# Patient Record
Sex: Female | Born: 1955 | Race: White | Hispanic: No | Smoking: Never smoker
Health system: Southern US, Community
[De-identification: ages and names within clinical notes are randomized; demographics above are authoritative.]

## PROBLEM LIST (undated history)

## (undated) DIAGNOSIS — E785 Hyperlipidemia, unspecified: Secondary | ICD-10-CM

## (undated) DIAGNOSIS — F329 Major depressive disorder, single episode, unspecified: Secondary | ICD-10-CM

## (undated) DIAGNOSIS — K219 Gastro-esophageal reflux disease without esophagitis: Secondary | ICD-10-CM

## (undated) DIAGNOSIS — E039 Hypothyroidism, unspecified: Secondary | ICD-10-CM

## (undated) DIAGNOSIS — F32A Depression, unspecified: Secondary | ICD-10-CM

## (undated) DIAGNOSIS — L720 Epidermal cyst: Secondary | ICD-10-CM

## (undated) HISTORY — DX: Epidermal cyst: L72.0

## (undated) HISTORY — DX: Depression, unspecified: F32.A

## (undated) HISTORY — PX: INNER EAR SURGERY: SHX679

## (undated) HISTORY — DX: Hypothyroidism, unspecified: E03.9

## (undated) HISTORY — DX: Major depressive disorder, single episode, unspecified: F32.9

## (undated) HISTORY — DX: Gastro-esophageal reflux disease without esophagitis: K21.9

## (undated) HISTORY — DX: Hyperlipidemia, unspecified: E78.5

---

## 1998-02-01 ENCOUNTER — Inpatient Hospital Stay (HOSPITAL_COMMUNITY): Admission: EM | Admit: 1998-02-01 | Discharge: 1998-02-08 | Payer: Self-pay | Admitting: Emergency Medicine

## 2000-02-01 ENCOUNTER — Inpatient Hospital Stay (HOSPITAL_COMMUNITY): Admission: EM | Admit: 2000-02-01 | Discharge: 2000-02-04 | Payer: Self-pay | Admitting: *Deleted

## 2000-04-07 ENCOUNTER — Inpatient Hospital Stay (HOSPITAL_COMMUNITY): Admission: EM | Admit: 2000-04-07 | Discharge: 2000-04-11 | Payer: Self-pay | Admitting: *Deleted

## 2000-06-12 ENCOUNTER — Other Ambulatory Visit: Admission: RE | Admit: 2000-06-12 | Discharge: 2000-06-12 | Payer: Self-pay | Admitting: Gynecology

## 2000-06-18 ENCOUNTER — Ambulatory Visit (HOSPITAL_COMMUNITY): Admission: RE | Admit: 2000-06-18 | Discharge: 2000-06-18 | Payer: Self-pay | Admitting: Gastroenterology

## 2000-08-14 ENCOUNTER — Other Ambulatory Visit: Admission: RE | Admit: 2000-08-14 | Discharge: 2000-08-14 | Payer: Self-pay | Admitting: Gynecology

## 2000-10-21 ENCOUNTER — Inpatient Hospital Stay (HOSPITAL_COMMUNITY): Admission: EM | Admit: 2000-10-21 | Discharge: 2000-10-28 | Payer: Self-pay | Admitting: *Deleted

## 2000-10-29 ENCOUNTER — Other Ambulatory Visit (HOSPITAL_COMMUNITY): Admission: RE | Admit: 2000-10-29 | Discharge: 2000-11-10 | Payer: Self-pay | Admitting: Psychiatry

## 2000-12-16 ENCOUNTER — Inpatient Hospital Stay (HOSPITAL_COMMUNITY): Admission: EM | Admit: 2000-12-16 | Discharge: 2000-12-24 | Payer: Self-pay | Admitting: Psychiatry

## 2001-01-21 ENCOUNTER — Inpatient Hospital Stay (HOSPITAL_COMMUNITY): Admission: AD | Admit: 2001-01-21 | Discharge: 2001-01-30 | Payer: Self-pay | Admitting: Psychiatry

## 2001-05-02 ENCOUNTER — Encounter: Payer: Self-pay | Admitting: Emergency Medicine

## 2001-05-02 ENCOUNTER — Emergency Department (HOSPITAL_COMMUNITY): Admission: EM | Admit: 2001-05-02 | Discharge: 2001-05-02 | Payer: Self-pay | Admitting: Emergency Medicine

## 2002-04-21 ENCOUNTER — Encounter: Payer: Self-pay | Admitting: Gynecology

## 2002-04-21 ENCOUNTER — Other Ambulatory Visit: Admission: RE | Admit: 2002-04-21 | Discharge: 2002-04-21 | Payer: Self-pay | Admitting: Gynecology

## 2002-04-21 ENCOUNTER — Encounter: Admission: RE | Admit: 2002-04-21 | Discharge: 2002-04-21 | Payer: Self-pay | Admitting: Gynecology

## 2002-06-24 HISTORY — PX: CHOLECYSTECTOMY: SHX55

## 2002-07-04 ENCOUNTER — Observation Stay (HOSPITAL_COMMUNITY): Admission: EM | Admit: 2002-07-04 | Discharge: 2002-07-05 | Payer: Self-pay | Admitting: Emergency Medicine

## 2002-07-04 ENCOUNTER — Encounter: Payer: Self-pay | Admitting: Emergency Medicine

## 2002-07-04 ENCOUNTER — Encounter (INDEPENDENT_AMBULATORY_CARE_PROVIDER_SITE_OTHER): Payer: Self-pay | Admitting: Specialist

## 2002-07-04 ENCOUNTER — Encounter: Payer: Self-pay | Admitting: General Surgery

## 2004-05-11 ENCOUNTER — Emergency Department (HOSPITAL_COMMUNITY): Admission: EM | Admit: 2004-05-11 | Discharge: 2004-05-11 | Payer: Self-pay | Admitting: Emergency Medicine

## 2004-11-22 ENCOUNTER — Other Ambulatory Visit: Admission: RE | Admit: 2004-11-22 | Discharge: 2004-11-22 | Payer: Self-pay | Admitting: Gynecology

## 2004-12-05 ENCOUNTER — Encounter: Admission: RE | Admit: 2004-12-05 | Discharge: 2004-12-05 | Payer: Self-pay | Admitting: Gynecology

## 2006-06-04 ENCOUNTER — Ambulatory Visit (HOSPITAL_BASED_OUTPATIENT_CLINIC_OR_DEPARTMENT_OTHER): Admission: RE | Admit: 2006-06-04 | Discharge: 2006-06-04 | Payer: Self-pay | Admitting: Orthopedic Surgery

## 2006-11-24 HISTORY — PX: CERVICAL POLYPECTOMY: SHX88

## 2006-12-04 ENCOUNTER — Encounter: Admission: RE | Admit: 2006-12-04 | Discharge: 2006-12-04 | Payer: Self-pay | Admitting: Family Medicine

## 2010-07-30 LAB — HM COLONOSCOPY

## 2010-08-10 NOTE — Discharge Summary (Signed)
[facility]  Patient:    Victoria Morales, Victoria Morales                        MRN: [id number] Adm. Date:  [id number] Disc. Date: [id number] Attending:  Otilio Saber                           Discharge Summary  HISTORY OF PRESENT ILLNESS:  Ms. Wolden is a 55 year old single white female admitted with a history of increasing depression and suicidal ideation.  The patient had a one- to two-year history of difficulties at work and she had become increasingly afraid of being fired.  She had been demoted as Merchandiser, retail two months ago and had been removed from the executive E-mail list within two weeks prior to admission.  She became increasingly anxious and depressed with decreased appetite, increased emotional lability, increased irritability, and crying spells.  She developed hypersomnia and increasing suicidal ideation.  PSYCHIATRIC HISTORY:  Patient had been diagnosed in the late 1980s with severe depression and was treated at [facility] with ECT.  She had multiple admissions to [facility], ______ , [facility], [facility] and was admitted to [facility] in 2001.  She was followed by Dr. Andee Poles, psychiatrist, and Roosvelt Maser who did ___ with her.  She also sees Doran Heater for individual therapy intermittently.  PAST MEDICAL HISTORY:  She was followed medically by Urgent Care.  She had a history of hypothyroidism and gastroesophageal reflux disorder.  MEDICATIONS:  At the time of admission, she was on Synthroid 0.05 mg q.d., Klonopin 1 to 2 mg t.i.d., Xanax 0.5 to 1 mg p.r.n., Neurontin 300 mg t.i.d., Paxil 20 mg q.d., Anafranil 100 mg q.h.s., Skelaxin 400 mg p.r.n., BuSpar 30 mg b.i.d., and Prevacid 30 mg b.i.d.  ALLERGIES:  She had no known drug allergies.  PHYSICAL EXAMINATION ON ADMISSION:  Performed at [facility] with no significant findings.  MENTAL STATUS EXAMINATION:  Overweight white  female who was fidgeting and restless.  Speech was soft and rapid.  Thought processes logical and coherent without evidence of psychosis.  She had suicidal ideation.  Mood was depressed.  Affect was anxious and blunted.  Oriented x 3.  Cognitive function was intact.  ADMITTING DIAGNOSES: Axis I:     Major depression, recurrent, severe. Axis II:    Deferred. Axis III:   1. Hypothyroidism.             2. Gastroesophageal reflux disorder. Axis IV:    Psychosocial stressors moderate. Axis V:     Global Assessment of Functioning current 25, highest past year 65.  LABORATORY FINDINGS:  Admission CBC was normal.  Blood chemistries were normal.  Thyroid panel showed an elevated TSH of 8.54.  Urinalysis was normal. Urine drug screen was negative except for benzodiazepines.  HOSPITAL COURSE:  Patient admitted to [facility] for treatment of her depression.  We elected to continue her on Paxil and Anafranil and added Seroquel to help with her anxiety and agitation.  She continued to remain anxious and was obsessing about her concerns with work.  She felt rather overwhelmed but did seem to calm somewhat with the Seroquel.  She gradually showed improvement in her mood and she became calmer and more stable.  We had increased her Paxil which she tolerated well.  She was denying any further suicidal ideation and was feeling much  better about her situation with work and home.  It is felt that she could be managed again on an outpatient basis.  CONDITION ON DISCHARGE:  Patient discharged in improved condition with improvement in her mood, sleep and appetite, and alleviation of her suicidal ideation.  DISPOSITION:  Patient discharged home.  FOLLOWUP:  She is to follow up with Dr. Andee Poles on April 21, 2000 and with Roosvelt Maser on April 16, 2000.  She is also to meet with Felix Pacini on April 15, 2000.  DISCHARGE MEDICATIONS: 1. Paxil 30 mg q.a.m. 2. Anafranil 100 mg  q.h.s. 3. Klonopin 2 mg b.i.d. 4. Neurontin 300 mg t.i.d. 5. BuSpar 15 mg b.i.d. 6. Seroquel 25 mg q.i.d. 7. Protonix 40 mg a.c. b.i.d. 8. Synthroid 0.05 mg q.d.  FINAL DIAGNOSES: Axis I:     Major depression, recurrent, severe. Axis II:    No diagnosis. Axis III:   1. Hypothyroidism.             2. Gastroesophageal reflux disorder. Axis IV:    Psychosocial stressors moderate. Axis V:     Global Assessment of Functioning current 51, highest past year 65. DD:  05/02/00 TD:  05/03/00 Job: [id number]

## 2010-08-10 NOTE — H&P (Signed)
[facility]  Patient:    Victoria Morales, Victoria Morales Visit Number: [id number] MRN: [id number]          Service Type: PSY Location: 30 1027 25 Attending Physician:  Rachael Fee Dictated by:   Young Berry Scott, N.P. Admit Date:  12/16/2000                     Psychiatric Admission Assessment  REVIEW OF SYSTEMS:  This is an obese middle-aged Caucasian female who is in not acute distress.  She is well-nourished, well-developed, and otherwise appears healthy.  Hygiene is good.  She is fully alert and cooperative with the exam, with no specific somatic complaints today, other than she does feel like she has some motor restlessness in her legs and states "My legs feel like they are moving on their own and sometimes I feel like I have a tremor in my hands, even though you cant see it."  Patients preventive care is up to date with regular mammograms and pap smears.  Patient however reports that she has not had an eye exam in probably more than 10 years.  She has a healing abrasion no her right knee from falling over a pallet at work, and this appears to be healing well.  VITAL SIGNS ON ADMISSION:  Temp 97.4, pulse 107, respirations 20, blood pressure 167/73.  She is 209 pounds and is 5 feet 4-1/2 inches tall.  She does report a history of "borderline hypertension" as reported by the nurse at her work site.  Patient has a history of gastroesophageal reflux disease for which she takes Nexium every day.  PHYSICAL EXAMINATION: SKIN:  Pale in tone with no remarkable scars or features, no tattoos.  Hair is medium brown, cut short up around her ears.  Hair distribution is normal for age and sex.  HEAD:  Normocephalic, no lesions or masses, held upright.  EENT:  PERRLA.  Vessels on bilateral fundi exam show no changes. EACs are patent and TMs show normal light reflex, no injection.  No rhinorrhea.  Oral pharynx is noninjected.  Teeth show evidence of dental work and are  in excellent condition, no breath odor.  Tongue shows fine fasciculations on projection.  NECK:  Supple, no thyromegaly.  CHEST:  Symmetrical.  CARDIOVASCULAR:  S1 and S2 heard, no clicks, murmurs, or gallops, regular rate and rhythm.  LUNGS:  Clear to auscultation.  BREAST EXAM:  Deferred.  ABDOMEN:  Rounded, soft, no masses or tenderness.  Bowel sounds present in all 4 quadrants.  No CVA tenderness, no suprapubic tenderness.  No symptoms of dysuria.  GENITALIA:  Deferred.  MUSCULOSKELETAL:  Gait is normal, posture is upright, spine is straight without tenderness to palpation.  Strength is 5/5 throughout.  Patient is independent in ADLs, ambulates without assistance or devices.  NEURO:  EOMs are intact, with one beat of nystagmus in all directions. Cranial nerves 2-12 are intact and tongue shows fine fasciculations.  No motor tremor is perceptible in hands or arms, however patient does perceive it subjectively.  Deep tendon reflexes are 2+/5.  Cerebellar function is intact for heel-to-shin maneuvers, and Romberg is without findings.  ASSESSMENT:  Patient may be having some mild EPS and we will start some Benadryl on a p.r.n. basis to see if this gives her any relief. Dictated by:   Young Berry Scott, N.P. Attending Physician:  Rachael Fee DD:  12/18/00 TD:  12/18/00 Job: [id number]
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
## 2010-08-10 NOTE — H&P (Signed)
[facility]  Patient:    TOLA, MEAS                        MRN: [id number] Adm. Date:  [id number] Disc. Date: [id number] Attending:  Denny Peon Dictator:   Landry Corporal, N.P.                         History and Physical  IDENTIFYING DATA:  This is a 55 year old single white female admitted on a voluntarily on July 30 for depression, increased anxiety, and suicidal thoughts.  VITAL SIGNS:  She is 5 feet 5 inches tall.  She weighs 206 pound.  Last vital signs:  96.6, heart rate 90, 24 respirations, blood pressure 126/81. GENERAL appearance:  Patient is a 55 year old Caucasian female in no acute distress.  She is obese in stature, appears her stated age.  She is well groomed, alert and cooperative.  HEAD:  Normocephalic.  She can raise her eyebrows.  Her hair is short and of equal distribution.  Her EOMs are intact bilaterally .  Her external ear canals are patent.  Her hearing is appropriate to conversation.  No sinus tenderness, no nasal discharge.  Mucosa is moist, good dentition.  No lesions were seen.  Her tongue protrudes midline without tremor.  She can clench her teeth and puff out her cheeks.  Uvula midline.  NECK:  Supple, full range of motion.  There is no JVD.  Negative lymphadenopathy.  Trachea is midline.  Thyroid is nonpalpable and nontender.  CHEST:  Clear to auscultation.  No adventitious sounds. No cough.  CARDIOVASCULAR:  Heart rate regular rate and rhythm without murmurs, gallops or rubs.  No edema was noted.  BREAST EXAM was deferred.  ABDOMEN:  Soft, obese, nontender abdomen.  Active bowel sounds present.  No CVA tenderness.  MUSCULOSKELETAL:  No joint swelling or deformity.  Good range of motion. Muscle strength and tone is equal bilaterally.  There are no signs of injury.  SKIN:  Warm and dry.  Good turgor.  Nail beds are pink with good capillary refill.  Patient does have some swelling to her right scapular  area that is moveable, nontender, no drainage noted.  NEUROLOGIC:  Oriented x 3.  Cranial nerves are grossly intact.  Good grip strength bilaterally.  No involuntary movements.  Gait is normal.  Cerebellar function is intact.  Romberg is negative.  HEALTH MAINTENANCE ISSUES were addressed. DD:  10/28/00 TD:  10/29/00 Job: [id number]

## 2010-08-10 NOTE — Discharge Summary (Signed)
[facility]  Patient:    CORTASIA, SCREWS                        MRN: [id number] Adm. Date:  [id number] Disc. Date: [id number] Attending:  Otilio Saber                           Discharge Summary  BRIEF HISTORY:  Ms. Maietta is a 55 year old single white female admitted with a history of depression and suicidal ideation.  The patient apparently had been demoted at work and had become increasingly depressed.  She reported feeling worthless and began having suicidal thoughts.  She stated she had been quite worried about getting fired and had felt very stressed and panicky.  She had a history of self-mutilation and previous suicide attempt.  PAST PSYCHIATRIC HISTORY:  The patient had a history of multiple psychiatric hospitalizations, approximately 10, with her last hospitalization being in 3/00 at [facility].  She had previously been to [facility], [facility] in [place], [facility], and was in outpatient therapy at [facility] doing DBT.  She was followed locally by Dr. Phillip Heal and saw Dr. Lottie Dawson, psychologist, for individual therapy.  PAST MEDICAL HISTORY:  She was [facility] medically through [facility]. She has a history of gastroesophageal reflux disease.  At the time of admission she was on Risperdal 1 mg b.i.d., Xanax 0.5 to 1 mg t.i.d. p.r.n., BuSpar 30 mg b.i.d., Anafranil 150 mg q.h.s., Prevacid 30 mg b.i.d., Klonopin 2 mg t.i.d., Depakote ER 1500 mg q.a.m., Neurontin 100 mg b.i.d., Provigil 400 mg q.d.  She reported being allergic to PENICILLIN.  PHYSICAL EXAMINATION:  Performed at [facility] with no significant findings.  MENTAL STATUS EXAMINATION ON ADMISSION:  Overweight white female who was casually dressed.  Speech was normal but slightly pressured.  Thought processes were logical and coherent without evidence of psychosis.  Now denying suicidal ideation.  Mood was anxious.  Affect was anxious.   Oriented x 3.  Cognitive functioning was intact.  ADMITTING DIAGNOSES: Axis I:    Major depression, recurrent with suicidal ideation. Axis II:   Borderline personality disorder. Axis III:  Gastroesophageal reflux disease. Axis IV:   Psychosocial stressors: Severe. Axis V:    Global assessment of functioning current was 40, highest this past            year 60.  LABORATORY FINDINGS ON ADMISSION:  CBC was normal.  Blood chemistries were normal.  Thyroid panel showed mildly elevated TSH 7.57.  Valproic acid was low at 39.5.  Clomipramine level was low at 92.  HOSPITAL COURSE:  The patient was admitted to the [facility] for treatment of her depression to prevent any suicidal acting out.  She had her Depakote decreased and we increased her Neurontin.  She felt less sedated with the decreased dose of Depakote.  She was able to talk about her issues related to work and felt that she was coping better with her issues.  She continued to stabilize and it was felt that she could again be managed on an outpatient basis.  CONDITION ON DISCHARGE:  The patient was discharged in improved condition with improvement in her mood, sleeping and appetite, alleviation of any suicidal ideation, alleviation of any thoughts of self-mutilation.  DISPOSITION:  The patient was discharged home.  FOLLOWUP: 1. Dr. Phillip Heal on 02/12/00. 2. Dr. Bradley Ferris on 02/05/00.  3. Aundra Dubin for DBT on 02/06/00.  DISCHARGE MEDICATIONS:  1. Anafranil 50 mg q.h.s.  2. Risperdal 1 mg b.i.d.  3. Xanax 1 mg b.i.d.  4. BuSpar 30 mg b.i.d.  5. Klonopin 2 mg t.i.d.  6. Depakote ER 1000 mg q.a.m.  7. Neurontin 300 mg b.i.d.  8. Synthroid 0.075 mg q.d.  9. Provigil 400 mg q.d. 10. Prevacid 30 mg b.i.d.  FINAL DIAGNOSES: Axis I:    Major depression, recurrent, severe. Axis II:   Borderline personality disorder. Axis III:  Gastroesophageal reflux disease. Axis IV:   Psychosocial stressors:  Severe. Axis V:    Global assessment of functioning current was 50, highest this past            year 60. DD:  03/05/00 TD:  03/05/00 Job: [id number]

## 2010-08-10 NOTE — Discharge Summary (Signed)
[facility]  Patient:    Victoria Morales, Victoria Morales Visit Number: [id number] MRN: [id number]          Service Type: PSY Location: [id number] Attending Physician:  Rachael Fee Dictated by:   Reymundo Poll Dub Mikes, M.D. Admit Date:  01/21/2001 Discharge Date: 01/30/2001                             Discharge Summary  CHIEF COMPLAINT AND PRESENTING ILLNESS:  This was one of multiple admissions to [facility] for this 55 year old female, admitted due to suicidal ideation.  She had a plan of overdosing as she could not take life any longer.  Increasing thoughts of anxiety and depression over the past 2 weeks prior to this admission.  She felt hopeless, helpless, disgusted with herself, hating herself, discouraged with her mental illness and with the relapses.  Has continued anxiety about work and uses Xanax every morning before walking to work.  Continued chronic conflict with her life partner, who she states would not agree to go for counseling with her.  Not able to relax or calm herself down, either at her residence or at work.  She has been obsessively worrying about her job, concerned that she is going to lose her employment and would have no money to pay for benefits.  Denies auditory or visual hallucinations, no homicidal ideas, hopeless, helpless, inability to fall asleep.  PAST PSYCHIATRIC HISTORY:  Dr. Andee Poles manages her medications. Counselor is Mathews Robinsons through work.  Four hospitalizations, July 2002, January 2002, has received ECT.  SUBSTANCE ABUSE HISTORY:  Denies use or abuse of any substances.  MEDICAL HISTORY:  Hypothyroidism and gastroesophageal reflux disease.  MEDICATIONS:  Xanax 1 mg before going to work in the morning, Neurontin 800 3 times a day, Anafranil 200 at bedtime, BuSpar 30 mg twice a day, Seroquel 50 3 times a day and 100 at bedtime, Paxil 40 1-1/2 daily, Synthroid 60 mcg daily, Nexium 40 daily, Klonopin 2  mg twice a day.  MENTAL STATUS EXAMINATION:  Reveals an overweight, anxious female, fully alert, tearful throughout interview, moves her legs.  Speech shows no pressure, normal and relevant, at times rapid pace.  Mood is anxious and agitated and depressed, hopeless, helpless.  Thought process positive for thoughts of self loathing, perseverates and ruminates about work and her relationship.  No evidence of psychosis.  Admits to suicidal ideas but she is willing to contract for safety.  ADMITTING  DIAGNOSES: Axis I:    1. Major depression, recurrent.            2. Anxiety disorder not otherwise specified. Axis II:   No diagnosis. Axis III:  Hypokalemia, hypothyroidism, gastroesophageal reflux disease. Axis IV:   Moderate. Axis V:    Global assessment of function upon admission 30, highest            global assessment of function in past year 60.  COURSE IN THE HOSPITAL:  She was admitted and started on intensive individual and group psychotherapy.  Basic laboratory workup showed CBC to be within normal limits, blood chemistries were within normal limits, TSH was 17.5.  She was kept on her medications and they were modified as follows:  Anafranil was placed back on 200 mg per day, Seroquel 75 3 times a day, 125 at bedtime. Later, we decided to decrease the anafranil to 125 and then 75 mg per day. Wellbutrin SL  150 was added in the morning.  Seroquel was increased to 150 3 times a day and 150 at night.  The hospitalization was characterized by her feeling hopeless, helpless, very tearful, feeling the conflict in the relationship with her partner, and her partners willingness to come and meet with her, giving her a lot of negative feedback in terms of her being on medications.  Did work on Counsellor. October 1, depressed, tearful, overwhelmed.  October 2, she said she was better, denied any active suicidal ideation, thought that the medication might start  working. Was going to pursue further treatment with Dr. Nolen Mu and wanted to continue to see her individual therapist.  Will continue to work on coping skills and stress management.  As she was stable enough that she could be handled at a lower level of care and she endorsed no suicidal ideas, she was discharged for further outpatient followup.  DISCHARGE  DIAGNOSES: Axis I:    1. Major depression, recurrent.            2. Anxiety disorder not otherwise specified. Axis II:   No diagnosis. Axis III:  Hypokalemia, hypothyroidism, and gastroesophageal reflux disease. Axis IV:   Moderate. Axis V:    Global assessment of function upon discharge 60.  DISCHARGE MEDICATIONS: 1. Neurontin 800 3 times a day. 2. Paxil 30 mg 2 daily. 3. Klonopin 2 mg twice a day. 4. Nexium 40 mg daily. 5. Synthroid 75 mcg. 6. BuSpar 30 mg twice a day. 7. Seroquel 150 4 times a day. 8. Anafranil 25 3 at bedtime. 9. Wellbutrin SL 150 in the morning to increased to 150 twice a day.  DISPOSITION:  To follow with Dr. Andee Poles. Dictated by:   Reymundo Poll Dub Mikes, M.D. Attending Physician:  Rachael Fee DD:  02/11/01 TD:  02/14/01 Job: [id number]

## 2010-08-10 NOTE — H&P (Signed)
[facility]  Patient:    Victoria Morales, Victoria Morales Visit Number: [id number] MRN: [id number]          Service Type: PSY Location: 30 [id number] Attending Physician:  Rachael Fee Dictated by:   Young Berry Scott, N.P. Admit Date:  12/16/2000                     Psychiatric Admission Assessment  DATE OF ASSESSMENT:  December 17, 2000, at 10 a.m.  IDENTIFYING INFORMATION:  This is a 55 year old single white female who is a voluntary admission for suicidal ideation.  HISTORY OF PRESENT ILLNESS:  The patient presented herself with suicidal thoughts with plan to overdose and go to sleep so she would not have to face life any longer.  She reports increased symptoms of anxiety and depression over the past two weeks.  Feels hopeless and helpless and says that she is just disgusted with herself and that she hates herself.  She is discouraged with her mental illness and with the relapses.  She has continued anxiety about work and uses Xanax every morning before walking into work.  Also complains of continued chronic conflicts with her life partner who, she states, will not agree to go for counseling with her.  The patient reports that she is not able to relax or calm herself down and feels that her "medicines are out of whack."  She admits that she has been obsessively worrying about her job and is concerned that she is going to lose her employment and not have any money or benefits with which to use to pay for medications.  The patient denies any auditory or visual hallucinations, no homicidal ideation.  She is definitely hopeless and helpless.  She complains of difficulty sleeping at night, inability to fall asleep, with frequent awakenings.  She denies any change in her appetite.  She denies any homicidal ideation.  PAST PSYCHIATRIC HISTORY:  The patient is followed by Dr. Andee Poles and Valinda Hoar, Nurse Practitioner.  Her counselor is Mathews Robinsons.  This is  the patients fourth hospitalization at [facility], with her last being in July 2002, and one previous admission in January 2002, and other previous admissions.  She has received electroconvulsive therapy in the past, specifically, seven treatments in July 2000.  She has a distant history of self-cutting, nothing recently, and no history of prior suicide attempts.  SOCIAL HISTORY:  The patient is single.  No children.  She works at [employer]. and has had responsibilities decreased over the past 1-1/2 years, which concerns her in terms of her job future.  She was diagnosed with depression many years ago and also states that she is diagnosed with OCD.  The patient is currently working on a full-time basis.  She has a stable 12-year relationship with a same-sex individual.  FAMILY HISTORY:  The patient has an aunt with a history of bipolar illness.  ALCOHOL AND DRUG HISTORY:  The patient denies any use of illegal drugs or abuse of prescription drugs.  She is a nonsmoker.  She uses alcohol rarely on social occasions.  MEDICATIONS:  The patient is followed by Dr. Merla Riches at Urgent Care on [address] in [place].  PAST MEDICAL HISTORY: 1. Hypothyroidism. 2. GERD.  MEDICATIONS: 1. Xanax 1 mg, usually daily in the morning prior to going to work. 2. Neurontin 800 mg t.i.d. 3. Anafranil 200 mg at h.s. 4. BuSpar 30 mg b.i.d. 5. Seroquel 50 mg t.i.d. and 100 mg  at h.s. 6. Paxil 40 mg 1-1/2 tablets daily. 7. Synthroid 50 mcg daily. 8. Nexium 40 mg daily. 9. Klonopin 2 mg b.i.d.  ALLERGIES:  PENICILLIN.  PHYSICAL EXAMINATION:  The patients PE is pending.  LABORATORY DATA:  Her potassium is 3.2.  Her UA is negative.  Her CBC is within normal limits, and her CMET is within normal limits other than the potassium.  MENTAL STATUS EXAMINATION:  This is an obese female with an anxious, careful affect.  She is fully alert.  Tearful at times throughout the interview. Constantly jitters  her leg.  Speech shows no pressure at this time, is normal in tone, and is relevant and spontaneous, somewhat rapid-paced.  Mood is anxious and agitated and depressed, definitely hopeless and helpless.  Thought processes positive for thoughts of self-loathing.  She does perseverate on work, discussing constantly what her co-workers think of her and her concerns about this and her fear of losing her job, although she is not actually paranoid.  No evidence of psychosis.  She is positive for suicidal ideation, and she promises safety on the unit.  She does have plan but no intent. Cognitively she is intact.  DIAGNOSES: Axis I:    1. Major depression, recurrent, severe, with agitation.            2. Anxiety disorder not otherwise specified.            3. Rule out obsessive-compulsive disorder. Axis II:   Deferred. Axis III:  1. Hypokalemia.            2. Hypothyroidism.            3. Gastroesophageal reflux disease. Axis IV:   Mild occupational problems with concerns about her own job            performance and her future at working full-time. Axis V:    Current, 30; past year, 60.  PLAN:  The plan is to voluntarily admit the patient to stabilize her mood and to alleviate her suicidal thoughts.  We will get additional notes from Dr. Nolen Mu.  For her hypokalemia we will give her K-Dur 20 mEq daily x 3 and recheck her potassium on December 19, 2000.  We will restart her previous medications and increase her Neurontin to 800 mg b.i.d. and 1200 mg at h.s., and we will increase her Seroquel to 150 mg at h.s.  ESTIMATED LENGTH OF STAY:  Four days. Dictated by:   Young Berry Scott, N.P. Attending Physician:  Rachael Fee DD:  12/17/00 TD:  12/17/00 Job: [phone] [id number]

## 2010-08-10 NOTE — Discharge Summary (Signed)
[facility]  Patient:    Victoria Morales, Victoria Morales Visit Number: [id number] MRN: [id number]          Service Type: PSY Location: [facility] Attending Physician:  Benjaman Pott Dictated by:   Netta Cedars, M.D. Admit Date:  10/29/2000 Discharge Date: 11/10/2000                             Discharge Summary  INTRODUCTION:  Victoria Morales is a 55 year old white single female, who is admitted on voluntary papers because of depression, increased anxiety and suicidal thoughts.  The patient has a long history of depression, being hospitalized two previous times at [facility] and being also in the past treated with ECT treatments.  Last time, seven treatments in July of 2000.  The patient has no history of previous suicidal attempts.  Recent stressors include conflict with patients partner and work-related issues as well as recent accidental death of patients friend.  PAST MEDICAL HISTORY:  The patient has history of GERD and hyperlipidemia. Most recently was on Neurontin, Paxil, Nexium, Seroquel, BuSpar, Anafranil, Klonopin and Xanax on p.r.n. basis.  INITIAL DIAGNOSTIC IMPRESSION: Axis I:    1. Major depression, recurrent.            2. Anxiety disorder not otherwise specified. Axis II:   Borderline personality. Axis III:  1. Gastroesophageal reflux disease.            2. Hyperlipidemia. Axis IV:   Moderate stressors (related to personal problems, grief issues and            work issues). Axis V:    Global Assessment of Functioning upon admission 35; maximum for            past year estimated at 70.  *Has to be served since patient does not have history of substance abuse.  HOSPITAL COURSE:  After admitting to the ward, patient was placed on special observation.  She was able to contract for safety while on the unit.  She was restarted on Neurontin 400 mg three times a day, Paxil 30 mg daily, Xanax 0.5 mg three times a day p.r.n., Anafranil 150 mg  at bedtime, Klonopin 2 mg twice a day, BuSpar 30 mg twice a day and Seroquel 50 mg three times a day.  Due to anxiety and some racing thoughts as well as obsessive worries, I started patient on Zyprexa 2.5 mg at bedtime and increased her Neurontin to 600 mg three times a day.  On August 1, patient still obsessively worried about losing her job, a lot of anxiety and complaining that she has traffic jam in her head and unable to think clearly.  Overall, she seemed to be doing a little bit better.  Zyprexa was, as mentioned before, added to deal with obsessiveness and anxiety.  I had chance to talk to Dr. Nolen Mu, who is patients psychiatrist, who supported this course of treatment.  On October 24, 2000, still extremely anxious, afraid to be discharged, may take overdose of all her pills, still cannot stand turmoil in her head and obsessive worries.  I subsequently further increased Seroquel and Zyprexa.  She tolerated increase of medications well without any side effects.  We are talking about scheduling meeting with patients partner. She was very fearful about this meeting and very ambivalent.  Yet, after first cancellation, she agreed to go ahead with meeting on October 27, 2000.  Her partner was very  supportive and promised to be available after patients discharge.  Discussion touched the issue related to preventing hospitalizations and assuring patients safety.  Overall, patient had very positive impression from this meeting.  Prior to the meeting, she had some suicidal thoughts and obsessing constantly about what happens if discharged. The patient was concerned about weight gain related to Zyprexa and I discontinued Zyprexa and increasing, subsequently, Seroquel, Klonopin and Neurontin.  On October 28, 2000, she was much better after family session.  No longer with suicidal thoughts, improved sleep, decreased anxiety, no signs of psychosis, no dangerous ideations.  Obsessive worries  improved.  Obsessive worries decreased markedly.  The patient was agreeable with the plan to discharge her home.  MEDICAL PROBLEMS:  During this hospital stay, patient did not experience medical problems.  PHYSICAL EXAMINATION:  Vital signs were stable with blood pressure 120/80. Normal temperature, respiration rate and pulse.  LABORATORY DATA:  CBC and differential normal.  Chemistry 17 normal.  Liver function tests normal.  During the routine check, patient came about with significantly increased thyroid stimulating hormone 28.7 with decreased free T4.  A telephone consultation was obtained from Dr. Reeves Forth, who was treating the patient previously for symptoms of hypothyroidism.  Apparently, before thyroid function tests got normalized and there was no need for Synthroid.  She recommended to restart Synthroid to previous dose of 50 mcg daily with subsequent follow-up.  At the time of discharge, patient was free from dangerous ideations and no psychosis.  She felt that she improved during the course of hospitalization and her partner had similar opinion.  DISCHARGE DIAGNOSES: Axis I:    1. Major depression, recurrent, moderate to severe.            2. Obsessive-compulsive disorder. Axis II:   Borderline personality disorder. Axis III:  1. Gastroesophageal reflux disease.            2. Hyperlipidemia.            3. Hypothyroidism. Axis IV:   Moderate stressors (problems in relation and occupational problems,            grief issues). Axis V:    Global Assessment of Functioning upon admission 35; maximum for            past year between 65 and 70; upon discharge between 60 and 65.  DISCHARGE MEDICATIONS: 1. BuSpar 30 mg twice a day. 2. Protonix 40 mg, 2 tablets daily. 3. Synthroid 50 mcg daily. 4. Paxil 40 mg, 1-1/2 daily. 5. Anafranil 100 mg at bedtime. 6. Seroquel 50 mg three times a day and 100 mg at bedtime. 7. Klonopin 2 mg three times a day. 8. Neurontin 800 mg  three times a day. 9. Xanax 0.5 mg up to three times a day p.r.n. anxiety.   DISCHARGE INSTRUCTIONS:  She is excused from work until November 05, 2000.  She should check with her family doctor to follow up for thyroid treatment. Should not drive until checked by an adult driver that medication does not hurt her driving skills or abilities.  The patient should call her psychiatrist if problems with medication or recurrence of dangerous thoughts. The patients partner will take responsibility in securing her medication.  FOLLOW-UP:  She has appointment with intensive outpatient program on October 29, 2000 at 9 a.m. and will follow later with Dr. Andee Poles on November 04, 2000 at 2 p.m.  The patient understood instructions and, at the time of discharge, did not have any  dangerous ideations.  In good condition, she was discharged home. Dictated by:   Netta Cedars, M.D. Attending Physician:  Carolanne Grumbling D DD:  12/10/00 TD:  12/11/00 Job: [id number]

## 2010-08-10 NOTE — Op Note (Signed)
Victoria Morales, Victoria Morales                 ACCOUNT NO.:  [id number]   MEDICAL RECORD NO.:  [id number]          PATIENT TYPE:  AMB   LOCATION:  [facility]                         FACILITY:  [facility]   PHYSICIAN:  Marlowe Kays, M.D.  DATE OF BIRTH:  July 16, 1955   DATE OF PROCEDURE:  06/04/2006  DATE OF DISCHARGE:                               OPERATIVE REPORT   PREOPERATIVE DIAGNOSIS:  Right carpal tunnel syndrome.   POSTOPERATIVE DIAGNOSIS:  Right carpal tunnel syndrome.   OPERATION:  Decompression median nerve, right wrist and hand.   SURGEON:  Marlowe Kays, M.D.   ASSISTANT:  Nurse.   ANESTHESIA:  IV regional.   PATHOLOGY AND JUSTIFICATION FOR PROCEDURE:  She actually has bilateral  carpal tunnel syndrome, with right more symptomatic than the left.  Her  condition was exacerbated by recent wrist fracture.  She has typical  carpal tunnel symptoms and positive nerve conduction studies.   PROCEDURE:  Satisfactory regional anesthesia, DuraPrep from midforearm  to fingertips was draped in sterile field.  I marked out a curved  incision along the base of thenar eminence crossing obliquely over the  flexor crease of the wrist into the distal forearm.  The median nerve  was identified at the wrist and was quite bulbous with swelling going up  proximal ward so I opened up the wrist portion of the incision somewhat  more cephalad than I normally would.  Then working distally, she was  found have significant compression of the nerve with some discoloration  in the palm.  This was carefully decompressed and also the branches in  the distal palm.  When I felt like the decompression had been completed,  I irrigated the wound well with sterile saline and closed the skin and  subcutaneous tissue only with interrupted 4-0 nylon mattress sutures.  Betadine Adaptic dry sterile dressing, volar plaster splint were  applied.  Tourniquet was released.  She tolerated the procedure well and  was taken to  recovery in satisfactory with no known complications.           ______________________________  Marlowe Kays, M.D.     JA/MEDQ  D:  06/05/2006  T:  06/07/2006  Job:  [id number]

## 2010-08-10 NOTE — Op Note (Signed)
NAME:  Victoria Morales                           ACCOUNT NO.:  [id number]   MEDICAL RECORD NO.:  [id number]                   PATIENT TYPE:  INP   LOCATION:  0105                                 FACILITY:  [facility]   PHYSICIAN:  Sharlet Salina T. Hoxworth, M.D.          DATE OF BIRTH:  May 30, 1955   DATE OF PROCEDURE:  07/04/2002  DATE OF DISCHARGE:                                 OPERATIVE REPORT   PREOPERATIVE DIAGNOSES:  Cholelithiasis and cholecystitis.   POSTOPERATIVE DIAGNOSES:  Cholelithiasis and cholecystitis.   PROCEDURE:  Laparoscopic cholecystectomy with intraoperative cholangiogram.   SURGEON:  Sharlet Salina T. Hoxworth, M.D.   ASSISTANT:  Gabrielle Dare. Janee Morn, M.D.   ANESTHESIA:  General.   BRIEF HISTORY:  Victoria Morales is a 55 year old white female who presents with  acute right upper quadrant abdominal pain, nausea and vomiting. Gallbladder  ultrasound has shown stones and she is tender in the right upper quadrant.  Laparoscopic cholecystectomy with cholangiogram has been recommended and  accepted. The nature of the procedure, its indications, risks of bleeding,  infection, bile leak and bile duct injury were discussed and understood  preoperatively. She is now brought to the operating room for this procedure.   DESCRIPTION OF PROCEDURE:  The patient was brought to the operating room,  placed in supine position on the operating table and general endotracheal  anesthesia was induced. She received preoperative antibiotics. PAS were in  place. The abdomen was sterilely prepped and draped. Local anesthesia was  used to infiltrate the trocar sites prior to the incisions. A 1 cm incision  was made at the umbilicus, dissection carried down the midline fascia which  was sharply incised for 1 cm and the peritoneum entered under direct vision.  Through a mattress suture of #0 Vicryl, the Hasson trocar was placed and  pneumoperitoneum established. Under direct vision, a 10 mm trocar was  placed  in the subxiphoid area and two 5 mm trocars along the right subcostal  margin. The gallbladder was visualized which was edematous and somewhat  inflamed. The fundus was grasped and elevated up over the liver and the  infundibulum retracted inferolaterally. Fibrofatty tissue was stripped down  off the neck of the gallbladder toward the porta hepatis. Calot's triangle  in the distal gallbladder was thoroughly dissected. The cystic artery was  identified coursing up on the gallbladder wall and was divided between three  proximal and one distal clips. The cystic duct was then exposed over about a  centimeter and the cystic duct gallbladder junction dissected 360 degrees.  The cystic duct was clipped at the gallbladder junction and operative  cholangiogram obtained through the cystic duct. This showed good filling of  a normal size common bile duct and intrahepatic ducts with free flow into  the duodenum and no filling defects. Following this, the cystic duct was  doubly clipped proximally and divided. The gallbladder was dissected free  from its base  using hook cautery and removed through the umbilicus. Complete  hemostasis was assured in the right upper quadrant, irrigated and suctioned  until clear. Trocars were removed under direct vision. All CO2 was evacuated  from the peritoneal cavity. The mattress suture was secured at the umbilicus  and skin incisions were closed with interrupted subcuticular 4-0 Monocryl  and Steri-Strips. Sponge, needle and instrument counts were correct. Dry  sterile dressings were applied and the patient taken to the recovery room in  good condition.                                               Victoria Morales. Hoxworth, M.D.    Victoria Morales  D:  07/04/2002  T:  07/04/2002  Job:  [id number]
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
## 2010-08-10 NOTE — H&P (Signed)
NAME:  Victoria Morales, Victoria Morales                           ACCOUNT NO.:  [id number]   MEDICAL RECORD NO.:  [id number]                   PATIENT TYPE:  INP   LOCATION:  0105                                 FACILITY:  [facility]   PHYSICIAN:  Sharlet Salina T. Hoxworth, M.D.          DATE OF BIRTH:  Sep 18, 1955   DATE OF ADMISSION:  07/04/2002  DATE OF DISCHARGE:                                HISTORY & PHYSICAL   CHIEF COMPLAINT:  Right upper quadrant abdominal pain.   HISTORY OF PRESENT ILLNESS:  Victoria Morales is a 55 year old white female with a  12-hour history of severe persistent right upper quadrant abdominal pain and  nausea. She presented to the [facility]. She has had  intermittent episodes of this pain for a couple of months gradually  worsening in severity and frequency. She has had nausea and some vomiting. -  o change in her bowel habits, melena or hematochezia. No apparent  alleviating or exacerbating factors.   PAST MEDICAL HISTORY:  She has had surgery on her ear. No other operations.  She is treated medically for GERD and depression. Mild arthritis and  hypothyroidism.   MEDICATIONS:  1. Nexium 40 mg daily.  2. Seroquel 150 mg b.i.d.  3. Anafranil 50 mg q.h.s.  4. Wellbutrin SR 150 mg daily.  5. Celebrex 200 mg a day.  6. Synthroid 0.1 mg daily.   ALLERGIES:  PENICILLIN.   SOCIAL HISTORY:  Single. Negative cigarettes or alcohol.   FAMILY HISTORY:  Noncontributory.   REVIEW OF SYMPTOMS:  GENERAL:  No fever chills, weight loss. RESPIRATORY:  No shortness of breath __________ cough. CARDIAC:  No chest pain,  palpitations, leg swelling. GI:  As above. GU:  No urinary burning or  frequency. ORTHOPEDIC:  Has some chronic pain in her hands from apparent  repetitive stress injury.   PHYSICAL EXAMINATION:  VITAL SIGNS:  Temperature is 97.4, pulse 81,  respirations 20, blood pressure 131/84.  GENERAL:  Very mildly obese white female in no acute distress.  SKIN:  Warm and  dry without rash or infection.  HEENT:  No scleral icterus, no masses or thyromegaly.  LUNGS:  Clear to auscultation.  CARDIAC:  Regular rate and rhythm without murmurs or gallops. No JVD or  peripheral edema.  ABDOMEN:  Very tender in the right upper quadrant epigastrium without  palpable masses or hepatosplenomegaly.  EXTREMITIES:  No deformity or joint swelling.  NEUROLOGIC:  Alert and fully oriented. Mental status exam is grossly normal.   ADMISSION LABORATORIES:  White count was 7.4000, hemoglobin 13, platelets  288. Urinalysis negative. Electrolytes normal. LFTs abnormal for SGOT of 53.  SGPT, alkaline phosphatase and bilirubin normal. Lipase normal.   Gallbladder ultrasound obtained through the emergency room showed multiple  gallstones.   ASSESSMENT/PLAN:  Acute abdominal pain consistent with cholecystitis and  ultrasound confirming gallstones. The patient will be admitted for urgent  laparoscopic cholecystectomy.  Lorne Skeens. Hoxworth, M.D.    Tory Emerald  D:  07/04/2002  T:  07/04/2002  Job:  [id number]

## 2010-08-10 NOTE — H&P (Signed)
[facility]  Patient:    Victoria Morales, Victoria Morales                        MRN: [id number] Adm. Date:  [id number] Attending:  Otilio Saber Dictator:   Valinda Hoar, N.P.                         History and Physical  IDENTIFYING INFORMATION:  Ms. Boulais is a 55 year old white single female admitted on a voluntary basis on January 31, 2000 for depression with suicidal thoughts.  HISTORY OF PRESENT ILLNESS:  The patient was demoted at work several days ago, i.e., last Friday.  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  She states she has been depressed for two years, however, she got more depressed after being demoted.  She reports someone told her she was not putting in her best effort and she felt like being demoted and what someone said brought on the suicidal thoughts.  It made her feel worthless.  Recently, she has been worse.  She states "I have been trying to be a supervisor, but not fitting in."  Sleep is good.  Appetite is good.  She denies current suicidal thoughts. She feels stressed out and panicky.  She states she is paranoid at work because she thinks she might get fired and now she feels like she overreacted. She denies auditory and visual hallucinations.  Concentration is good.  She feels less depressed this morning than she did yesterday when she was hospitalized.  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  PAST PSYCHIATRIC HISTORY:  The patient has had multiple psychiatric hospitalizations, at least over 10.  Her last hospitalization was March 2000 at [facility].  She has been to [facility] [place], [facility] and currently is involved at [facility] doing DVT.  Dr. Phillip Heal is her private psychiatrist who she has  seen 9-10 years.  She last saw her two weeks ago.  Dr. Zachery Dauer is a psychologist that does individual therapy in  additional to her DVT at [facility].  PAST MEDICAL HISTORY:  The patients primary care doctor is the Urgent Care center.  She was last seen there in the summer of 2001.  Medical problems - She is basically in good health but she does have gastroesophageal reflux disease.  CURRENT MEDICATIONS:  Risperdal 1 mg p.o. b.i.d., Xanax 1 mg p.o. 1/2 to 1 tablet p.r.n. t.i.d., BuSpar 30 mg p.o. b.i.d., Enafranil 150 mg p.o. q.h.s., Prevacid 30 mg p.o. b.i.d., Klonopin 2 mg t.i.d., Depakote ER 500 mg 3 tabs in the morning p.o., Neurontin 100 mg p.o. b.i.d.  Sometimes she takes it ti.d. if she gets extremely nervous.  Provigil 200 mg 2 a day.  DRUG ALLERGIES:  PENICILLIN gives her hives.  SOCIAL HISTORY:  The patient has been in a significant relationship for 11 years.  She states there have been some problems with the relationship but she feels like they are working out.  She feels like she has been a more a problem to her roommate than her.  She is single.  She lives in [place] with her significant other. Her parents live in [place], [place].  She has two sisters who she sees occasionally.  She has a Scientist, water quality in Geophysical data processor and a minor in secondary education from  [employer].  She currently works at [employer] and has been there for 6-1/2 years.  She does complain of having tight financial problems.  FAMILY HISTORY:  Mother and maternal aunt have bipolar disorder.  ALCOHOL AND DRUG HISTORY:  She states she drinks occasionally, averages one drink every three months.  She denies substance abuse, is a nonsmoker.  POSITIVE PHYSICAL FINDINGS:  Please see physical examination done at [facility] ED on January 31, 2000.  There were no significant findings while she was at [facility].  VITAL SIGNS:  Pulse 91, respirations, blood pressure 132/95, she weighs 198 pounds and is 5 feet 5 inches tall.  CURRENT MENTAL STATUS EXAMINATION:  An overweight,  Caucasian female who is casually dressed.  She is cooperative.  Speech is normal, slightly pressured but is relevant.  Mood is anxious.  Affect is anxious.  She was suicidal yesterday, however, she denies suicidal ideation today.  No homicidal ideation.  Thought processes are logical and coherent with no evidence of psychosis.  No hallucinations, no delusions.  Cognitively, she is alert and oriented.  Cognitive functioning is intact.  She has poor judgment, poor impulse control.  CURRENT DIAGNOSES: Axis I:    Major depression, recurrent with suicidal ideation. Axis II:   Borderline personality disorder. Axis III:  Gastroesophageal reflux disease. Axis IV:   Severe, problems with social environment, occupational problems. Axis V:    Current global assessment of functioning 40, highest in past year            60.  TREATMENT PLAN AND RECOMMENDATIONS: 1. Voluntary admission to [facility] Unit. 2. Will check her every 15 minutes, maintain safety. The patient is able to    contract for safety. 3. We will continue her current medications, including Risperdal 1 mg p.o.    b.i.d., Xanax 1 mg p.o. b.i.d., BuSpar 30 mg p.o. b.i.d., Enafranil 50 mg    p.o. 3 q.h.s., Prevacid 30 mg p.o. b.i.d., Klonopin 2 mg p.o. t.i.d.  We    did decrease the Depakote ER to 500 mg q.a.m. instead of 3 tablets in the    a.m.  We increased her Neurontin to 100 mg 3 p.o. q.d. instead of the    Neurontin 100 mg b.i.d. and one occasionally as needed. 4. Our goal will be to make sure she can be safe and safe when she goes home.    Once we feels like she is safe, she has a strong support group in her    roommate and her therapist at [facility], her psychiatrist in [place] plus her    therapist here in [place].  TENTATIVE LENGTH OF STAY AND DISCHARGE PLAN:  Three days. DD:  02/01/00 TD:  02/02/00 Job: [id number]

## 2010-08-10 NOTE — Discharge Summary (Signed)
[facility]  Patient:    Victoria Morales, Victoria Morales Visit Number: [id number] MRN: [id number]          Service Type: PSY Location: 500 4782 02 Attending Physician:  Rachael Fee Dictated by:   Jeanice Lim, M.D. Admit Date:  01/21/2001 Discharge Date: 01/30/2001                             Discharge Summary  IDENTIFYING DATA:  This is a 55 year old single Caucasian female voluntarily admitted, referred by her therapist, Felix Pacini, after stepping in front of a car and apparently looking for a gun, reporting suicidal ideation with several plans.  PAST PSYCHIATRIC HISTORY:  Significant for being followed by Dr. Charlies Silvers.  Felix Pacini is her therapist.  Admitted to [facility] at least five times within the past 12 months with a history of more than 10 admissions to [facility]. Also with a history of ECT treatments approximately 2-3 years ago and has had DBT training in the past.  MEDICATIONS:  Synthroid, Anafranil, Paxil, Neurontin, Seroquel, BuSpar, Klonopin, Nexium and Wellbutrin.  DRUG ALLERGIES:  PENICILLIN.  PHYSICAL EXAMINATION:  Essentially within normal limits.  Neurologically nonfocal.  LABORATORY DATA:  Within normal limits including a thyroid profile, liver function tests, CBC with differential, urinalysis and urine drug screen negative.  Anafranil level was within low-therapeutic range.  MENTAL STATUS EXAMINATION:  The patient was alert and oriented, maintaining poor eye contact, mildly agitated, tearful throughout the interview.  Affect was labile.  Thought process goal directed, ruminating on suicidal ideation and having failed past attempts to get better.  Thought content positive for suicidal ideation with several plans.  Contracting for safety on the unit. Denied psychotic symptoms other than cognitive distortions related to interpersonal relations.  Cognitively  intact.  ADMISSION DIAGNOSES: Axis I:    1. Major depression, recurrent, severe.            2. Post-traumatic stress disorder. Axis II:   Borderline personality disorder. Axis III:  1. Hypothyroidism.            2. Gastroesophageal reflux disease. Axis IV:   Moderate (problems with primary support group and occupational            environment). Axis V:    30/62.  HOSPITAL COURSE:  The patient was admitted on routine p.r.n. medications and observed for safety.  She received individual, group and milieu therapy and met with her therapist on several occasions during hospitalization. Dr. Nolen Mu was informed, at the time of admission, regarding the patients status and adjustments in medications were made related to mood lability and agitation, optimizing Seroquel, simplifying medications initially, tapering BuSpar and Anafranil while optimizing Wellbutrin.  However, the patient reported feeling ambivalent about this change two days after requesting this change and reported that she felt these medications had helped her and therefore they were re-titrated.  Wellbutrin was adjusted to a tolerable dose and she was monitored medically.  Trileptal was started for mood stabilization since the patient reported side effects to the Depakote and no improvement. She continued to have episodic suicidality, which seemed to be situationally induced.  However, she worked on developing healthier coping mechanisms and a crisis plan to be able to manage feelings of desperateness in the community. She also arranged setting up DBT.  The patient eventually reported good tolerance to medication changes and stabilization of mood  with less lability and anxiety and no agitation.  CONDITION ON DISCHARGE:  Improved.  Mood was mostly euthymic.  Affect full. Thought process goal directed.  Thought content negative for dangerous ideation.  No psychotic symptoms and cognition was intact.  Judgment and insight  improved.  She described a crisis plan and demonstrated a good support system.  Therapist agreed with her being ready to be discharged and to follow up with the intensive outpatient program.  FOLLOW-UP:  The patient was discharged to follow up with the intensive outpatient DBT classes scheduled for February 02, 2001 at 5:30 p.m. and follow up with Dr. Nolen Mu on February 16, 2001 at 4:15 p.m. and Felix Pacini on February 03, 2001.  DISCHARGE MEDICATIONS:  1. Anafranil 25 mg, 2 q.h.s.  2. Klonopin 0.5 mg q.a.m., 3 p.m. and 2 q.h.s.  3. Neurontin 400 mg, 2 q.a.m. and 2 q. 3 p.m. and 2 q.h.s.  4. Trilafon 150 mg b.i.d. and 2 q.h.s.  5. Paxil 20 mg, 3 q.a.m.  6. Protonix 40 mg q.a.m.  7. Levothyroxine 75 mcg q.a.m.  8. Seroquel 200 mg, 2 q. 7 a.m., 2 q. 12 p.m., 2 q. 6 p.m. and 2 q.h.s.  9. BuSpar 15 mg, 2 q.a.m. and 2 q. 6 p.m. 10. Wellbutrin SR 100 mg q.a.m. and 2 q. 2 p.m.  DISCHARGE DIAGNOSES: Axis I:    1. Major depression, recurrent, severe.            2. Post-traumatic stress disorder. Axis II:   Borderline personality disorder. Axis III:  1. Hypothyroidism.            2. Gastroesophageal reflux disease. Axis IV:   Moderate (problems with primary support group and occupational            environment). Axis V:    Global Assessment of Functioning on discharge 55. Dictated by:   Jeanice Lim, M.D. Attending Physician:  Rachael Fee DD:  03/11/01 TD:  03/12/01 Job: [id number]

## 2010-08-10 NOTE — Procedures (Signed)
[facility]  Patient:    Victoria Morales, Victoria Morales                        MRN: [id number] Proc. Date: 06/18/00 Adm. Date:  [id number] Attending:  Charna Elizabeth CC:         Charlies Silvers, M.D.   Procedure Report  DATE OF BIRTH:  Nov 04, 1955.  PROCEDURE:  Colonoscopy.  ENDOSCOPIST:  Anselmo Rod, M.D.  INSTRUMENT USED:  Olympus video colonoscope.  INDICATION FOR PROCEDURE:  Rectal bleeding with intermittent constipation in a 55 year old white female with a history of major depression.  Rule out colonic polyps, masses, hemorrhoids, etc.  PREPROCEDURE PREPARATION:  Informed consent was procured from the patient. The patient was fasted for eight hours prior to the procedure and prepped with a bottle of magnesium citrate and a gallon of NuLytely the night prior to the procedure.  PREPROCEDURE PHYSICAL:  VITAL SIGNS:  The patient had stable vital signs.  NECK:  Supple.  CHEST:  Clear to auscultation.  S1, S2 regular.  ABDOMEN:  Soft with normal abdominal bowel sounds.  DESCRIPTION OF PROCEDURE:  The patient was placed in the left lateral decubitus position and sedated with 5 mg of Versed intravenously.  Once the patient was adequately sedate and maintained on low-flow oxygen and continuous cardiac monitoring, the Olympus video colonoscope was advanced from the rectum to the cecum without difficulty.  Small internal hemorrhoids with prominent anal papillae were appreciated on retroflexion.  There was a small external hemorrhoid seen on anal inspection.  No masses, polyps, erosions, ulcerations were present.  The procedure was complete up to the cecum.  Some residual stool was seen throughout the colon; therefore, very small lesions could have been missed.  IMPRESSION: 1. Small internal hemorrhoids with prominent anal papillae. 2. Small external hemorrhoids. 3. No masses or polyps seen. 4. Some residual stool seen in the colon.  Very small  lesions could be missed.  RECOMMENDATIONS: 1. The patient has been advised to increase the fluid and fiber in her diet. 2. Stool softeners are to be used as needed. 3. Outpatient follow-up is advised on a p.r.n. basis. DD:  06/18/00 TD:  06/19/00 Job: [id number]

## 2011-01-16 ENCOUNTER — Ambulatory Visit: Payer: Self-pay

## 2012-01-15 ENCOUNTER — Ambulatory Visit: Payer: Self-pay

## 2013-01-21 ENCOUNTER — Ambulatory Visit: Payer: Self-pay | Admitting: General Practice

## 2014-01-18 ENCOUNTER — Ambulatory Visit: Payer: Self-pay | Admitting: General Practice

## 2015-01-09 ENCOUNTER — Other Ambulatory Visit: Payer: Self-pay | Admitting: Emergency Medicine

## 2015-01-09 DIAGNOSIS — Z1231 Encounter for screening mammogram for malignant neoplasm of breast: Secondary | ICD-10-CM

## 2015-01-10 ENCOUNTER — Ambulatory Visit
Admission: RE | Admit: 2015-01-10 | Discharge: 2015-01-10 | Disposition: A | Discharge status: Home / Self Care | Payer: No Typology Code available for payment source | Source: Ambulatory Visit | Attending: Emergency Medicine | Admitting: Emergency Medicine

## 2015-01-10 DIAGNOSIS — Z1231 Encounter for screening mammogram for malignant neoplasm of breast: Secondary | ICD-10-CM

## 2015-04-27 ENCOUNTER — Telehealth: Payer: Self-pay

## 2015-04-27 NOTE — Telephone Encounter (Signed)
Pt has an appt with chelle on 05/23/15 for a cpe and she has labs from sept is that close enough or will she need to have them at the appt   Best number [phone]

## 2015-04-27 NOTE — Telephone Encounter (Signed)
Chelle she has an appt with you on 2/28 at 10am.  Can you use her labs from Sept from work which was done by [employer].  Those test were chemistries, cholesterol, thyroid, and hemoglobin.

## 2015-04-28 NOTE — Telephone Encounter (Signed)
1. Most likely, yes. If any of those results are abnormal, I may recommend that we repeat them.  2. I note that this message was marked important/urgent. Perhaps by accident? Since the appointment isn't until 2/28, I think normal priority would have been more appropriate.

## 2015-05-01 NOTE — Telephone Encounter (Signed)
Spoke with patient and told her that Victoria Morales most likely can use the blood results from September but if any of the results were abnormal she may have to repeat them.  Patient verbalized understanding.

## 2015-05-08 ENCOUNTER — Telehealth: Payer: Self-pay

## 2015-05-08 NOTE — Telephone Encounter (Signed)
Chelle, I accidentally scheduled this patient for a complete pe and she has never seen [place] before -this was right after the change to go to no more new patient went into effect  And Staci was wanting me to ask you did you want [place] to cancel this appt or are you willing to see this patient please let me know   And I apologize to putting the message about labs as urgent,I trying to get a quick answer in case she needed to come in for labs and be prepared  donna

## 2015-05-08 NOTE — Telephone Encounter (Signed)
Lupita Leash has  let Staci know that Chelle has approved on 05/08/15 at 12:41pm that she will glad to see this patient and notes were made in the appt notes

## 2015-05-08 NOTE — Telephone Encounter (Signed)
Happy to see her. Not a problem. Thanks for letting me know!

## 2015-05-18 ENCOUNTER — Other Ambulatory Visit: Payer: Self-pay | Admitting: Family Medicine

## 2015-05-19 LAB — HGB A1C W/O EAG: HEMOGLOBIN A1C: 5.7 % — AB (ref 4.8–5.6)

## 2015-05-19 LAB — CMP12+LP+TP+TSH+6AC+CBC/D/PLT
A/G RATIO: 1.8 (ref 1.1–2.5)
ALBUMIN: 4.2 g/dL (ref 3.5–5.5)
ALT: 19 IU/L (ref 0–32)
AST: 23 IU/L (ref 0–40)
Alkaline Phosphatase: 78 IU/L (ref 39–117)
BASOS ABS: 0 10*3/uL (ref 0.0–0.2)
BUN/Creatinine Ratio: 18 (ref 9–23)
BUN: 13 mg/dL (ref 6–24)
Basos: 1 %
Bilirubin Total: 0.4 mg/dL (ref 0.0–1.2)
CALCIUM: 9.2 mg/dL (ref 8.7–10.2)
CHOL/HDL RATIO: 2.4 ratio (ref 0.0–4.4)
CHOLESTEROL TOTAL: 149 mg/dL (ref 100–199)
Chloride: 95 mmol/L — ABNORMAL LOW (ref 96–106)
Creatinine, Ser: 0.73 mg/dL (ref 0.57–1.00)
EOS (ABSOLUTE): 0.4 10*3/uL (ref 0.0–0.4)
Eos: 6 %
Estimated CHD Risk: <0.5 times avg. (ref 0.0–1.0)
Free Thyroxine Index: 2.1 (ref 1.2–4.9)
GFR calc Af Amer: 105 mL/min/{1.73_m2} (ref >59)
GFR, EST NON AFRICAN AMERICAN: 91 mL/min/{1.73_m2} (ref >59)
GGT: 21 IU/L (ref 0–60)
GLOBULIN, TOTAL: 2.4 g/dL (ref 1.5–4.5)
Glucose: 86 mg/dL (ref 65–99)
HDL: 61 mg/dL (ref >39)
Hematocrit: 39.4 % (ref 34.0–46.6)
Hemoglobin: 13.2 g/dL (ref 11.1–15.9)
IMMATURE GRANS (ABS): 0 10*3/uL (ref 0.0–0.1)
IRON: 101 ug/dL (ref 27–159)
Immature Granulocytes: 0 %
LDH: 193 IU/L (ref 119–226)
LDL Calculated: 72 mg/dL (ref 0–99)
LYMPHS: 27 %
Lymphocytes Absolute: 1.7 10*3/uL (ref 0.7–3.1)
MCH: 30.3 pg (ref 26.6–33.0)
MCHC: 33.5 g/dL (ref 31.5–35.7)
MCV: 90 fL (ref 79–97)
MONOCYTES: 8 %
MONOS ABS: 0.5 10*3/uL (ref 0.1–0.9)
NEUTROS ABS: 3.6 10*3/uL (ref 1.4–7.0)
Neutrophils: 58 %
PHOSPHORUS: 4.8 mg/dL — AB (ref 2.5–4.5)
PLATELETS: 244 10*3/uL (ref 150–379)
POTASSIUM: 4.6 mmol/L (ref 3.5–5.2)
RBC: 4.36 x10E6/uL (ref 3.77–5.28)
RDW: 13.2 % (ref 12.3–15.4)
Sodium: 135 mmol/L (ref 134–144)
T3 UPTAKE RATIO: 28 % (ref 24–39)
T4 TOTAL: 7.4 ug/dL (ref 4.5–12.0)
TOTAL PROTEIN: 6.6 g/dL (ref 6.0–8.5)
TRIGLYCERIDES: 80 mg/dL (ref 0–149)
TSH: 0.706 u[IU]/mL (ref 0.450–4.500)
Uric Acid: 3.9 mg/dL (ref 2.5–7.1)
VLDL Cholesterol Cal: 16 mg/dL (ref 5–40)
WBC: 6.2 10*3/uL (ref 3.4–10.8)

## 2015-05-19 LAB — CBC AND DIFFERENTIAL
HEMATOCRIT: 39 % (ref 36–46)
Hemoglobin: 13.2 g/dL (ref 12.0–16.0)
PLATELETS: 244 10*3/uL (ref 150–399)
WBC: 6.2 10*3/mL

## 2015-05-19 LAB — LIPID PANEL
Cholesterol: 149 mg/dL (ref 0–200)
HDL: 61 mg/dL (ref 35–70)
LDL CALC: 72 mg/dL
TRIGLYCERIDES: 80 mg/dL (ref 40–160)

## 2015-05-19 LAB — BASIC METABOLIC PANEL
BUN: 13 mg/dL (ref 4–21)
CREATININE: 0.7 mg/dL (ref 0.5–1.1)
GLUCOSE: 86 mg/dL
Potassium: 4.6 mmol/L (ref 3.4–5.3)
Sodium: 135 mmol/L — AB (ref 137–147)

## 2015-05-19 LAB — HEPATIC FUNCTION PANEL
ALK PHOS: 78 U/L (ref 25–125)
ALT: 19 U/L (ref 7–35)
AST: 23 U/L (ref 13–35)
BILIRUBIN, TOTAL: 0.4 mg/dL

## 2015-05-19 LAB — TSH: TSH: 0.71 u[IU]/mL (ref 0.41–5.90)

## 2015-05-19 LAB — HEMOGLOBIN A1C: HEMOGLOBIN A1C: 5.7

## 2015-05-23 ENCOUNTER — Encounter: Payer: Self-pay | Admitting: Physician Assistant

## 2015-05-23 ENCOUNTER — Ambulatory Visit (INDEPENDENT_AMBULATORY_CARE_PROVIDER_SITE_OTHER): Payer: No Typology Code available for payment source | Admitting: Physician Assistant

## 2015-05-23 VITALS — BP 120/80 | HR 99 | Temp 98.1°F | Resp 16 | Ht 63.5 in | Wt 178.0 lb

## 2015-05-23 DIAGNOSIS — Z1159 Encounter for screening for other viral diseases: Secondary | ICD-10-CM

## 2015-05-23 DIAGNOSIS — Z6828 Body mass index (BMI) 28.0-28.9, adult: Secondary | ICD-10-CM | POA: Insufficient documentation

## 2015-05-23 DIAGNOSIS — F418 Other specified anxiety disorders: Secondary | ICD-10-CM

## 2015-05-23 DIAGNOSIS — K219 Gastro-esophageal reflux disease without esophagitis: Secondary | ICD-10-CM | POA: Diagnosis not present

## 2015-05-23 DIAGNOSIS — Z0000 Encounter for general adult medical examination without abnormal findings: Secondary | ICD-10-CM

## 2015-05-23 DIAGNOSIS — Z6829 Body mass index (BMI) 29.0-29.9, adult: Secondary | ICD-10-CM | POA: Insufficient documentation

## 2015-05-23 DIAGNOSIS — E039 Hypothyroidism, unspecified: Secondary | ICD-10-CM | POA: Diagnosis not present

## 2015-05-23 DIAGNOSIS — R010 Benign and innocent cardiac murmurs: Secondary | ICD-10-CM | POA: Diagnosis not present

## 2015-05-23 DIAGNOSIS — Z124 Encounter for screening for malignant neoplasm of cervix: Secondary | ICD-10-CM | POA: Diagnosis not present

## 2015-05-23 DIAGNOSIS — Z23 Encounter for immunization: Secondary | ICD-10-CM

## 2015-05-23 DIAGNOSIS — Z114 Encounter for screening for human immunodeficiency virus [HIV]: Secondary | ICD-10-CM

## 2015-05-23 DIAGNOSIS — Z6831 Body mass index (BMI) 31.0-31.9, adult: Secondary | ICD-10-CM | POA: Diagnosis not present

## 2015-05-23 DIAGNOSIS — E785 Hyperlipidemia, unspecified: Secondary | ICD-10-CM | POA: Diagnosis not present

## 2015-05-23 DIAGNOSIS — F419 Anxiety disorder, unspecified: Secondary | ICD-10-CM | POA: Insufficient documentation

## 2015-05-23 DIAGNOSIS — R011 Cardiac murmur, unspecified: Secondary | ICD-10-CM

## 2015-05-23 DIAGNOSIS — F329 Major depressive disorder, single episode, unspecified: Secondary | ICD-10-CM

## 2015-05-23 DIAGNOSIS — F32A Depression, unspecified: Secondary | ICD-10-CM

## 2015-05-23 LAB — HIV ANTIBODY (ROUTINE TESTING W REFLEX): HIV 1&2 Ab, 4th Generation: NONREACTIVE

## 2015-05-23 LAB — HEPATITIS C ANTIBODY: HCV Ab: NEGATIVE

## 2015-05-23 MED ORDER — SIMVASTATIN 40 MG PO TABS: 40.0000 mg | ORAL_TABLET | Freq: Every day | ORAL | Status: DC

## 2015-05-23 MED ORDER — OMEPRAZOLE 40 MG PO CPDR: 40.0000 mg | DELAYED_RELEASE_CAPSULE | Freq: Every day | ORAL | Status: DC

## 2015-05-23 MED ORDER — LEVOTHYROXINE SODIUM 150 MCG PO TABS: 150.0000 ug | ORAL_TABLET | Freq: Every day | ORAL | Status: DC

## 2015-05-23 NOTE — Progress Notes (Signed)
Patient ID: Victoria Morales, female    DOB: [birth year], 60 y.o.   MRN: [id number]  PCP: Neala Miggins, PA-C  Chief Complaint  Patient presents with  . Annual Exam  . Gynecologic Exam  . Medication Refill    needs Rx printed out    Subjective:   HPI: Presents for annual physical. Brought labs from a screening at work performed 05/18/2015. CMET, Lipids, Thyroid profile and CBC were normal. Hgb A1C 5.7%.  I last saw this patient 05/2008, at which time I performed a complete physical.  Mammogram 12/2014. Colonoscopy 2012. Will get Tdap today.  Patient Active Problem List   Diagnosis Date Noted  . GERD (gastroesophageal reflux disease) 05/23/2015  . Hyperlipidemia 05/23/2015  . Hypothyroidism 05/23/2015  . Anxiety and depression 05/23/2015  . BMI 31.0-31.9,adult 05/23/2015    Past Medical History  Diagnosis Date  . Hyperlipemia   . Depression   . Hypothyroid   . Acid reflux      Prior to Admission medications   Medication Sig Start Date End Date Taking? Authorizing Provider  aspirin EC 81 MG tablet Take 81 mg by mouth daily.   Yes Historical Provider, MD  Calcium Carbonate-Vitamin D (CALCIUM-VITAMIN D) 500-200 MG-UNIT tablet Take 1 tablet by mouth daily.   Yes Historical Provider, MD  clomiPRAMINE (ANAFRANIL) 50 MG capsule Take 50 mg by mouth at bedtime.   Yes Historical Provider, MD  Fish Oil OIL by Does not apply route.   Yes Historical Provider, MD  glucosamine-chondroitin 500-400 MG tablet Take 1 tablet by mouth 3 (three) times daily.   Yes Historical Provider, MD  levothyroxine (SYNTHROID, LEVOTHROID) 100 MCG tablet Take 100 mcg by mouth daily before breakfast. 1 1/2 tab qd   Yes Historical Provider, MD  Multiple Vitamin (MULTIVITAMIN) tablet Take 1 tablet by mouth daily.   Yes Historical Provider, MD  omeprazole (PRILOSEC) 40 MG capsule Take 40 mg by mouth daily.   Yes Historical Provider, MD  PARoxetine (PAXIL) 40 MG tablet Take 40 mg by mouth every morning.   Yes  Historical Provider, MD  QUEtiapine (SEROQUEL) 300 MG tablet Take 300 mg by mouth at bedtime.   Yes Historical Provider, MD  simvastatin (ZOCOR) 40 MG tablet Take 40 mg by mouth daily.   Yes Historical Provider, MD  vitamin C (ASCORBIC ACID) 500 MG tablet Take 500 mg by mouth daily.   Yes Historical Provider, MD    Allergies  Allergen Reactions  . Penicillins     Past Surgical History  Procedure Laterality Date  . Inner ear surgery    . Cervical polypectomy  11/2006  . Cholecystectomy  06/2002    Family History  Problem Relation Age of Onset  . Breast cancer Paternal Aunt 50    2 aunts  . Diabetes Mother   . Heart failure Father     pacemaker  . Diabetes Sister   . Hyperlipidemia Sister   . Hypertension Sister   . Cancer Sister 40    breast cancer    Social History   Social History  . Marital Status: Significant Other    Spouse Name: Jan  . Number of Children: 0  . Years of Education: Masters   Occupational History  . stock adjust, [employer]     Replacements   Social History Main Topics  . Smoking status: Never Smoker   . Smokeless tobacco: Never Used  . Alcohol Use: 0.0 - 1.2 oz/week    0-2 Standard drinks or equivalent per  week  . Drug Use: No  . Sexual Activity:    Partners: Female   Other Topics Concern  . None   Social History Narrative   Lives with partner, Jan since 4.          Review of Systems  Constitutional: Negative.   HENT: Negative.   Eyes: Negative.   Respiratory: Negative.   Cardiovascular: Negative.   Gastrointestinal: Positive for constipation (now taking a stool softener, otherwise only has BM Q3 days. Trying to increase oral hydration. Too much fiber causes increased GERD symptoms.). Negative for nausea, vomiting, abdominal pain, diarrhea, blood in stool, abdominal distention, anal bleeding and rectal pain.       Previous attempts to stop PPI therapy have resulted in recurrent symptoms.  Endocrine: Negative.     Genitourinary: Negative.   Musculoskeletal: Positive for arthralgias (Knee, "it's old age," chondroitin with good relief. Does not limit activites.). Negative for myalgias, back pain, joint swelling, gait problem, neck pain and neck stiffness.  Skin: Negative.        Has a couple of moles on her back she'd like checked.  Allergic/Immunologic: Negative.   Neurological: Negative.   Hematological: Negative.   Psychiatric/Behavioral: Negative.         Objective:  Physical Exam  Constitutional: She is oriented to person, place, and time. Vital signs are normal. She appears well-developed and well-nourished. She is active and cooperative. No distress.  BP 120/80 mmHg  Pulse 99  Temp(Src) 98.1 F (36.7 C)  Resp 16  Ht 5' 3.5" (1.613 m)  Wt 178 lb (80.74 kg)  BMI 31.03 kg/m2   HENT:  Head: Normocephalic and atraumatic.  Right Ear: Hearing, tympanic membrane, external ear and ear canal normal. No foreign bodies.  Left Ear: Hearing, tympanic membrane, external ear and ear canal normal. No foreign bodies.  Nose: Nose normal.  Mouth/Throat: Uvula is midline, oropharynx is clear and moist and mucous membranes are normal. No oral lesions. Normal dentition. No dental abscesses or uvula swelling. No oropharyngeal exudate.  Eyes: Conjunctivae, EOM and lids are normal. Pupils are equal, round, and reactive to light. Right eye exhibits no discharge. Left eye exhibits no discharge. No scleral icterus.  Fundoscopic exam:      The right eye shows no arteriolar narrowing, no AV nicking, no exudate, no hemorrhage and no papilledema.       The left eye shows no arteriolar narrowing, no AV nicking, no exudate, no hemorrhage and no papilledema.  Neck: Trachea normal, normal range of motion and full passive range of motion without pain. Neck supple. No spinous process tenderness and no muscular tenderness present. No thyroid mass and no thyromegaly present.  Cardiovascular: Normal rate, regular rhythm, S1  normal, S2 normal, intact distal pulses and normal pulses.   Murmur heard.  Systolic (heard loudest in the aortic space. Not previously documented in her record.) murmur is present with a grade of 2/6  Pulmonary/Chest: Effort normal and breath sounds normal. She exhibits no tenderness and no retraction. Right breast exhibits no inverted nipple, no mass, no nipple discharge, no skin change and no tenderness. Left breast exhibits no inverted nipple, no mass, no nipple discharge, no skin change and no tenderness. Breasts are symmetrical.  Abdominal: Soft. Normal appearance and bowel sounds are normal. She exhibits no distension and no mass. There is no hepatosplenomegaly. There is no tenderness. There is no rigidity, no rebound, no guarding, no CVA tenderness, no tenderness at McBurney's point and negative Murphy's sign. No  hernia. Hernia confirmed negative in the right inguinal area and confirmed negative in the left inguinal area.  Genitourinary: Rectum normal, vagina normal and uterus normal. Rectal exam shows no external hemorrhoid and no fissure. No breast swelling, tenderness, discharge or bleeding. Pelvic exam was performed with patient supine. No labial fusion. There is no rash, tenderness, lesion or injury on the right labia. There is no rash, tenderness, lesion or injury on the left labia. Cervix exhibits no motion tenderness, no discharge and no friability. Right adnexum displays no mass, no tenderness and no fullness. Left adnexum displays no mass, no tenderness and no fullness. No erythema, tenderness or bleeding in the vagina. No foreign body around the vagina. No signs of injury around the vagina. No vaginal discharge found.  Musculoskeletal: She exhibits no edema or tenderness.       Cervical back: Normal.       Thoracic back: Normal.       Lumbar back: Normal.  Lymphadenopathy:       Head (right side): No tonsillar, no preauricular, no posterior auricular and no occipital adenopathy present.        Head (left side): No tonsillar, no preauricular, no posterior auricular and no occipital adenopathy present.    She has no cervical adenopathy.    She has no axillary adenopathy.       Right: No inguinal and no supraclavicular adenopathy present.       Left: No inguinal and no supraclavicular adenopathy present.  Neurological: She is alert and oriented to person, place, and time. She has normal strength and normal reflexes. No cranial nerve deficit. She exhibits normal muscle tone. Coordination and gait normal.  Skin: Skin is warm, dry and intact. Lesion (scattered nevi and SK lesions. None stand out as suspiscious. Largest is on the RIGHT anterior tibia, unchanged and present "my whole life.") noted. No rash noted. She is not diaphoretic. No cyanosis or erythema. Nails show no clubbing.  Psychiatric: She has a normal mood and affect. Her speech is normal and behavior is normal. Judgment and thought content normal.           Assessment & Plan:  1. Annual physical exam Age appropriate anticipatory guidance provided.  2. Gastroesophageal reflux disease, esophagitis presence not specified Anticipate weight loss will help. She'll try again to reduce the PPI. Will reduce dose to 20 mg using OTC product, and see if that adequately controls her symptoms. Would like to get off and control symptoms with lifestyle, +/_ H2 blocker. - Ambulatory referral to diabetic education - omeprazole (PRILOSEC) 40 MG capsule; Take 1 capsule (40 mg total) by mouth daily.  Dispense: 90 capsule; Refill: 3  3. Hyperlipidemia Controlled.. Adjust statin dose if indicated. - Ambulatory referral to diabetic education - simvastatin (ZOCOR) 40 MG tablet; Take 1 tablet (40 mg total) by mouth daily.  Dispense: 90 tablet; Refill: 3  4. Hypothyroidism, unspecified hypothyroidism type Normal thyroid function. Continue current dose. - levothyroxine (SYNTHROID, LEVOTHROID) 150 MCG tablet; Take 1 tablet (150 mcg total)  by mouth daily before breakfast.  Dispense: 90 tablet; Refill: 3  5. Anxiety and depression Stable. Controlled. Follow-up with psychiatry as recommended.  6. BMI 31.0-31.9,adult - Ambulatory referral to diabetic education  7. Screening for HIV (human immunodeficiency virus) - HIV antibody  8. Need for hepatitis C screening test - Hepatitis C antibody  9. Screening for cervical cancer - Pap IG and HPV (high risk) DNA detection  10. Cardiac murmur, previously undiagnosed She is asymptomatic.  -  Ambulatory referral to Cardiology   Fernande Bras, PA-C Physician Assistant-Certified [facility]

## 2015-05-23 NOTE — Patient Instructions (Addendum)
Because you received labwork today, you will receive an invoice from [facility]. Please contact [facility] at [phone] with questions or concerns regarding your invoice. Our billing staff will not be able to assist you with those questions.  You will be contacted with the lab results as soon as they are available. The fastest way to get your results is to activate your My Chart account. Instructions are located on the last page of this paperwork. If you have not heard from us regarding the results in 2 weeks, please contact this office.  Keeping You Healthy  Get These Tests  Blood Pressure- Have your blood pressure checked by your healthcare provider at least once a year.  Normal blood pressure is 120/80.  Weight- Have your body mass index (BMI) calculated to screen for obesity.  BMI is a measure of body fat based on height and weight.  You can calculate your own BMI at www.nhlbisupport.com/bmi/  Cholesterol- Have your cholesterol checked every year.  Diabetes- Have your blood sugar checked every year if you have high blood pressure, high cholesterol, a family history of diabetes or if you are overweight.  Pap Test - Have a pap test every 1 to 5 years if you have been sexually active.  If you are older than 65 and recent pap tests have been normal you may not need additional pap tests.  In addition, if you have had a hysterectomy  for benign disease additional pap tests are not necessary.  Mammogram-Yearly mammograms are essential for early detection of breast cancer  Screening for Colon Cancer- Colonoscopy starting at age 50. Screening may begin sooner depending on your family history and other health conditions.  Follow up colonoscopy as directed by your Gastroenterologist.  Screening for Osteoporosis- Screening begins at age 65 with bone density scanning, sooner if you are at higher risk for developing Osteoporosis.  Get these medicines  Calcium with Vitamin  D- Your body requires 1200-1500 mg of Calcium a day and 800-1000 IU of Vitamin D a day.  You can only absorb 500 mg of Calcium at a time therefore Calcium must be taken in 2 or 3 separate doses throughout the day.  Hormones- Hormone therapy has been associated with increased risk for certain cancers and heart disease.  Talk to your healthcare provider about if you need relief from menopausal symptoms.  Aspirin- Ask your healthcare provider about taking Aspirin to prevent Heart Disease and Stroke.  Get these Immuniztions  Flu shot- Every fall  Pneumonia shot- Once after the age of 65; if you are younger ask your healthcare provider if you need a pneumonia shot.  Tetanus- Every ten years.  Zostavax- Once after the age of 60 to prevent shingles.  Take these steps  Don't smoke- Your healthcare provider can help you quit. For tips on how to quit, ask your healthcare provider or go to www.smokefree.gov or call 1-800 QUIT-NOW.  Be physically active- Exercise 5 days a week for a minimum of 30 minutes.  If you are not already physically active, start slow and gradually work up to 30 minutes of moderate physical activity.  Try walking, dancing, bike riding, swimming, etc.  Eat a healthy diet- Eat a variety of healthy foods such as fruits, vegetables, whole grains, low fat milk, low fat cheeses, yogurt, lean meats, chicken, fish, eggs, dried beans, tofu, etc.  For more information go to www.thenutritionsource.org  Dental visit- Brush and floss teeth twice daily; visit your dentist twice a year.    Eye exam- Visit your Optometrist or Ophthalmologist yearly.  Drink alcohol in moderation- Limit alcohol intake to one drink or less a day.  Never drink and drive.  Depression- Your emotional health is as important as your physical health.  If you're feeling down or losing interest in things you normally enjoy, please talk to your healthcare provider.  Seat Belts- can save your life; always wear  one  Smoke/Carbon Monoxide detectors- These detectors need to be installed on the appropriate level of your home.  Replace batteries at least once a year.  Violence- If anyone is threatening or hurting you, please tell your healthcare provider.  Living Will/ Health care power of attorney- Discuss with your healthcare provider and family.     Things that often make reflux symptoms worse: Caffeine Carbonation (soda) Spicy foods Acidic foods (like tomato sauce, orange juice, lemonade) Fatty foods (including whole milk and ice cream) Stress (feeling sad, worried, nervous) Nicotine Alcohol NSAIDS (non-steroidal anti-inflammatories, like ibuprofen (Advil, Motrin) or naproxen (Aleve)).

## 2015-05-24 LAB — PAP IG AND HPV HIGH-RISK: HPV DNA High Risk: NOT DETECTED

## 2015-05-26 ENCOUNTER — Encounter: Payer: Self-pay | Admitting: Physician Assistant

## 2015-06-23 ENCOUNTER — Ambulatory Visit (INDEPENDENT_AMBULATORY_CARE_PROVIDER_SITE_OTHER): Payer: No Typology Code available for payment source | Admitting: Cardiovascular Disease

## 2015-06-23 ENCOUNTER — Encounter: Payer: Self-pay | Admitting: Cardiovascular Disease

## 2015-06-23 VITALS — BP 120/84 | HR 80 | Ht 64.0 in | Wt 180.0 lb

## 2015-06-23 DIAGNOSIS — R011 Cardiac murmur, unspecified: Secondary | ICD-10-CM

## 2015-06-23 NOTE — Patient Instructions (Addendum)
Medication Instructions:  Your physician does not recommend any additional medications and recommends you continue on your current medications as directed.   Labwork: None Ordered   Testing/Procedures: Your physician has requested that you have an echocardiogram. Echocardiography is a painless test that uses sound waves to create images of your heart. It provides your doctor with information about the size and shape of your heart and how well your heart's chambers and valves are working. This procedure takes approximately one hour. There are no restrictions for this procedure.    Follow-Up: Your physician recommends that you schedule a follow-up appointment in: as needed with Dr. Elease HashimotoNahser.    If you need a refill on your cardiac medications before your next appointment, please call your pharmacy.   Thank you for choosing [facility]! Eligha BridegroomMichelle Evangeline Utley, RN [phone]

## 2015-06-23 NOTE — Progress Notes (Signed)
Cardiology Office Note   Date:  06/23/2015   ID:  Victoria Morales Victoria Brott, DOB 12/15/1955, MRN [id number]  PCP:  JEFFERY,CHELLE, PA-C  Cardiologist:   Elease HashimotoNahser, Deloris PingPhilip J, MD   Chief Complaint  Patient presents with  . Heart Murmur   Problem List 1.  Heart murmur 2. Hyperlipidemia 3. Hypothyroidism    History of Present Illness: Victoria Morales Victoria Morales Morales is a 60 y.o. female who presents for heart murmur. Is basically healthy, Was told recently that she had a heart murmur Works at [employer].  Does not exercise much,  Walks quite a bit at work .  No PND or orthopnea     Past Medical History  Diagnosis Date  . Hyperlipemia   . Depression   . Hypothyroid   . Acid reflux     Past Surgical History  Procedure Laterality Date  . Inner ear surgery    . Cervical polypectomy  11/2006  . Cholecystectomy  06/2002     Current Outpatient Prescriptions  Medication Sig Dispense Refill  . aspirin EC 81 MG tablet Take 81 mg by mouth daily.    . Calcium Carbonate-Vitamin D 600-200 MG-UNIT CAPS Take 1 tablet by mouth daily.     . clomiPRAMINE (ANAFRANIL) 50 MG capsule Take 50 mg by mouth at bedtime.    . Fish Oil OIL Take 1,000 mg by mouth daily.     Marland Kitchen. glucosamine-chondroitin 500-400 MG tablet Take 1 tablet by mouth 3 (three) times daily.    Marland Kitchen. levothyroxine (SYNTHROID, LEVOTHROID) 150 MCG tablet Take 1 tablet (150 mcg total) by mouth daily before breakfast. 90 tablet 3  . Multiple Vitamin (MULTIVITAMIN) tablet Take 1 tablet by mouth daily.    Marland Kitchen. omeprazole (PRILOSEC) 40 MG capsule Take 1 capsule (40 mg total) by mouth daily. 90 capsule 3  . PARoxetine (PAXIL) 40 MG tablet Take 40 mg by mouth every morning.    Marland Kitchen. QUEtiapine (SEROQUEL) 300 MG tablet Take 300 mg by mouth at bedtime.    . simvastatin (ZOCOR) 40 MG tablet Take 1 tablet (40 mg total) by mouth daily. 90 tablet 3  . vitamin C (ASCORBIC ACID) 500 MG tablet Take 500 mg by mouth daily.     No current facility-administered medications  for this visit.    Allergies:   Penicillins    Social History:  The patient  reports that she has never smoked. She has never used smokeless tobacco. She reports that she drinks alcohol. She reports that she does not use illicit drugs.   Family History:  The patient's family history includes Breast cancer (age of onset: 9170) in her paternal aunt; Cancer (age of onset: 155) in her sister; Diabetes in her mother and sister; Heart failure in her father; Hyperlipidemia in her sister; Hypertension in her sister.    ROS:  Please see the history of present illness.    Review of Systems: Constitutional:  denies fever, chills, diaphoresis, appetite change and fatigue.  HEENT: denies photophobia, eye pain, redness, hearing loss, ear pain, congestion, sore throat, rhinorrhea, sneezing, neck pain, neck stiffness and tinnitus.  Respiratory: denies SOB, DOE, cough, chest tightness, and wheezing.  Cardiovascular: denies chest pain, palpitations and leg swelling.  Gastrointestinal: denies nausea, vomiting, abdominal pain, diarrhea, constipation, blood in stool.  Genitourinary: denies dysuria, urgency, frequency, hematuria, flank pain and difficulty urinating.  Musculoskeletal: denies  myalgias, back pain, joint swelling, arthralgias and gait problem.   Skin: denies pallor, rash and wound.  Neurological: denies dizziness, seizures, syncope,  weakness, light-headedness, numbness and headaches.   Hematological: denies adenopathy, easy bruising, personal or family bleeding history.  Psychiatric/ Behavioral: denies suicidal ideation, mood changes, confusion, nervousness, sleep disturbance and agitation.       All other systems are reviewed and negative.    PHYSICAL EXAM: VS:  BP 120/84 mmHg  Pulse 80  Ht  (1.626 m)  Wt 180 lb (81.647 kg)  BMI 30.88 kg/m2 , BMI Body mass index is 30.88 kg/(m^2). GEN: Well nourished, well developed, in no acute distress HEENT: normal Neck: no JVD, carotid bruits,  or masses Cardiac: RRR; no murmurs, rubs, or gallops,no edema  Respiratory:  clear to auscultation bilaterally, normal work of breathing GI: soft, nontender, nondistended, + BS MS: no deformity or atrophy Skin: warm and dry, no rash Neuro:  Strength and sensation are intact Psych: normal   EKG:  EKG is ordered today. The ekg ordered today demonstrates NSR at 84  Recent Labs: 05/19/2015: ALT 19; BUN 13; Creatinine 0.7; Hemoglobin 13.2; Platelets 244; Potassium 4.6; Sodium 135*; TSH 0.71    Lipid Panel    Component Value Date/Time   CHOL 149 05/19/2015   CHOL 149 05/18/2015 0830   TRIG 80 05/19/2015   HDL 61 05/19/2015   HDL 61 05/18/2015 0830   CHOLHDL 2.4 05/18/2015 0830   LDLCALC 72 05/19/2015   LDLCALC 72 05/18/2015 0830      Wt Readings from Last 3 Encounters:  06/23/15 180 lb (81.647 kg)  05/23/15 178 lb (80.74 kg)      Other studies Reviewed: Additional studies/ records that were reviewed today include: . Review of the above records demonstrates:    ASSESSMENT AND PLAN:  1.  Murmur .   By my exam, she has a very soft systolic flow murmur. I doubt that she has significant valvular disease.  I would like to get an echo for further evaluation  Will see her as needed assuming the echo is unremarkable     Current medicines are reviewed at length with the patient today.  The patient does not have concerns regarding medicines.  The following changes have been made:  no change  Labs/ tests ordered today include:   Orders Placed This Encounter  Procedures  . EKG 12-Lead     Disposition:   FU with me as needed.      Nahser, Deloris Ping, MD  06/23/2015 8:49 AM    [facility] [address] [place], [place], [place]  [address] Phone: [phone]; Fax: [phone]   [facility]  [address] [place], [place]  [address] [phone]   Fax [phone]

## 2015-07-10 ENCOUNTER — Ambulatory Visit (HOSPITAL_COMMUNITY): Payer: No Typology Code available for payment source | Attending: Cardiology

## 2015-07-10 ENCOUNTER — Other Ambulatory Visit: Payer: Self-pay

## 2015-07-10 DIAGNOSIS — E785 Hyperlipidemia, unspecified: Secondary | ICD-10-CM | POA: Insufficient documentation

## 2015-07-10 DIAGNOSIS — Z8249 Family history of ischemic heart disease and other diseases of the circulatory system: Secondary | ICD-10-CM | POA: Insufficient documentation

## 2015-07-10 DIAGNOSIS — R011 Cardiac murmur, unspecified: Secondary | ICD-10-CM | POA: Insufficient documentation

## 2015-07-10 DIAGNOSIS — I059 Rheumatic mitral valve disease, unspecified: Secondary | ICD-10-CM | POA: Insufficient documentation

## 2015-07-12 ENCOUNTER — Encounter: Payer: Self-pay | Admitting: Family Medicine

## 2015-07-13 ENCOUNTER — Other Ambulatory Visit: Payer: Self-pay

## 2015-07-13 ENCOUNTER — Telehealth: Payer: Self-pay | Admitting: Cardiovascular Disease

## 2015-07-13 ENCOUNTER — Encounter (HOSPITAL_COMMUNITY): Payer: Self-pay | Admitting: Emergency Medicine

## 2015-07-13 ENCOUNTER — Emergency Department (HOSPITAL_COMMUNITY)
Admission: EM | Admit: 2015-07-13 | Discharge: 2015-07-13 | Disposition: A | Discharge status: Home / Self Care | Payer: No Typology Code available for payment source | Attending: Emergency Medicine | Admitting: Emergency Medicine

## 2015-07-13 DIAGNOSIS — Z9049 Acquired absence of other specified parts of digestive tract: Secondary | ICD-10-CM | POA: Diagnosis not present

## 2015-07-13 DIAGNOSIS — R112 Nausea with vomiting, unspecified: Secondary | ICD-10-CM

## 2015-07-13 DIAGNOSIS — Z9889 Other specified postprocedural states: Secondary | ICD-10-CM | POA: Insufficient documentation

## 2015-07-13 DIAGNOSIS — E039 Hypothyroidism, unspecified: Secondary | ICD-10-CM | POA: Insufficient documentation

## 2015-07-13 DIAGNOSIS — Z90712 Acquired absence of cervix with remaining uterus: Secondary | ICD-10-CM | POA: Diagnosis not present

## 2015-07-13 DIAGNOSIS — E785 Hyperlipidemia, unspecified: Secondary | ICD-10-CM | POA: Insufficient documentation

## 2015-07-13 DIAGNOSIS — F329 Major depressive disorder, single episode, unspecified: Secondary | ICD-10-CM | POA: Insufficient documentation

## 2015-07-13 DIAGNOSIS — R55 Syncope and collapse: Secondary | ICD-10-CM | POA: Diagnosis not present

## 2015-07-13 DIAGNOSIS — Z7982 Long term (current) use of aspirin: Secondary | ICD-10-CM | POA: Insufficient documentation

## 2015-07-13 DIAGNOSIS — K219 Gastro-esophageal reflux disease without esophagitis: Secondary | ICD-10-CM | POA: Insufficient documentation

## 2015-07-13 DIAGNOSIS — I4581 Long QT syndrome: Secondary | ICD-10-CM | POA: Insufficient documentation

## 2015-07-13 DIAGNOSIS — R9431 Abnormal electrocardiogram [ECG] [EKG]: Secondary | ICD-10-CM

## 2015-07-13 LAB — BASIC METABOLIC PANEL
Anion gap: 9 (ref 5–15)
BUN: 16 mg/dL (ref 6–20)
CO2: 22 mmol/L (ref 22–32)
CREATININE: 0.68 mg/dL (ref 0.44–1.00)
Calcium: 8.9 mg/dL (ref 8.9–10.3)
Chloride: 107 mmol/L (ref 101–111)
GFR calc Af Amer: >60 mL/min (ref >60)
GLUCOSE: 135 mg/dL — AB (ref 65–99)
POTASSIUM: 3.8 mmol/L (ref 3.5–5.1)
SODIUM: 138 mmol/L (ref 135–145)

## 2015-07-13 LAB — CBC WITH DIFFERENTIAL/PLATELET
Basophils Absolute: 0 10*3/uL (ref 0.0–0.1)
Basophils Relative: 0 %
EOS ABS: 0.1 10*3/uL (ref 0.0–0.7)
EOS PCT: 1 %
HCT: 40.6 % (ref 36.0–46.0)
Hemoglobin: 14 g/dL (ref 12.0–15.0)
LYMPHS ABS: 0.6 10*3/uL — AB (ref 0.7–4.0)
LYMPHS PCT: 6 %
MCH: 30.2 pg (ref 26.0–34.0)
MCHC: 34.5 g/dL (ref 30.0–36.0)
MCV: 87.7 fL (ref 78.0–100.0)
MONO ABS: 0.5 10*3/uL (ref 0.1–1.0)
MONOS PCT: 5 %
Neutro Abs: 9 10*3/uL — ABNORMAL HIGH (ref 1.7–7.7)
Neutrophils Relative %: 88 %
PLATELETS: 192 10*3/uL (ref 150–400)
RBC: 4.63 MIL/uL (ref 3.87–5.11)
RDW: 12.7 % (ref 11.5–15.5)
WBC: 10.2 10*3/uL (ref 4.0–10.5)

## 2015-07-13 LAB — MAGNESIUM: MAGNESIUM: 1.9 mg/dL (ref 1.7–2.4)

## 2015-07-13 MED ORDER — ONDANSETRON HCL 4 MG/2ML IJ SOLN
4.0000 mg | Freq: Once | INTRAMUSCULAR | Status: AC
Start: 2015-07-13 — End: 2015-07-13
  Administered 2015-07-13: 4 mg via INTRAVENOUS
  Filled 2015-07-13: qty 2

## 2015-07-13 MED ORDER — SODIUM CHLORIDE 0.9 % IV BOLUS (SEPSIS)
2000.0000 mL | Freq: Once | INTRAVENOUS | Status: AC
  Administered 2015-07-13: 2000 mL via INTRAVENOUS

## 2015-07-13 MED ORDER — PROMETHAZINE HCL 25 MG PO TABS
25.0000 mg | ORAL_TABLET | Freq: Four times a day (QID) | ORAL | Status: DC | PRN
Start: 2015-07-13 — End: 2016-05-24

## 2015-07-13 NOTE — Telephone Encounter (Signed)
New message    Jan is calling to update Dr.Nasher that [facility]  She verbalized that she would like a call from him or RN to advise what she should do, so that pt do not have a heart attack   [facility]

## 2015-07-13 NOTE — Telephone Encounter (Signed)
Spoke with patient's friend, Jan, who is currently with the patient at her home.  She states they are concerned about the prolonged QT interval seen on patient's ekg this morning in the ED.  She was discharged after the ED physician consulted Dr. Katrinka BlazingSmith, cardiologist.  I advised her that it was felt that the IV Zofran given en route to the hospital caused the prolongation but I will have Dr. Elease HashimotoNahser review the chart and will call her back with his advice.  She thanked me for the call.

## 2015-07-13 NOTE — Telephone Encounter (Signed)
Spoke with patient and advised that Dr. Elease HashimotoNahser feels the prolonged QTc was due to Zofran.  She is on phenergan at home for n/v.  Dr. Elease HashimotoNahser advised that she may come in for a repeat ekg next week; he states she does not have to see him.  I offered her both options since she had already scheduled an appointment on 4/25 to see Dr. Elease HashimotoNahser.  She states she would prefer to just have the ekg if that is all that he feels is necessary.  She is scheduled for a nurse visit on 4/26 for ekg and I advised her that if any problems are seen she will be treated appropriately.  She thanked me for the call.

## 2015-07-13 NOTE — ED Notes (Signed)
MD at bedside. 

## 2015-07-13 NOTE — ED Notes (Signed)
Bed: [id number] Expected date:  Expected time:  Means of arrival:  Comments: EMS 60 yo female nausea and vomiting/syncopal episode-orthostatic VS-Zofran and NS bolus

## 2015-07-13 NOTE — ED Provider Notes (Addendum)
CSN: [id number]     Arrival date & time 07/13/15  [id number] History   First MD Initiated Contact with Patient 07/13/15 0703     Chief Complaint  Patient presents with  . Nausea  . Emesis     (Consider location/radiation/quality/duration/timing/severity/associated sxs/prior Treatment) HPI  60 year old female presents by EMS after passing out. History is taken from the significant other the bedside. Patient was having nausea and vomiting last night. No diarrhea. When she first woke up this morning she felt lightheaded and was still nauseated. She was sitting on the toilet and vomiting this morning. Around this time she ended up passing out. Significant other said that her eyes rolled to the back of her head and she leaned backwards. She did not fall. She was out for a minute or less. No seizure-like activity. Patient now feels somewhat lightheaded and still little nauseated. No hematemesis. No diarrhea. Denies headache, chest pain, shortness of breath, or abdominal pain.  Past Medical History  Diagnosis Date  . Hyperlipemia   . Depression   . Hypothyroid   . Acid reflux    Past Surgical History  Procedure Laterality Date  . Inner ear surgery    . Cervical polypectomy  11/2006  . Cholecystectomy  06/2002   Family History  Problem Relation Age of Onset  . Breast cancer Paternal Aunt 7470    2 aunts  . Diabetes Mother   . Heart failure Father     pacemaker  . Diabetes Sister   . Hyperlipidemia Sister   . Hypertension Sister   . Cancer Sister [id number]    breast cancer   Social History  Substance Use Topics  . Smoking status: Never Smoker   . Smokeless tobacco: Never Used  . Alcohol Use: 0.0 - 1.2 oz/week    0-2 Standard drinks or equivalent per week   OB History    No data available     Review of Systems  Constitutional: Negative for fever.  Respiratory: Negative for shortness of breath.   Cardiovascular: Negative for chest pain.  Gastrointestinal: Positive for nausea and vomiting.  Negative for abdominal pain and diarrhea.  Genitourinary: Negative for dysuria.  Neurological: Positive for syncope and light-headedness.  All other systems reviewed and are negative.     Allergies  Penicillins  Home Medications   Prior to Admission medications   Medication Sig Start Date End Date Taking? Authorizing Provider  aspirin EC 81 MG tablet Take 81 mg by mouth daily.    Historical Provider, MD  Calcium Carbonate-Vitamin D 600-200 MG-UNIT CAPS Take 1 tablet by mouth daily.     Historical Provider, MD  clomiPRAMINE (ANAFRANIL) 50 MG capsule Take 50 mg by mouth at bedtime.    Historical Provider, MD  Fish Oil OIL Take 1,000 mg by mouth daily.     Historical Provider, MD  glucosamine-chondroitin 500-400 MG tablet Take 1 tablet by mouth 3 (three) times daily.    Historical Provider, MD  levothyroxine (SYNTHROID, LEVOTHROID) 150 MCG tablet Take 1 tablet (150 mcg total) by mouth daily before breakfast. 05/23/15   Porfirio Oarhelle Jeffery, PA-C  Multiple Vitamin (MULTIVITAMIN) tablet Take 1 tablet by mouth daily.    Historical Provider, MD  omeprazole (PRILOSEC) 40 MG capsule Take 1 capsule (40 mg total) by mouth daily. 05/23/15   Chelle Jeffery, PA-C  PARoxetine (PAXIL) 40 MG tablet Take 40 mg by mouth every morning.    Historical Provider, MD  QUEtiapine (SEROQUEL) 300 MG tablet Take 300 mg by mouth at  bedtime.    Historical Provider, MD  simvastatin (ZOCOR) 40 MG tablet Take 1 tablet (40 mg total) by mouth daily. 05/23/15   Chelle Jeffery, PA-C  vitamin C (ASCORBIC ACID) 500 MG tablet Take 500 mg by mouth daily.    Historical Provider, MD   BP 120/78 mmHg  Pulse 77  Resp 16  SpO2 98% Physical Exam  Constitutional: She is oriented to person, place, and time. She appears well-developed and well-nourished.  HENT:  Head: Normocephalic and atraumatic.  Right Ear: External ear normal.  Left Ear: External ear normal.  Nose: Nose normal.  Eyes: EOM are normal. Pupils are equal, round, and  reactive to light. Right eye exhibits no discharge. Left eye exhibits no discharge.  Neck: Neck supple.  Cardiovascular: Normal rate, regular rhythm and normal heart sounds.   No murmur heard. Pulmonary/Chest: Effort normal and breath sounds normal.  Abdominal: Soft. She exhibits no distension. There is no tenderness.  Neurological: She is alert and oriented to person, place, and time.  CN 2-12 grossly intact. 5/5 strength in all 4 extremities. Grossly normal sensation.   Skin: Skin is warm and dry.  Nursing note and vitals reviewed.   ED Course  Procedures (including critical care time) Labs Review Labs Reviewed  CBC WITH DIFFERENTIAL/PLATELET - Abnormal; Notable for the following:    Neutro Abs 9.0 (*)    Lymphs Abs 0.6 (*)    All other components within normal limits  BASIC METABOLIC PANEL - Abnormal; Notable for the following:    Glucose, Bld 135 (*)    All other components within normal limits  MAGNESIUM    Imaging Review No results found. I have personally reviewed and evaluated these images and lab results as part of my medical decision-making.   EKG Interpretation   Date/Time:  Thursday July 13 2015 07:37:32 EDT Ventricular Rate:  100 PR Interval:  157 QRS Duration: 87 QT Interval:  393 QTC Calculation: 507 R Axis:   77 Text Interpretation:  Sinus tachycardia Low voltage, precordial leads  Borderline prolonged QT interval no significant change since 2003  Confirmed by Merced Hanners  MD, Conlan Miceli ([id number]) on 07/13/2015 8:28:26 AM       EKG Interpretation  Date/Time:  Thursday July 13 2015 08:55:11 EDT Ventricular Rate:  85 PR Interval:  167 QRS Duration: 85 QT Interval:  430 QTC Calculation: 511 R Axis:   67 Text Interpretation:  Sinus rhythm Low voltage, precordial leads Abnormal R-wave progression, early transition Prolonged QT interval no significant change since earlier in the day Confirmed by Jayquan Bradsher  MD, Levern Kalka ([id number]) on 07/13/2015 9:10:10 AM       MDM    Final diagnoses:  Syncope, unspecified syncope type  Nausea and vomiting in adult  Prolonged Q-T interval on ECG    Patient syncope was likely related to a vagal response from active vomiting. She feels much better after IV fluids and is tolerating oral fluids without difficulty. ECG shows prolonged QTC of 511. This is a little longer than a couple weeks ago when it was at 460. Could be due to being given Zofran by EMS and then here prior to ECG. I still think most likely cause was the exact vomiting episode. Discussed with Dr. Katrinka Blazing of cardiology who agrees with discharge as well as close follow-up with her cardiologist who she has seen in the past for a murmur with a benign echo. Discussed this with patient and family. Encourage increased fluids at home, will give Phenergan for nausea/vomiting.  Likely has a viral illness. Discussed strict return precautions.    Pricilla Loveless, MD 07/13/15 1610  Pricilla Loveless, MD 07/13/15 [phone]

## 2015-07-13 NOTE — ED Notes (Signed)
Per EMS pt reported to have nausea and vomiting and syncopal episode that started this morning. IV access established and 4mg  Zofran IVP given by EMS. Pt unsure of LOC.

## 2015-07-13 NOTE — Discharge Instructions (Signed)
Return to the ER immediately if you develop intractable nausea/vomiting or if you pass out again. Return to the ER if any new or worsening symptoms develop. Otherwise follow up with your primary doctor and/or Cardiologist for a repeat ECG and follow up

## 2015-07-13 NOTE — ED Notes (Signed)
Pt given pillow for comfort and sheets to prop up LT arm for IVF's to flow in better as the IV is in her LAC and becomes occluded easily.  Family is at bedside.

## 2015-07-13 NOTE — Telephone Encounter (Signed)
Her QTc was ok at her office visit with me. So I think it was due to the Zofran. Would take the zofran only as needed.

## 2015-07-13 NOTE — Telephone Encounter (Signed)
Left message for call back.

## 2015-07-14 ENCOUNTER — Ambulatory Visit (INDEPENDENT_AMBULATORY_CARE_PROVIDER_SITE_OTHER): Payer: No Typology Code available for payment source | Admitting: Physician Assistant

## 2015-07-14 VITALS — BP 122/74 | HR 115 | Temp 98.0°F | Resp 18 | Ht 64.75 in | Wt 181.0 lb

## 2015-07-14 DIAGNOSIS — R197 Diarrhea, unspecified: Secondary | ICD-10-CM | POA: Diagnosis not present

## 2015-07-14 NOTE — Patient Instructions (Addendum)
  You can use the phenergan to prevent the nausea. You need to continue drinking liquids, as you are losing more fluid in the stool than usual. You may use Immodium to slow things down if you are having very frequent stools, but we actually want this to "pass through." As you are able, advance your diet. Your stools won't be able to firm up again until you are eating more of your normal diet.   IF you received an x-ray today, you will receive an invoice from [facility]. Please contact [facility] at [phone] with questions or concerns regarding your invoice.   IF you received labwork today, you will receive an invoice from [facility]. Please contact [facility] at [phone] with questions or concerns regarding your invoice.   Our billing staff will not be able to assist you with questions regarding bills from these companies.  You will be contacted with the lab results as soon as they are available. The fastest way to get your results is to activate your My Chart account. Instructions are located on the last page of this paperwork. If you have not heard from us regarding the results in 2 weeks, please contact this office.

## 2015-07-14 NOTE — Progress Notes (Signed)
   Subjective:    Patient ID: Victoria Morales, female    DOB: 09/05/1955, 60 y.o.   MRN: [id number]  Chief Complaint  Patient presents with  . Diarrhea    vomiting, headache, per pt vomit yesterday to the point where she passed out and EMS was call  . OTHER    [facility], vomiting has subsided, diarrhea worsen since yesterday     HPI  Victoria Morales is a 60 yo female with history of hyperlipidemia, depression, hypothyroid, and GERD here today for diarrhea, vomiting and headache.  Symptom started approx 2 days ago. [facility] after having a syncopal episode while vomiting at home. Her partner states she had to call EMS because patient was unable to walk after regaining consciousness.   In the ER patient was diagnosed with a viral illness and syncope due to a vagal vasal response. Patient was noted to have a prolonged QTC during her ER visit that was thought to be due to multiple doses of Zofran prior to EKG. Patient was given IV fluids and d/c with phenergan for nausea.  After leaving the ER yesterday she continued to have mulitple (5-6) vomiting episodes through the evening last night. She has not taken the phenergan because she was vomiting so much. This morning she developed profuse watery diarrhea and has had several episode since. She has not had any more vomiting episodes since this morning and has increased her po fluid intake.   She has not tried anything for the diarrhea. She also endorse a mild headache in the posterior occipital region of her head.   Review of Systems     Objective:   Physical Exam  Constitutional: She is oriented to person, place, and time. She appears well-developed and well-nourished. No distress.  Eyes: Conjunctivae are normal. Pupils are equal, round, and reactive to light.  Neck: Normal range of motion. Neck supple.  Cardiovascular: Regular rhythm, normal heart sounds and intact distal pulses.  Tachycardia  present.   Pulmonary/Chest: Effort normal and breath sounds normal.  Abdominal: Soft. Bowel sounds are increased. There is no tenderness. There is no rebound and no guarding.  Neurological: She is alert and oriented to person, place, and time.  Skin: Skin is warm and dry. There is pallor (Mucous membranes are moist and pale).          Assessment & Plan:  1. Diarrhea, unspecified type Likely infectious GI illness, viral. Supportive care. Anticipatory guidance provided. OK to use phenergan. OK to use Immodium to slow the frequency of stools. Advance diet as tolerated.   Azucena Kubayler Leaphart PA-S 07/14/2015

## 2015-07-14 NOTE — Progress Notes (Signed)
Patient ID: Victoria Morales, female    DOB: 12/25/1955, 60 y.o.   MRN: [id number]  PCP: Olene FlossJEFFERY,Deke Tilghman, PA-C  Subjective:   Chief Complaint  Patient presents with  . Diarrhea    vomiting, headache, per pt vomit yesterday to the point where she passed out and EMS was call  . OTHER    [facility], vomiting has subsided, diarrhea worsen since yesterday     HPI Presents for evaluation of diarrhea, vomiting and headache.   Symptom started approx 2 days ago. [facility] after having a syncopal episode while vomiting at home. Her partner states she had to call EMS because patient was unable to walk after regaining consciousness.   In the ER patient was diagnosed with a viral illness and syncope due to a vasovagal response. Patient was noted to have a prolonged QTC during her ER visit that was thought to be due to multiple doses of Zofran prior to EKG. Cardiology was consulted and agrees, but patient will have outpatient cardiology follow-up none-the-less. Patient was given IV fluids and d/c with phenergan for nausea.   After leaving the ER yesterday she continued to have mulitple (5-6) vomiting episodes through the evening last night. She has not taken the phenergan because she was vomiting so much. This morning she developed profuse watery diarrhea and has had several episode since. She has not had any more vomiting episodes since this morning and has increased her po fluid intake.  She has not tried anything for the diarrhea. She also endorse a mild headache in the posterior occipital region of her head.    Review of Systems  Constitutional: Positive for fever and fatigue.  Eyes: Negative.   Respiratory: Negative.   Cardiovascular: Negative.   Gastrointestinal: Positive for nausea and diarrhea. Negative for vomiting, blood in stool and anal bleeding.  Endocrine: Negative.   Genitourinary: Negative for dysuria, urgency, frequency and  hematuria.  Neurological: Positive for syncope (only the one event yesterday), weakness and headaches. Negative for dizziness and light-headedness.       Patient Active Problem List   Diagnosis Date Noted  . Murmur 06/23/2015  . GERD (gastroesophageal reflux disease) 05/23/2015  . Hyperlipidemia 05/23/2015  . Hypothyroidism 05/23/2015  . Anxiety and depression 05/23/2015  . BMI 31.0-31.9,adult 05/23/2015     Prior to Admission medications   Medication Sig Start Date End Date Taking? Authorizing Provider  aspirin EC 81 MG tablet Take 81 mg by mouth daily.   Yes Historical Provider, MD  Calcium Carbonate-Vitamin D 600-200 MG-UNIT CAPS Take 1 tablet by mouth daily.    Yes Historical Provider, MD  clomiPRAMINE (ANAFRANIL) 50 MG capsule Take 50 mg by mouth at bedtime.   Yes Historical Provider, MD  Fish Oil OIL Take 1,000 mg by mouth daily.    Yes Historical Provider, MD  levothyroxine (SYNTHROID, LEVOTHROID) 100 MCG tablet Take 150 mcg by mouth daily before breakfast.   Yes Historical Provider, MD  levothyroxine (SYNTHROID, LEVOTHROID) 150 MCG tablet Take 1 tablet (150 mcg total) by mouth daily before breakfast. 05/23/15  Yes Lucresia Simic, PA-C  Misc Natural Products (GLUCOSAMINE CHONDROITIN VIT D3 PO) Take 2 tablets by mouth daily.   Yes Historical Provider, MD  Multiple Vitamin (MULTIVITAMIN WITH MINERALS) TABS tablet Take 1 tablet by mouth daily.   Yes Historical Provider, MD  omeprazole (PRILOSEC) 40 MG capsule Take 1 capsule (40 mg total) by mouth daily. 05/23/15  Yes Porfirio Oarhelle Danyal Whitenack,  PA-C  PARoxetine (PAXIL) 40 MG tablet Take 40 mg by mouth daily.    Yes Historical Provider, MD  promethazine (PHENERGAN) 25 MG tablet Take 1 tablet (25 mg total) by mouth every 6 (six) hours as needed for nausea or vomiting. 07/13/15  Yes Pricilla Loveless, MD  QUEtiapine (SEROQUEL) 300 MG tablet Take 300 mg by mouth at bedtime.   Yes Historical Provider, MD  simvastatin (ZOCOR) 40 MG tablet Take 1 tablet  (40 mg total) by mouth daily. 05/23/15  Yes Taylore Hinde, PA-C  vitamin C (ASCORBIC ACID) 500 MG tablet Take 500 mg by mouth daily.   Yes Historical Provider, MD     Allergies  Allergen Reactions  . Penicillins Rash    Has patient had a PCN reaction causing immediate rash, facial/tongue/throat swelling, SOB or lightheadedness with hypotension: No Has patient had a PCN reaction causing severe rash involving mucus membranes or skin necrosis: No Has patient had a PCN reaction that required hospitalization No Has patient had a PCN reaction occurring within the last 10 years: No If all of the above answers are "NO", then may proceed with Cephalosporin use.        Objective:  Physical Exam  Constitutional: She is oriented to person, place, and time. She appears well-developed and well-nourished. She is active and cooperative. No distress.  BP 122/74 mmHg  Pulse 115  Temp(Src) 98 F (36.7 C) (Oral)  Resp 18  Ht 5' 4.75" (1.645 m)  Wt 181 lb (82.101 kg)  BMI 30.34 kg/m2  SpO2 96%  HENT:  Head: Normocephalic and atraumatic.  Right Ear: Hearing normal.  Left Ear: Hearing normal.  Eyes: Conjunctivae, EOM and lids are normal. No scleral icterus.  Neck: Normal range of motion. Neck supple. No thyromegaly present.  Cardiovascular: Normal rate, regular rhythm and normal heart sounds.   Pulses:      Radial pulses are 2+ on the right side, and 2+ on the left side.  Pulmonary/Chest: Effort normal and breath sounds normal.  Abdominal: Soft. Normal appearance. She exhibits no shifting dullness, no distension, no pulsatile liver, no fluid wave, no abdominal bruit, no ascites, no pulsatile midline mass and no mass. Bowel sounds are increased. There is no hepatosplenomegaly. There is no tenderness.  Lymphadenopathy:       Head (right side): No tonsillar, no preauricular, no posterior auricular and no occipital adenopathy present.       Head (left side): No tonsillar, no preauricular, no posterior  auricular and no occipital adenopathy present.    She has no cervical adenopathy.       Right: No supraclavicular adenopathy present.       Left: No supraclavicular adenopathy present.  Neurological: She is alert and oriented to person, place, and time. No sensory deficit.  Skin: Skin is warm, dry and intact. No rash noted. She is not diaphoretic. No cyanosis or erythema. There is pallor (mucous membranes). Nails show no clubbing.  Psychiatric: She has a normal mood and affect. Her speech is normal and behavior is normal.           Assessment & Plan:   1. Diarrhea, unspecified type Likely infectious GI illness, viral. Supportive care. Anticipatory guidance provided. OK to use phenergan. OK to use Immodium to slow the frequency of stools. Advance diet as tolerated.    Fernande Bras, PA-C Physician Assistant-Certified [facility]

## 2015-07-18 ENCOUNTER — Encounter: Payer: No Typology Code available for payment source | Admitting: Cardiovascular Disease

## 2015-07-19 ENCOUNTER — Ambulatory Visit (INDEPENDENT_AMBULATORY_CARE_PROVIDER_SITE_OTHER): Payer: No Typology Code available for payment source | Admitting: *Deleted

## 2015-07-19 DIAGNOSIS — I498 Other specified cardiac arrhythmias: Secondary | ICD-10-CM | POA: Diagnosis not present

## 2015-07-19 DIAGNOSIS — R011 Cardiac murmur, unspecified: Secondary | ICD-10-CM

## 2015-07-19 NOTE — Patient Instructions (Signed)
EKG reviewed by Dr. Elease HashimotoNahser Sinus rhythm rate 78 beats/minute. Take Zofran as needed not on regular bases.

## 2015-10-31 ENCOUNTER — Telehealth: Payer: Self-pay

## 2015-10-31 DIAGNOSIS — L989 Disorder of the skin and subcutaneous tissue, unspecified: Secondary | ICD-10-CM

## 2015-10-31 NOTE — Telephone Encounter (Signed)
Referral to Dr. Ernestene MentionHaywood M Ingram @ [facility] Removal of thing on the back of her neck - it moves around.   Zella BallRobin [phone]   Chelle

## 2015-11-02 NOTE — Telephone Encounter (Signed)
Left message for pt to call back  °

## 2015-11-06 NOTE — Telephone Encounter (Signed)
Patient returned call but was unable to reach Delaney Meigsamara to speak with her. Please return patient's call.  CB#: [phone]

## 2015-11-09 ENCOUNTER — Telehealth: Payer: Self-pay | Admitting: Physician Assistant

## 2015-11-09 ENCOUNTER — Encounter: Payer: Self-pay | Admitting: Physician Assistant

## 2015-11-09 DIAGNOSIS — R221 Localized swelling, mass and lump, neck: Secondary | ICD-10-CM

## 2015-11-09 NOTE — Telephone Encounter (Signed)
Patient requesting referral to  [facility] [phone] Dr Claud KelpHaywood Ingram for removal of lump posterior neck present x 1 year and getting larging nontender nontrauma.  Patient reported [facility] has [facility] and no return phone call.  Requesting help from NP at occupational medicine clinic at [employer]  Patient notified referral entered in Epic and to follow up with PCM/insurance and Dr Neita GarnetHaywood's office to ensure if insurance requires any further preauthorization/etc. Patient verbalized understanding of information/instructions, agreed with plan of care and had no further questions at this time

## 2015-11-10 NOTE — Telephone Encounter (Signed)
Spoke with pt, she states Chelle looked at this when she was here for her physical. She states she just wants it to be removed if possible but Chelle was uncomfortable removing it in the office. Can we refer Chelle?

## 2015-11-11 NOTE — Telephone Encounter (Signed)
Yes, referral placed

## 2015-11-11 NOTE — Addendum Note (Signed)
Addended by: Fernande BrasJEFFERY, Lesleyanne Politte S on: 11/11/2015 08:20 AM   Modules accepted: Orders

## 2015-12-09 ENCOUNTER — Encounter: Payer: Self-pay | Admitting: Physician Assistant

## 2015-12-09 DIAGNOSIS — L720 Epidermal cyst: Secondary | ICD-10-CM

## 2015-12-09 HISTORY — DX: Epidermal cyst: L72.0

## 2015-12-26 ENCOUNTER — Other Ambulatory Visit: Payer: Self-pay | Admitting: Family Medicine

## 2015-12-27 LAB — CMP12+LP+TP+TSH+6AC+CBC/D/PLT
ALT: 19 IU/L (ref 0–32)
AST: 21 IU/L (ref 0–40)
Albumin/Globulin Ratio: 1.6 (ref 1.2–2.2)
Albumin: 4.2 g/dL (ref 3.5–5.5)
Alkaline Phosphatase: 74 IU/L (ref 39–117)
BASOS ABS: 0.1 10*3/uL (ref 0.0–0.2)
BUN/Creatinine Ratio: 13 (ref 9–23)
BUN: 10 mg/dL (ref 6–24)
Basos: 1 %
Bilirubin Total: 0.4 mg/dL (ref 0.0–1.2)
CALCIUM: 9.1 mg/dL (ref 8.7–10.2)
CHLORIDE: 97 mmol/L (ref 96–106)
CHOLESTEROL TOTAL: 152 mg/dL (ref 100–199)
CREATININE: 0.75 mg/dL (ref 0.57–1.00)
Chol/HDL Ratio: 2.8 ratio units (ref 0.0–4.4)
EOS (ABSOLUTE): 0.3 10*3/uL (ref 0.0–0.4)
Eos: 4 %
Estimated CHD Risk: <0.5 times avg. (ref 0.0–1.0)
FREE THYROXINE INDEX: 1.9 (ref 1.2–4.9)
GFR, EST AFRICAN AMERICAN: 101 mL/min/{1.73_m2} (ref >59)
GFR, EST NON AFRICAN AMERICAN: 88 mL/min/{1.73_m2} (ref >59)
GGT: 18 IU/L (ref 0–60)
GLUCOSE: 86 mg/dL (ref 65–99)
Globulin, Total: 2.7 g/dL (ref 1.5–4.5)
HDL: 54 mg/dL (ref >39)
Hematocrit: 39.3 % (ref 34.0–46.6)
Hemoglobin: 13.3 g/dL (ref 11.1–15.9)
IMMATURE GRANULOCYTES: 0 %
IRON: 105 ug/dL (ref 27–159)
Immature Grans (Abs): 0 10*3/uL (ref 0.0–0.1)
LDH: 204 IU/L (ref 119–226)
LDL Calculated: 77 mg/dL (ref 0–99)
Lymphocytes Absolute: 2 10*3/uL (ref 0.7–3.1)
Lymphs: 32 %
MCH: 30.5 pg (ref 26.6–33.0)
MCHC: 33.8 g/dL (ref 31.5–35.7)
MCV: 90 fL (ref 79–97)
MONOCYTES: 8 %
Monocytes Absolute: 0.5 10*3/uL (ref 0.1–0.9)
NEUTROS PCT: 55 %
Neutrophils Absolute: 3.4 10*3/uL (ref 1.4–7.0)
PLATELETS: 248 10*3/uL (ref 150–379)
Phosphorus: 3.9 mg/dL (ref 2.5–4.5)
Potassium: 4.4 mmol/L (ref 3.5–5.2)
RBC: 4.36 x10E6/uL (ref 3.77–5.28)
RDW: 13.5 % (ref 12.3–15.4)
Sodium: 136 mmol/L (ref 134–144)
T3 UPTAKE RATIO: 25 % (ref 24–39)
T4 TOTAL: 7.7 ug/dL (ref 4.5–12.0)
TRIGLYCERIDES: 107 mg/dL (ref 0–149)
TSH: 0.307 u[IU]/mL — ABNORMAL LOW (ref 0.450–4.500)
Total Protein: 6.9 g/dL (ref 6.0–8.5)
Uric Acid: 3.9 mg/dL (ref 2.5–7.1)
VLDL CHOLESTEROL CAL: 21 mg/dL (ref 5–40)
WBC: 6.1 10*3/uL (ref 3.4–10.8)

## 2015-12-27 LAB — HGB A1C W/O EAG: HEMOGLOBIN A1C: 5.7 % — AB (ref 4.8–5.6)

## 2015-12-29 ENCOUNTER — Other Ambulatory Visit: Payer: Self-pay | Admitting: Physician Assistant

## 2015-12-29 DIAGNOSIS — Z1231 Encounter for screening mammogram for malignant neoplasm of breast: Secondary | ICD-10-CM

## 2016-01-05 ENCOUNTER — Other Ambulatory Visit: Payer: Self-pay | Admitting: General Surgery

## 2016-01-05 HISTORY — PX: CYST EXCISION: SHX5701

## 2016-01-09 NOTE — Progress Notes (Signed)
Inform patient of Pathology report,.benign cyst of neck... As expected  hmi

## 2016-01-15 ENCOUNTER — Ambulatory Visit
Admission: RE | Admit: 2016-01-15 | Discharge: 2016-01-15 | Disposition: A | Discharge status: Home / Self Care | Payer: No Typology Code available for payment source | Source: Ambulatory Visit | Attending: Physician Assistant | Admitting: Physician Assistant

## 2016-01-15 DIAGNOSIS — Z1231 Encounter for screening mammogram for malignant neoplasm of breast: Secondary | ICD-10-CM | POA: Diagnosis present

## 2016-03-13 ENCOUNTER — Ambulatory Visit (INDEPENDENT_AMBULATORY_CARE_PROVIDER_SITE_OTHER): Payer: No Typology Code available for payment source | Admitting: Physician Assistant

## 2016-03-13 ENCOUNTER — Ambulatory Visit (INDEPENDENT_AMBULATORY_CARE_PROVIDER_SITE_OTHER): Payer: No Typology Code available for payment source

## 2016-03-13 VITALS — BP 118/64 | HR 96 | Temp 98.9°F | Resp 16 | Ht 64.0 in | Wt 177.8 lb

## 2016-03-13 DIAGNOSIS — J988 Other specified respiratory disorders: Secondary | ICD-10-CM | POA: Diagnosis not present

## 2016-03-13 DIAGNOSIS — R05 Cough: Secondary | ICD-10-CM | POA: Diagnosis not present

## 2016-03-13 DIAGNOSIS — R058 Other specified cough: Secondary | ICD-10-CM

## 2016-03-13 LAB — POCT CBC
GRANULOCYTE PERCENT: 78.1 % (ref 37–80)
HEMATOCRIT: 34.1 % — AB (ref 37.7–47.9)
Hemoglobin: 12.1 g/dL — AB (ref 12.2–16.2)
Lymph, poc: 1.3 (ref 0.6–3.4)
MCH: 31 pg (ref 27–31.2)
MCHC: 35.4 g/dL (ref 31.8–35.4)
MCV: 87.5 fL (ref 80–97)
MID (CBC): 0.7 (ref 0–0.9)
MPV: 5.4 fL (ref 0–99.8)
PLATELET COUNT, POC: 275 10*3/uL (ref 142–424)
POC GRANULOCYTE: 7.1 — AB (ref 2–6.9)
POC LYMPH PERCENT: 14.2 %L (ref 10–50)
POC MID %: 7.7 %M (ref 0–12)
RBC: 3.89 M/uL — AB (ref 4.04–5.48)
RDW, POC: 12.5 %
WBC: 9.1 10*3/uL (ref 4.6–10.2)

## 2016-03-13 MED ORDER — DOXYCYCLINE HYCLATE 100 MG PO CAPS: 100.0000 mg | ORAL_CAPSULE | Freq: Two times a day (BID) | ORAL | 0 refills | Status: AC

## 2016-03-13 MED ORDER — ERYTHROMYCIN 5 MG/GM OP OINT: 1.0000 "application " | TOPICAL_OINTMENT | Freq: Four times a day (QID) | OPHTHALMIC | 0 refills | Status: DC

## 2016-03-13 MED ORDER — HYDROCOD POLST-CPM POLST ER 10-8 MG/5ML PO SUER: 5.0000 mL | Freq: Every evening | ORAL | 0 refills | Status: DC | PRN

## 2016-03-13 MED ORDER — GUAIFENESIN ER 1200 MG PO TB12: 1.0000 | ORAL_TABLET | Freq: Two times a day (BID) | ORAL | 1 refills | Status: DC | PRN

## 2016-03-13 NOTE — Progress Notes (Signed)
[facility] [address], [place] [id number] [phone]  Date:  03/13/2016   Name:  Victoria Morales   DOB:  11/17/1955   MRN:  [id number]  PCP:  Porfirio Oarhelle Jeffery, PA-C   Chief Complaint  Patient presents with  . Sinusitis    x 1wk/ pt eyes are red a puffy  . Cough    x 2 days    History of Present Illness:  Victoria Morales is a 60 y.o. female patient who [facility] cc of eye redness and cough.   Sinus pressure for 1 week.  4 days after the start of the symptoms, she was placed on an antibiotic for her sinus pressure and cough, by her employer at [employer]  She then developed increased coughing and sinus pain, and was placed on cephalexin.  She started to develop eye redness.  there is mucus drainage.  No heavy fatigue or fever.  She started using flonase, anti-histamine eye drops, taking medcations last Tuesday.  She is also taking seroquel for depression/insomnia, and thyroid medication.  seroquel is a low dose.  No hx of bipolar.         Patient Active Problem List   Diagnosis Date Noted  . Lipoma of neck 12/09/2015  . Murmur 06/23/2015  . GERD (gastroesophageal reflux disease) 05/23/2015  . Hyperlipidemia 05/23/2015  . Hypothyroidism 05/23/2015  . Anxiety and depression 05/23/2015  . BMI 31.0-31.9,adult 05/23/2015    Past Medical History:  Diagnosis Date  . Acid reflux   . Depression   . Hyperlipemia   . Hypothyroid     Past Surgical History:  Procedure Laterality Date  . CERVICAL POLYPECTOMY  11/2006  . CHOLECYSTECTOMY  06/2002  . INNER EAR SURGERY      Social History  Substance Use Topics  . Smoking status: Never Smoker  . Smokeless tobacco: Never Used  . Alcohol use 0.0 - 1.2 oz/week    Family History  Problem Relation Age of Onset  . Diabetes Mother   . Heart failure Father     pacemaker  . Diabetes Sister   . Hyperlipidemia Sister   . Hypertension Sister   . Cancer Sister [id number]    breast cancer  . Breast  cancer Sister   . Breast cancer Paternal Aunt 1970  . Breast cancer Paternal Aunt [id number]    Allergies  Allergen Reactions  . Penicillins Rash    Has patient had a PCN reaction causing immediate rash, facial/tongue/throat swelling, SOB or lightheadedness with hypotension: No Has patient had a PCN reaction causing severe rash involving mucus membranes or skin necrosis: No Has patient had a PCN reaction that required hospitalization No Has patient had a PCN reaction occurring within the last 10 years: No If all of the above answers are "NO", then may proceed with Cephalosporin use.     Medication list has been reviewed and updated.  Current Outpatient Prescriptions on File Prior to Visit  Medication Sig Dispense Refill  . aspirin EC 81 MG tablet Take 81 mg by mouth daily.    . Calcium Carbonate-Vitamin D 600-200 MG-UNIT CAPS Take 1 tablet by mouth daily.     . clomiPRAMINE (ANAFRANIL) 50 MG capsule Take 50 mg by mouth at bedtime.    . Fish Oil OIL Take 1,000 mg by mouth daily.     Marland Kitchen. levothyroxine (SYNTHROID, LEVOTHROID) 100 MCG tablet Take 150 mcg by mouth daily before breakfast.    . levothyroxine (  SYNTHROID, LEVOTHROID) 150 MCG tablet Take 1 tablet (150 mcg total) by mouth daily before breakfast. 90 tablet 3  . Misc Natural Products (GLUCOSAMINE CHONDROITIN VIT D3 PO) Take 2 tablets by mouth daily.    . Multiple Vitamin (MULTIVITAMIN WITH MINERALS) TABS tablet Take 1 tablet by mouth daily.    Marland Kitchen. omeprazole (PRILOSEC) 40 MG capsule Take 1 capsule (40 mg total) by mouth daily. 90 capsule 3  . PARoxetine (PAXIL) 40 MG tablet Take 40 mg by mouth daily.     . QUEtiapine (SEROQUEL) 300 MG tablet Take 300 mg by mouth at bedtime.    . simvastatin (ZOCOR) 40 MG tablet Take 1 tablet (40 mg total) by mouth daily. 90 tablet 3  . vitamin C (ASCORBIC ACID) 500 MG tablet Take 500 mg by mouth daily.    . promethazine (PHENERGAN) 25 MG tablet Take 1 tablet (25 mg total) by mouth every 6 (six) hours as needed  for nausea or vomiting. (Patient not taking: Reported on 03/13/2016) 10 tablet 0   No current facility-administered medications on file prior to visit.     ROS ROS otherwise unremarkable unless listed above.  Physical Examination: BP 118/64   Pulse 96   Temp 98.9 F (37.2 C) (Oral)   Resp 16   Ht 5\' 4"  (1.626 m)   Wt 177 lb 12.8 oz (80.6 kg)   SpO2 96%   BMI 30.52 kg/m  Ideal Body Weight: Weight in (lb) to have BMI = 25: 145.3  Physical Exam  Constitutional: She is oriented to person, place, and time. She appears well-developed and well-nourished. No distress.  HENT:  Head: Normocephalic and atraumatic.  Right Ear: Tympanic membrane, external ear and ear canal normal.  Left Ear: Tympanic membrane, external ear and ear canal normal.  Nose: Mucosal edema and rhinorrhea present. Right sinus exhibits no maxillary sinus tenderness and no frontal sinus tenderness. Left sinus exhibits no maxillary sinus tenderness and no frontal sinus tenderness.  Mouth/Throat: No uvula swelling. No oropharyngeal exudate, posterior oropharyngeal edema or posterior oropharyngeal erythema.  Eyes: EOM are normal. Pupils are equal, round, and reactive to light. Right conjunctiva has a hemorrhage. Left conjunctiva has a hemorrhage.  Cardiovascular: Normal rate and regular rhythm.  Exam reveals no gallop, no distant heart sounds and no friction rub.   No murmur heard. Pulmonary/Chest: Effort normal. No respiratory distress. She has no decreased breath sounds. She has no wheezes. She has no rhonchi.  Lymphadenopathy:       Head (right side): No submandibular, no tonsillar, no preauricular and no posterior auricular adenopathy present.       Head (left side): No submandibular, no tonsillar, no preauricular and no posterior auricular adenopathy present.  Neurological: She is alert and oriented to person, place, and time.  Skin: She is not diaphoretic.  Psychiatric: She has a normal mood and affect. Her behavior  is normal.     Assessment and Plan: Victoria Morales is a 60 y.o. female who is here today for cc of eye redness and congestion. Start doxycycline.  Attempted to contact [employer] however can not reach. Advised to stop the keflex.  She will start erthromycin ointment.  Advised to return as needed.  Productive cough - Plan: POCT CBC, DG Chest 2 View, doxycycline (VIBRAMYCIN) 100 MG capsule, chlorpheniramine-HYDROcodone (TUSSIONEX PENNKINETIC ER) 10-8 MG/5ML SUER, Guaifenesin (MUCINEX MAXIMUM STRENGTH) 1200 MG TB12  Trena PlattStephanie Vona Whiters, PA-C [facility] 12/31/20178:16 AM   There are no  diagnoses linked to this encounter.  Trena Platt, PA-C [facility] 03/13/2016 9:26 AM

## 2016-03-13 NOTE — Patient Instructions (Addendum)
  Please hydrate well with water 64oz. I would like you to do the mucinex as prescribed.   Take antibiotic as prescribed.  (one for the eye, one oral). Follow up in 2 days.    IF you received an x-ray today, you will receive an invoice from [facility]. Please contact [facility] at [phone] with questions or concerns regarding your invoice.   IF you received labwork today, you will receive an invoice from [facility]. Please contact [facility] at [phone] with questions or concerns regarding your invoice.   Our billing staff will not be able to assist you with questions regarding bills from these companies.  You will be contacted with the lab results as soon as they are available. The fastest way to get your results is to activate your My Chart account. Instructions are located on the last page of this paperwork. If you have not heard from us regarding the results in 2 weeks, please contact this office.

## 2016-03-15 ENCOUNTER — Encounter: Payer: Self-pay | Admitting: Physician Assistant

## 2016-03-15 ENCOUNTER — Ambulatory Visit (INDEPENDENT_AMBULATORY_CARE_PROVIDER_SITE_OTHER): Payer: No Typology Code available for payment source

## 2016-03-15 ENCOUNTER — Ambulatory Visit (INDEPENDENT_AMBULATORY_CARE_PROVIDER_SITE_OTHER): Payer: No Typology Code available for payment source | Admitting: Physician Assistant

## 2016-03-15 VITALS — BP 110/68 | HR 92 | Temp 98.3°F | Resp 18 | Ht 64.0 in | Wt 176.0 lb

## 2016-03-15 DIAGNOSIS — R05 Cough: Secondary | ICD-10-CM | POA: Diagnosis not present

## 2016-03-15 DIAGNOSIS — J3489 Other specified disorders of nose and nasal sinuses: Secondary | ICD-10-CM

## 2016-03-15 DIAGNOSIS — R059 Cough, unspecified: Secondary | ICD-10-CM

## 2016-03-15 MED ORDER — ARTIFICIAL TEARS OP OINT: TOPICAL_OINTMENT | OPHTHALMIC | 1 refills | Status: DC | PRN

## 2016-03-15 MED ORDER — CETIRIZINE HCL 10 MG PO TABS: 10.0000 mg | ORAL_TABLET | Freq: Every day | ORAL | 11 refills | Status: DC

## 2016-03-15 MED ORDER — IPRATROPIUM BROMIDE 0.02 % IN SOLN
0.5000 mg | Freq: Once | RESPIRATORY_TRACT | Status: AC
  Administered 2016-03-15: 0.5 mg via RESPIRATORY_TRACT

## 2016-03-15 MED ORDER — ALBUTEROL SULFATE (2.5 MG/3ML) 0.083% IN NEBU
2.5000 mg | INHALATION_SOLUTION | Freq: Once | RESPIRATORY_TRACT | Status: AC
  Administered 2016-03-15: 2.5 mg via RESPIRATORY_TRACT

## 2016-03-15 MED ORDER — PREDNISONE 20 MG PO TABS: ORAL_TABLET | ORAL | 0 refills | Status: DC

## 2016-03-15 NOTE — Progress Notes (Signed)
[facility] [address], [place] [id number] [phone]  Date:  03/15/2016   Name:  Victoria MerleRobin Sue Morales   DOB:  04/02/1955   MRN:  [id number]  PCP:  Porfirio Oarhelle Jeffery, PA-C    History of Present Illness:  Victoria Morales is a 60 y.o. female patient who [facility] cc of cough and eye redness follow up. Patient was seen here 2 days ago for respiratory infection and given doxycycline and erythromycin for possible conjunctivitis.  She reports that she went home and found that she was given doxycycline by her employer health center and was changed to the cephalexin.  She reports that she has the cough, which the cough suppressant helps but only temporarily.  She denies any difficulty with her breathing, but does feel fatigued.  No fever.  Her eye swelling has improved, but eye redness has worsened.    She states that the erythromycin did not help, but seemed to make it itch more.       Patient Active Problem List   Diagnosis Date Noted  . Lipoma of neck 12/09/2015  . Murmur 06/23/2015  . GERD (gastroesophageal reflux disease) 05/23/2015  . Hyperlipidemia 05/23/2015  . Hypothyroidism 05/23/2015  . Anxiety and depression 05/23/2015  . BMI 31.0-31.9,adult 05/23/2015    Past Medical History:  Diagnosis Date  . Acid reflux   . Depression   . Hyperlipemia   . Hypothyroid     Past Surgical History:  Procedure Laterality Date  . CERVICAL POLYPECTOMY  11/2006  . CHOLECYSTECTOMY  06/2002  . INNER EAR SURGERY      Social History  Substance Use Topics  . Smoking status: Never Smoker  . Smokeless tobacco: Never Used  . Alcohol use 0.0 - 1.2 oz/week    Family History  Problem Relation Age of Onset  . Diabetes Mother   . Heart failure Father     pacemaker  . Diabetes Sister   . Hyperlipidemia Sister   . Hypertension Sister   . Cancer Sister [id number]    breast cancer  . Breast cancer Sister   . Breast cancer Paternal Aunt [phone]  . Breast cancer Paternal Aunt  [id number]    Allergies  Allergen Reactions  . Penicillins Rash    Has patient had a PCN reaction causing immediate rash, facial/tongue/throat swelling, SOB or lightheadedness with hypotension: No Has patient had a PCN reaction causing severe rash involving mucus membranes or skin necrosis: No Has patient had a PCN reaction that required hospitalization No Has patient had a PCN reaction occurring within the last 10 years: No If all of the above answers are "NO", then may proceed with Cephalosporin use.     Medication list has been reviewed and updated.  Current Outpatient Prescriptions on File Prior to Visit  Medication Sig Dispense Refill  . aspirin EC 81 MG tablet Take 81 mg by mouth daily.    . benzonatate (TESSALON) 200 MG capsule Take 200 mg by mouth 3 (three) times daily as needed for cough.    . Calcium Carbonate-Vitamin D 600-200 MG-UNIT CAPS Take 1 tablet by mouth daily.     . cephALEXin (KEFLEX) 500 MG capsule Take 500 mg by mouth 4 (four) times daily.    . chlorpheniramine-HYDROcodone (TUSSIONEX PENNKINETIC ER) 10-8 MG/5ML SUER Take 5 mLs by mouth at bedtime as needed. 60 mL 0  . clomiPRAMINE (ANAFRANIL) 50 MG capsule Take 50 mg by mouth at bedtime.    .Marland Kitchen  doxycycline (VIBRAMYCIN) 100 MG capsule Take 1 capsule (100 mg total) by mouth 2 (two) times daily. 20 capsule 0  . erythromycin ophthalmic ointment Place 1 application into both eyes 4 (four) times daily. 3.5 g 0  . Fexofenadine-Pseudoephedrine (ALLEGRA-D PO) Take by mouth.    . Fish Oil OIL Take 1,000 mg by mouth daily.     . Fluticasone Furoate 100 MCG/ACT AEPB Inhale into the lungs.    . Guaifenesin (MUCINEX MAXIMUM STRENGTH) 1200 MG TB12 Take 1 tablet (1,200 mg total) by mouth every 12 (twelve) hours as needed. 14 tablet 1  . levothyroxine (SYNTHROID, LEVOTHROID) 100 MCG tablet Take 150 mcg by mouth daily before breakfast.    . levothyroxine (SYNTHROID, LEVOTHROID) 150 MCG tablet Take 1 tablet (150 mcg total) by mouth daily  before breakfast. 90 tablet 3  . Misc Natural Products (GLUCOSAMINE CHONDROITIN VIT D3 PO) Take 2 tablets by mouth daily.    . montelukast (SINGULAIR) 10 MG tablet Take 10 mg by mouth at bedtime.    . Multiple Vitamin (MULTIVITAMIN WITH MINERALS) TABS tablet Take 1 tablet by mouth daily.    Marland Kitchen. omeprazole (PRILOSEC) 40 MG capsule Take 1 capsule (40 mg total) by mouth daily. 90 capsule 3  . PARoxetine (PAXIL) 40 MG tablet Take 40 mg by mouth daily.     . promethazine (PHENERGAN) 25 MG tablet Take 1 tablet (25 mg total) by mouth every 6 (six) hours as needed for nausea or vomiting. 10 tablet 0  . QUEtiapine (SEROQUEL) 300 MG tablet Take 300 mg by mouth at bedtime.    . simvastatin (ZOCOR) 40 MG tablet Take 1 tablet (40 mg total) by mouth daily. 90 tablet 3  . vitamin C (ASCORBIC ACID) 500 MG tablet Take 500 mg by mouth daily.     No current facility-administered medications on file prior to visit.     ROS ROS otherwise unremarkable unless listed above.  Physical Examination: BP 110/68 (BP Location: Right Arm, Patient Position: Sitting, Cuff Size: Small)   Pulse 92   Temp 98.3 F (36.8 C) (Oral)   Resp 18   Ht 5\' 4"  (1.626 m)   Wt 176 lb (79.8 kg)   SpO2 98%   BMI 30.21 kg/m  Ideal Body Weight: Weight in (lb) to have BMI = 25: 145.3  Physical Exam  Constitutional: She is oriented to person, place, and time. She appears well-developed and well-nourished. No distress.  HENT:  Head: Normocephalic and atraumatic.  Right Ear: Tympanic membrane, external ear and ear canal normal.  Left Ear: Tympanic membrane, external ear and ear canal normal.  Nose: Mucosal edema and rhinorrhea present. Right sinus exhibits no maxillary sinus tenderness and no frontal sinus tenderness. Left sinus exhibits no maxillary sinus tenderness and no frontal sinus tenderness.  Mouth/Throat: No uvula swelling. No oropharyngeal exudate, posterior oropharyngeal edema or posterior oropharyngeal erythema.  Yellowing  consistent with fading ecchymosis at the right eye periorbitally.  No tenderness.   Eyes: EOM are normal. Pupils are equal, round, and reactive to light. Right eye exhibits no discharge. Left eye exhibits no discharge. Right conjunctiva has a hemorrhage. Left conjunctiva has a hemorrhage.  Cardiovascular: Normal rate and regular rhythm.  Exam reveals no gallop, no distant heart sounds and no friction rub.   No murmur heard. Pulmonary/Chest: Effort normal. No respiratory distress. She has no decreased breath sounds. She has no wheezes. She has no rhonchi.  Lymphadenopathy:       Head (right side): No submandibular, no  tonsillar, no preauricular and no posterior auricular adenopathy present.       Head (left side): No submandibular, no tonsillar, no preauricular and no posterior auricular adenopathy present.  Neurological: She is alert and oriented to person, place, and time.  Skin: She is not diaphoretic.  Psychiatric: She has a normal mood and affect. Her behavior is normal.     Assessment and Plan: Tsering Leaman is a 60 y.o. female who is here today for cc of eye redness and cough. Improvement with nebulizing treatment.  Will start a prednisone taper, and zyrtec.  Advised to return as needed.  She can continue the doxycycline.  But this is likely a viral infection.   Sinus pressure - Plan: DG Sinuses Complete, predniSONE (DELTASONE) 20 MG tablet, cetirizine (ZYRTEC) 10 MG tablet  Coughing - Plan: albuterol (PROVENTIL) (2.5 MG/3ML) 0.083% nebulizer solution 2.5 mg, ipratropium (ATROVENT) nebulizer solution 0.5 mg, predniSONE (DELTASONE) 20 MG tablet, cetirizine (ZYRTEC) 10 MG tablet  Victoria Platt, PA-C [facility] 1/9/20188:54 AM  Victoria Platt, PA-C [facility] 03/15/2016 8:13 AM

## 2016-03-15 NOTE — Patient Instructions (Addendum)
  Please take the flonase and the zyrtec.  The zyrtec was ordered today.  You have the flonase.  Take as prescribed.  Please continue the antibiotic.  Return if your symptoms do not improve.  If you feel any difficulty getting to sleep, change in your mood, manic behaviors--stop the medicine and return.      IF you received an x-ray today, you will receive an invoice from [facility]. Please contact [facility] at [phone] with questions or concerns regarding your invoice.   IF you received labwork today, you will receive an invoice from [facility]. Please contact [facility] at [phone] with questions or concerns regarding your invoice.   Our billing staff will not be able to assist you with questions regarding bills from these companies.  You will be contacted with the lab results as soon as they are available. The fastest way to get your results is to activate your My Chart account. Instructions are located on the last page of this paperwork. If you have not heard from us regarding the results in 2 weeks, please contact this office.

## 2016-04-02 ENCOUNTER — Encounter: Payer: Self-pay | Admitting: Physician Assistant

## 2016-05-21 ENCOUNTER — Telehealth: Payer: Self-pay | Admitting: Family Medicine

## 2016-05-21 NOTE — Telephone Encounter (Signed)
Ha[facility] would like for us to do patients CPE but they will draw patients labs and she would like orders for them to draw pt blood pt will call to set appointment up with Chelle next month Haileys phone number [phone]  Fax number [phone] please call with lab orders

## 2016-05-22 NOTE — Telephone Encounter (Signed)
I feel this is inappropriate however am routing to you for a decision. Deliah BostonMichael Cristi Gwynn, MS, PA-C 9:51 AM, 05/22/2016

## 2016-05-22 NOTE — Telephone Encounter (Signed)
I am fine with patients having labs drawn at another facility if it reduces their cost, is more convenient, AND I can receive the results.  The patient had labs drawn at work last year and brought them to her wellness visit with me.  Based on chart review, I would draw:  CMET Lipid profile Thyroid panel (including TSH and free T4) CBC with differential A1C  **I left a message for Hailey with the message above. If she needs WRITTEN orders, advised her to call back with additional instructions.

## 2016-05-23 ENCOUNTER — Other Ambulatory Visit: Payer: Self-pay | Admitting: *Deleted

## 2016-05-24 ENCOUNTER — Other Ambulatory Visit: Payer: Self-pay | Admitting: *Deleted

## 2016-05-24 DIAGNOSIS — Z0000 Encounter for general adult medical examination without abnormal findings: Secondary | ICD-10-CM

## 2016-05-24 NOTE — Progress Notes (Signed)
Labs drawn for upcoming PCP appt.

## 2016-05-25 LAB — CMP12+LP+TP+TSH+6AC+CBC/D/PLT
ALBUMIN: 4.2 g/dL (ref 3.5–5.5)
ALK PHOS: 77 IU/L (ref 39–117)
ALT: 19 IU/L (ref 0–32)
AST: 22 IU/L (ref 0–40)
Albumin/Globulin Ratio: 1.6 (ref 1.2–2.2)
BASOS: 1 %
BILIRUBIN TOTAL: 0.3 mg/dL (ref 0.0–1.2)
BUN / CREAT RATIO: 20 (ref 9–23)
BUN: 11 mg/dL (ref 6–24)
Basophils Absolute: 0 10*3/uL (ref 0.0–0.2)
CHLORIDE: 95 mmol/L — AB (ref 96–106)
CHOLESTEROL TOTAL: 164 mg/dL (ref 100–199)
CREATININE: 0.55 mg/dL — AB (ref 0.57–1.00)
Calcium: 8.6 mg/dL — ABNORMAL LOW (ref 8.7–10.2)
Chol/HDL Ratio: 2.8 ratio units (ref 0.0–4.4)
EOS (ABSOLUTE): 0.2 10*3/uL (ref 0.0–0.4)
EOS: 3 %
Free Thyroxine Index: 2.1 (ref 1.2–4.9)
GFR calc Af Amer: 119 mL/min/{1.73_m2} (ref >59)
GFR, EST NON AFRICAN AMERICAN: 103 mL/min/{1.73_m2} (ref >59)
GGT: 20 IU/L (ref 0–60)
GLUCOSE: 87 mg/dL (ref 65–99)
Globulin, Total: 2.6 g/dL (ref 1.5–4.5)
HDL: 59 mg/dL (ref >39)
HEMATOCRIT: 38.5 % (ref 34.0–46.6)
HEMOGLOBIN: 12.8 g/dL (ref 11.1–15.9)
IMMATURE GRANULOCYTES: 0 %
Immature Grans (Abs): 0 10*3/uL (ref 0.0–0.1)
Iron: 83 ug/dL (ref 27–159)
LDH: 202 IU/L (ref 119–226)
LDL CALC: 90 mg/dL (ref 0–99)
LYMPHS ABS: 2.2 10*3/uL (ref 0.7–3.1)
Lymphs: 33 %
MCH: 29.6 pg (ref 26.6–33.0)
MCHC: 33.2 g/dL (ref 31.5–35.7)
MCV: 89 fL (ref 79–97)
MONOS ABS: 0.4 10*3/uL (ref 0.1–0.9)
Monocytes: 7 %
NEUTROS ABS: 3.6 10*3/uL (ref 1.4–7.0)
Neutrophils: 56 %
POTASSIUM: 4.3 mmol/L (ref 3.5–5.2)
Phosphorus: 4.1 mg/dL (ref 2.5–4.5)
Platelets: 228 10*3/uL (ref 150–379)
RBC: 4.32 x10E6/uL (ref 3.77–5.28)
RDW: 14.1 % (ref 12.3–15.4)
SODIUM: 132 mmol/L — AB (ref 134–144)
T3 Uptake Ratio: 27 % (ref 24–39)
T4, Total: 7.9 ug/dL (ref 4.5–12.0)
TSH: 0.464 u[IU]/mL (ref 0.450–4.500)
Total Protein: 6.8 g/dL (ref 6.0–8.5)
Triglycerides: 74 mg/dL (ref 0–149)
URIC ACID: 3.9 mg/dL (ref 2.5–7.1)
VLDL CHOLESTEROL CAL: 15 mg/dL (ref 5–40)
WBC: 6.4 10*3/uL (ref 3.4–10.8)

## 2016-05-25 LAB — HGB A1C W/O EAG: HEMOGLOBIN A1C: 5.5 % (ref 4.8–5.6)

## 2016-05-28 NOTE — Progress Notes (Signed)
Intentional wt loss. Unsure of amount. Just notices clothes fitting better. Drinking more water in relation to this, unsure of amount. Used to drink a powerade a week to regulate electrolytes. Sts she will go back to this to see if levels normalize. Does not add salt to foods. Takes a calcium/Vit D supplement M-F. Denies hyponatremia or hypocalcemia sx. Making appt with PCP for this month. Will f/u with RN if additional orders received/requested.

## 2016-06-21 ENCOUNTER — Telehealth: Payer: Self-pay | Admitting: Family Medicine

## 2016-06-21 DIAGNOSIS — K219 Gastro-esophageal reflux disease without esophagitis: Secondary | ICD-10-CM

## 2016-06-21 NOTE — Telephone Encounter (Signed)
Pt calling for a refill on her synthroid prilosec and simvastatin please respond

## 2016-06-22 MED ORDER — SIMVASTATIN 40 MG PO TABS: 40.0000 mg | ORAL_TABLET | Freq: Every day | ORAL | 0 refills | Status: DC

## 2016-06-22 MED ORDER — OMEPRAZOLE 40 MG PO CPDR: 40.0000 mg | DELAYED_RELEASE_CAPSULE | Freq: Every day | ORAL | 0 refills | Status: DC

## 2016-06-22 MED ORDER — LEVOTHYROXINE SODIUM 100 MCG PO TABS: 150.0000 ug | ORAL_TABLET | Freq: Every day | ORAL | 0 refills | Status: DC

## 2016-07-08 ENCOUNTER — Ambulatory Visit: Payer: Self-pay | Admitting: *Deleted

## 2016-07-08 DIAGNOSIS — S0101XA Laceration without foreign body of scalp, initial encounter: Secondary | ICD-10-CM

## 2016-07-08 NOTE — Progress Notes (Signed)
Pt reports unpacking a crate and raised her head into a nail that was sticking out of a pallet behind her. Presents with an approx 1/2inc laceration to R medial parietal aspect of head. 1-68mm deep. Oozing, bleeding controlled with manual pressure. Pt denies dizziness, nausea, HA, LOC. Site TTP. Mild swelling present. Tdap booster received 05/23/15 at PCP office during annual physical per Epic. Sent for eval to [facility] due to being an open wound. HR rep Crisoforo Oxford made aware and providing approval letter for pt to take to [facility] clinic. Clinic called and mesg left with pt name, DOB, and reason for appt.

## 2016-08-07 ENCOUNTER — Encounter: Payer: Self-pay | Admitting: Physician Assistant

## 2016-08-07 ENCOUNTER — Ambulatory Visit (INDEPENDENT_AMBULATORY_CARE_PROVIDER_SITE_OTHER): Payer: No Typology Code available for payment source | Admitting: Physician Assistant

## 2016-08-07 VITALS — BP 117/85 | HR 99 | Temp 98.4°F | Resp 16 | Ht 65.0 in | Wt 168.4 lb

## 2016-08-07 DIAGNOSIS — E039 Hypothyroidism, unspecified: Secondary | ICD-10-CM

## 2016-08-07 DIAGNOSIS — K219 Gastro-esophageal reflux disease without esophagitis: Secondary | ICD-10-CM | POA: Diagnosis not present

## 2016-08-07 DIAGNOSIS — E785 Hyperlipidemia, unspecified: Secondary | ICD-10-CM | POA: Diagnosis not present

## 2016-08-07 DIAGNOSIS — F419 Anxiety disorder, unspecified: Secondary | ICD-10-CM | POA: Diagnosis not present

## 2016-08-07 DIAGNOSIS — Z6828 Body mass index (BMI) 28.0-28.9, adult: Secondary | ICD-10-CM

## 2016-08-07 DIAGNOSIS — F329 Major depressive disorder, single episode, unspecified: Secondary | ICD-10-CM

## 2016-08-07 MED ORDER — OMEPRAZOLE 40 MG PO CPDR: 40.0000 mg | DELAYED_RELEASE_CAPSULE | Freq: Every day | ORAL | 3 refills | Status: DC

## 2016-08-07 MED ORDER — SIMVASTATIN 40 MG PO TABS: 40.0000 mg | ORAL_TABLET | Freq: Every day | ORAL | 3 refills | Status: DC

## 2016-08-07 MED ORDER — LEVOTHYROXINE SODIUM 100 MCG PO TABS: 150.0000 ug | ORAL_TABLET | Freq: Every day | ORAL | 3 refills | Status: DC

## 2016-08-07 NOTE — Assessment & Plan Note (Signed)
Has lost 9 lbs since 09/2015 with healthier eating and exercise. Encouraged continued healthy lifestyle.

## 2016-08-07 NOTE — Assessment & Plan Note (Signed)
Normal labs 04/2016. Continue current levothyroxine dose.

## 2016-08-07 NOTE — Assessment & Plan Note (Signed)
I am not experienced with anafranil and quetiapine. Encouraged her to see the new psychiatrist. If the new person is not a good fit, I will help her find an alternative, and prescribe to fill the gap if needed.

## 2016-08-07 NOTE — Assessment & Plan Note (Signed)
LDL 90, which is above goal for diabetes, but with A1C of 5.5%, elect to continue current simvastatin dose of 40 mg. Continued lifestyle changes may improve.

## 2016-08-07 NOTE — Assessment & Plan Note (Signed)
Try again to reduce omeprazole. First, alternate 40-20-40-20. If she can tolerate that for several weeks, reduce to 20 mg daily. Goal is to switch over to H2 blocker.

## 2016-08-07 NOTE — Patient Instructions (Addendum)
Try reducing the omeprazole again. First, alternate 40 mg and 20 mg. Then try 20 mg daily. Hopefully we can switch you to Zantac (ranitidine) instead for the long term.    IF you received an x-ray today, you will receive an invoice from [facility]. Please contact [facility] at [phone] with questions or concerns regarding your invoice.   IF you received labwork today, you will receive an invoice from [facility]. Please contact [facility] at [phone] with questions or concerns regarding your invoice.   Our billing staff will not be able to assist you with questions regarding bills from these companies.  You will be contacted with the lab results as soon as they are available. The fastest way to get your results is to activate your My Chart account. Instructions are located on the last page of this paperwork. If you have not heard from us regarding the results in 2 weeks, please contact this office.

## 2016-08-07 NOTE — Progress Notes (Signed)
Patient ID: Victoria Morales, female    DOB: [birth year], 61 y.o.   MRN: [id number]  PCP: Porfirio Oar, PA-C  Chief Complaint  Patient presents with  . Medication Refill    PRINTED COPY OF PRESCRIPTION    Subjective:   Presents for medication refills.  It has been a year since out last visit.  Had labs done at work in February. Those are in [facility], and are reviewed. Glucose was 87. A1C 5.5%. Sodium was a little low at 132 and Chloride at 95, Ca was 8.6. Cholesterol profile is generally good, LDL is 90. TSH 0.464 and Total T4 normal at 7.9. CBC was normal.  Notes that she has lost a little weight with increased exercise. No alcohol x 18 months. Prior to that, drank a glass each night. Has increased water consumption, and some unsweet tea. Has noticed some improvement in her constipation.  Feels great.  Dr. Nolen Mu has left. Hasn't seen the new psychiatrist yet. Would prefer that I write her prescriptions for paroxetine, anafranil and quetiapine.  She continues to work at [employer], were she has been employed x 23 years. "I love going to work." When not working, she enjoys taking photographs, especially at the [employer].   Review of Systems  Constitutional: Negative for activity change, appetite change, fatigue and unexpected weight change (intentional with lifestyle cahnges).  HENT: Negative for congestion, dental problem, ear pain, hearing loss, mouth sores, postnasal drip, rhinorrhea, sneezing, sore throat, tinnitus and trouble swallowing.   Eyes: Negative for photophobia, pain, redness and visual disturbance.  Respiratory: Negative for cough, chest tightness and shortness of breath.   Cardiovascular: Negative for chest pain, palpitations and leg swelling.  Gastrointestinal: Negative for abdominal pain, blood in stool, constipation, diarrhea, nausea and vomiting.  Endocrine: Negative for cold intolerance, heat intolerance, polydipsia, polyphagia and polyuria.    Genitourinary: Negative for dysuria, frequency, hematuria and urgency.  Musculoskeletal: Negative for arthralgias, gait problem, myalgias and neck stiffness.  Skin: Negative for rash.  Neurological: Negative for dizziness, speech difficulty, weakness, light-headedness, numbness and headaches.  Hematological: Negative for adenopathy.  Psychiatric/Behavioral: Negative for confusion and sleep disturbance. The patient is not nervous/anxious.        Patient Active Problem List   Diagnosis Date Noted  . Murmur 06/23/2015  . GERD (gastroesophageal reflux disease) 05/23/2015  . Hyperlipidemia 05/23/2015  . Hypothyroidism 05/23/2015  . Anxiety and depression 05/23/2015  . BMI 31.0-31.9,adult 05/23/2015     Prior to Admission medications   Medication Sig Start Date End Date Taking? Authorizing Provider  aspirin EC 81 MG tablet Take 81 mg by mouth daily.   Yes [provider]  Calcium Carbonate-Vitamin D 600-200 MG-UNIT CAPS Take 1 tablet by mouth daily.    Yes [provider]  clomiPRAMINE (ANAFRANIL) 50 MG capsule Take 50 mg by mouth at bedtime.   Yes [provider]  Fexofenadine-Pseudoephedrine (ALLEGRA-D PO) Take by mouth.   Yes [provider]  Fish Oil OIL Take 1,000 mg by mouth daily.    Yes [provider]  levothyroxine (SYNTHROID, LEVOTHROID) 100 MCG tablet Take 1.5 tablets (150 mcg total) by mouth daily before breakfast. 06/22/16  Yes Lua Feng, PA-C  Misc Natural Products (GLUCOSAMINE CHONDROITIN VIT D3 PO) Take 2 tablets by mouth daily.   Yes [provider]  montelukast (SINGULAIR) 10 MG tablet Take 10 mg by mouth at bedtime.   Yes [provider]  Multiple Vitamin (MULTIVITAMIN WITH MINERALS) TABS tablet Take  1 tablet by mouth daily.   Yes [provider]  omeprazole (PRILOSEC) 40 MG capsule Take 1 capsule (40 mg total) by mouth daily. 06/22/16  Yes Tahje Borawski, PA-C  PARoxetine (PAXIL) 40 MG  tablet Take 40 mg by mouth daily.    Yes [provider]  QUEtiapine (SEROQUEL) 300 MG tablet Take 300 mg by mouth at bedtime.   Yes [provider]  simvastatin (ZOCOR) 40 MG tablet Take 1 tablet (40 mg total) by mouth daily. 06/22/16  Yes Zae Kirtz, PA-C  vitamin C (ASCORBIC ACID) 500 MG tablet Take 500 mg by mouth daily.   Yes [provider]     Allergies  Allergen Reactions  . Penicillins Rash    Has patient had a PCN reaction causing immediate rash, facial/tongue/throat swelling, SOB or lightheadedness with hypotension: No Has patient had a PCN reaction causing severe rash involving mucus membranes or skin necrosis: No Has patient had a PCN reaction that required hospitalization No Has patient had a PCN reaction occurring within the last 10 years: No If all of the above answers are "NO", then may proceed with Cephalosporin use.        Objective:  Physical Exam  Constitutional: She is oriented to person, place, and time. She appears well-developed and well-nourished. She is active and cooperative. No distress.  BP 117/85 (BP Location: Right Arm, Patient Position: Sitting, Cuff Size: Normal)   Pulse 99   Temp 98.4 F (36.9 C) (Oral)   Resp 16   Ht 5\' 5"  (1.651 m)   Wt 168 lb 6.4 oz (76.4 kg)   SpO2 95%   BMI 28.02 kg/m   HENT:  Head: Normocephalic and atraumatic.  Right Ear: Hearing normal.  Left Ear: Hearing normal.  Eyes: Conjunctivae are normal. No scleral icterus.  Neck: Normal range of motion. Neck supple. No thyromegaly present.  Cardiovascular: Normal rate, regular rhythm and normal heart sounds.   Murmur: murmur not appreciated. Pulses:      Radial pulses are 2+ on the right side, and 2+ on the left side.  Pulmonary/Chest: Effort normal and breath sounds normal.  Lymphadenopathy:       Head (right side): No tonsillar, no preauricular, no posterior auricular and no occipital adenopathy present.       Head (left side): No  tonsillar, no preauricular, no posterior auricular and no occipital adenopathy present.    She has no cervical adenopathy.       Right: No supraclavicular adenopathy present.       Left: No supraclavicular adenopathy present.  Neurological: She is alert and oriented to person, place, and time. No sensory deficit.  Skin: Skin is warm, dry and intact. No rash noted. No cyanosis or erythema. Nails show no clubbing.  Psychiatric: She has a normal mood and affect. Her speech is normal and behavior is normal.    Wt Readings from Last 3 Encounters:  08/07/16 168 lb 6.4 oz (76.4 kg)  03/15/16 176 lb (79.8 kg)  03/13/16 177 lb 12.8 oz (80.6 kg)      Assessment & Plan:   Problem List Items Addressed This Visit    GERD (gastroesophageal reflux disease)    Try again to reduce omeprazole. First, alternate 40-20-40-20. If she can tolerate that for several weeks, reduce to 20 mg daily. Goal is to switch over to H2 blocker.      Relevant Medications   omeprazole (PRILOSEC) 40 MG capsule   Hyperlipidemia    LDL 90, which  is above goal for diabetes, but with A1C of 5.5%, elect to continue current simvastatin dose of 40 mg. Continued lifestyle changes may improve.      Relevant Medications   simvastatin (ZOCOR) 40 MG tablet   Hypothyroidism - Primary    Normal labs 04/2016. Continue current levothyroxine dose.      Relevant Medications   levothyroxine (SYNTHROID, LEVOTHROID) 100 MCG tablet   Anxiety and depression    I am not experienced with anafranil and quetiapine. Encouraged her to see the new psychiatrist. If the new person is not a good fit, I will help her find an alternative, and prescribe to fill the gap if needed.      BMI 28.0-28.9,adult    Has lost 9 lbs since 09/2015 with healthier eating and exercise. Encouraged continued healthy lifestyle.          Return in about 1 year (around 08/07/2017) for re-evaluation and wellness visit.   Fernande Brashelle S. Finlee Milo, PA-C Primary Care at  [facility]

## 2016-12-09 ENCOUNTER — Ambulatory Visit: Payer: Self-pay | Admitting: *Deleted

## 2016-12-09 VITALS — BP 126/94 | Ht 64.0 in | Wt 171.0 lb

## 2016-12-09 DIAGNOSIS — R7989 Other specified abnormal findings of blood chemistry: Secondary | ICD-10-CM

## 2016-12-09 DIAGNOSIS — Z0000 Encounter for general adult medical examination without abnormal findings: Secondary | ICD-10-CM

## 2016-12-09 NOTE — Progress Notes (Signed)
[employer] insurance premium discount evaluation: Labs Drawn. Replacements ROI form signed. Tobacco Free Attestation form signed.  Forms placed in paper chart. Okay to route results to pcp per pt. 

## 2016-12-10 LAB — CMP12+LP+TP+TSH+6AC+CBC/D/PLT
ALT: 19 IU/L (ref 0–32)
AST: 22 IU/L (ref 0–40)
Albumin/Globulin Ratio: 1.7 (ref 1.2–2.2)
Albumin: 4.3 g/dL (ref 3.6–4.8)
Alkaline Phosphatase: 75 IU/L (ref 39–117)
BUN/Creatinine Ratio: 18 (ref 12–28)
BUN: 13 mg/dL (ref 8–27)
Basophils Absolute: 0.1 10*3/uL (ref 0.0–0.2)
Basos: 1 %
Bilirubin Total: 0.3 mg/dL (ref 0.0–1.2)
CALCIUM: 8.9 mg/dL (ref 8.7–10.3)
CHOL/HDL RATIO: 3.2 ratio (ref 0.0–4.4)
CREATININE: 0.72 mg/dL (ref 0.57–1.00)
Chloride: 94 mmol/L — ABNORMAL LOW (ref 96–106)
Cholesterol, Total: 159 mg/dL (ref 100–199)
EOS (ABSOLUTE): 0.3 10*3/uL (ref 0.0–0.4)
Eos: 5 %
Estimated CHD Risk: <0.5 times avg. (ref 0.0–1.0)
Free Thyroxine Index: 1.7 (ref 1.2–4.9)
GFR calc Af Amer: 105 mL/min/{1.73_m2} (ref >59)
GFR, EST NON AFRICAN AMERICAN: 91 mL/min/{1.73_m2} (ref >59)
GGT: 18 IU/L (ref 0–60)
GLOBULIN, TOTAL: 2.5 g/dL (ref 1.5–4.5)
Glucose: 85 mg/dL (ref 65–99)
HDL: 49 mg/dL (ref >39)
Hematocrit: 39.1 % (ref 34.0–46.6)
Hemoglobin: 13 g/dL (ref 11.1–15.9)
Immature Grans (Abs): 0 10*3/uL (ref 0.0–0.1)
Immature Granulocytes: 0 %
Iron: 91 ug/dL (ref 27–159)
LDH: 196 IU/L (ref 119–226)
LDL Calculated: 89 mg/dL (ref 0–99)
Lymphocytes Absolute: 1.9 10*3/uL (ref 0.7–3.1)
Lymphs: 27 %
MCH: 29.8 pg (ref 26.6–33.0)
MCHC: 33.2 g/dL (ref 31.5–35.7)
MCV: 90 fL (ref 79–97)
MONOS ABS: 0.5 10*3/uL (ref 0.1–0.9)
Monocytes: 8 %
NEUTROS ABS: 4.2 10*3/uL (ref 1.4–7.0)
Neutrophils: 59 %
PHOSPHORUS: 4.4 mg/dL (ref 2.5–4.5)
POTASSIUM: 4.3 mmol/L (ref 3.5–5.2)
Platelets: 225 10*3/uL (ref 150–379)
RBC: 4.36 x10E6/uL (ref 3.77–5.28)
RDW: 13 % (ref 12.3–15.4)
Sodium: 131 mmol/L — ABNORMAL LOW (ref 134–144)
T3 Uptake Ratio: 23 % — ABNORMAL LOW (ref 24–39)
T4 TOTAL: 7.6 ug/dL (ref 4.5–12.0)
TRIGLYCERIDES: 103 mg/dL (ref 0–149)
TSH: 0.985 u[IU]/mL (ref 0.450–4.500)
Total Protein: 6.8 g/dL (ref 6.0–8.5)
URIC ACID: 4.1 mg/dL (ref 2.5–7.1)
VLDL Cholesterol Cal: 21 mg/dL (ref 5–40)
WBC: 7.1 10*3/uL (ref 3.4–10.8)

## 2016-12-10 LAB — VITAMIN D 25 HYDROXY (VIT D DEFICIENCY, FRACTURES): Vit D, 25-Hydroxy: 42.4 ng/mL (ref 30.0–100.0)

## 2016-12-10 LAB — HGB A1C W/O EAG: Hgb A1c MFr Bld: 5.6 % (ref 4.8–5.6)

## 2016-12-12 NOTE — Progress Notes (Signed)
Results reviewed with pt. Sodium and chloride decreased, similar to previous. Creatinine improved to WNL. T3 uptake slightly decreased. Diet and exercise recommendations for wt management (BMI 29) and HTN discussed. BP rechecked today, 126/87. Routine f/u with PCP. Copy of labs provided to pt. Results routed to pcp. No further questions/concerns.

## 2016-12-24 ENCOUNTER — Ambulatory Visit: Payer: Self-pay | Admitting: Registered Nurse

## 2016-12-24 VITALS — BP 132/86 | HR 92 | Temp 98.5°F

## 2016-12-24 DIAGNOSIS — H6121 Impacted cerumen, right ear: Secondary | ICD-10-CM

## 2016-12-24 DIAGNOSIS — J209 Acute bronchitis, unspecified: Secondary | ICD-10-CM

## 2016-12-24 DIAGNOSIS — J0120 Acute ethmoidal sinusitis, unspecified: Secondary | ICD-10-CM

## 2016-12-24 MED ORDER — MONTELUKAST SODIUM 10 MG PO TABS: 10.0000 mg | ORAL_TABLET | Freq: Every day | ORAL | 3 refills | Status: DC

## 2016-12-24 MED ORDER — SALINE SPRAY 0.65 % NA SOLN: 2.0000 | NASAL | 0 refills | Status: DC

## 2016-12-24 MED ORDER — CEPHALEXIN 500 MG PO CAPS: 500.0000 mg | ORAL_CAPSULE | Freq: Two times a day (BID) | ORAL | 0 refills | Status: AC

## 2016-12-24 MED ORDER — FLUTICASONE PROPIONATE 50 MCG/ACT NA SUSP: 1.0000 | Freq: Two times a day (BID) | NASAL | 0 refills | Status: DC

## 2016-12-24 NOTE — Progress Notes (Addendum)
Subjective:    Patient ID: Victoria Morales, female    DOB: [birth year], 61 y.o.   MRN: [id number]  61y/o caucasian single female established patient Pt reports nasal and chest congestion, post nasal drip x4 days. Using an generic zyrtec at home. Has not been using Flonase. Sts it burned left nostril too much. Is going out of town next week driving to [place] and wants to get sx resolved. Mild nonproductive cough. Headache maxillary and pressure behind bridge of nose.  Cough productive in am.  Was given Rx for singulair last Dec 2017 and cannot remember if it helped along with course of doxycycline and keflex but cough persisted was seen at [facility] and given prednisone which made her feel crazy.  Denied wheezing/hemoptysis/nausea/vomiting/ear discharge.      Review of Systems  Constitutional: Negative for activity change, appetite change, chills, diaphoresis, fatigue, fever and unexpected weight change.  HENT: Positive for congestion, postnasal drip, rhinorrhea, sinus pain and sinus pressure. Negative for dental problem, drooling, ear discharge, ear pain, facial swelling, hearing loss, mouth sores, nosebleeds, sneezing, sore throat, tinnitus, trouble swallowing and voice change.   Eyes: Negative for photophobia, pain, discharge, redness, itching and visual disturbance.  Respiratory: Positive for cough. Negative for choking, chest tightness, shortness of breath, wheezing and stridor.   Cardiovascular: Negative for chest pain, palpitations and leg swelling.  Gastrointestinal: Negative for abdominal distention, abdominal pain, blood in stool, constipation, diarrhea, nausea and vomiting.  Endocrine: Negative for cold intolerance and heat intolerance.  Genitourinary: Negative for difficulty urinating, dysuria and hematuria.  Musculoskeletal: Negative for arthralgias, back pain, gait problem, joint swelling, myalgias, neck pain and neck stiffness.  Skin: Negative for color change, pallor, rash  and wound.  Allergic/Immunologic: Positive for environmental allergies. Negative for food allergies.  Neurological: Positive for headaches. Negative for dizziness, tremors, seizures, syncope, facial asymmetry, speech difficulty, weakness, light-headedness and numbness.  Hematological: Negative for adenopathy. Does not bruise/bleed easily.  Psychiatric/Behavioral: Negative for agitation, behavioral problems, confusion and sleep disturbance.       Objective:   Physical Exam  Constitutional: She is oriented to person, place, and time. Vital signs are normal. She appears well-developed and well-nourished. She is active and cooperative.  Non-toxic appearance. She does not have a sickly appearance. She does not appear ill. No distress.  HENT:  Head: Normocephalic and atraumatic.  Right Ear: Hearing, external ear and ear canal normal. A middle ear effusion is present.  Left Ear: Hearing, external ear and ear canal normal.  Nose: Mucosal edema and rhinorrhea present. No nose lacerations, sinus tenderness, nasal deformity, septal deviation or nasal septal hematoma. No epistaxis.  No foreign bodies. Right sinus exhibits no maxillary sinus tenderness and no frontal sinus tenderness. Left sinus exhibits no maxillary sinus tenderness and no frontal sinus tenderness.  Mouth/Throat: Uvula is midline and mucous membranes are normal. Mucous membranes are not pale, not dry and not cyanotic. She does not have dentures. No oral lesions. No trismus in the jaw. Normal dentition. No dental abscesses, uvula swelling, lacerations or dental caries. Posterior oropharyngeal edema and posterior oropharyngeal erythema present. No oropharyngeal exudate or tonsillar abscesses.  Cobblestoning posterior pharynx; left TM air fluid level clear; right TM obstructed by gold/brown hard cerumen adjacent to TM; bilateral allergic shiners; congested voice; bilateral nasal turbinates edema/erythema clear discharge  Eyes: Pupils are equal,  round, and reactive to light. Conjunctivae, EOM and lids are normal. Right eye exhibits no chemosis, no discharge, no exudate and no hordeolum. No  foreign body present in the right eye. Left eye exhibits no chemosis, no discharge, no exudate and no hordeolum. No foreign body present in the left eye. Right conjunctiva is not injected. Right conjunctiva has no hemorrhage. Left conjunctiva is not injected. Left conjunctiva has no hemorrhage. No scleral icterus. Right eye exhibits normal extraocular motion and no nystagmus. Left eye exhibits normal extraocular motion and no nystagmus. Right pupil is round and reactive. Left pupil is round and reactive. Pupils are equal.  Neck: Trachea normal, normal range of motion and phonation normal. Neck supple. No tracheal tenderness and no muscular tenderness present. No neck rigidity. No tracheal deviation, no edema, no erythema and normal range of motion present. No thyroid mass and no thyromegaly present.  Spoke full sentences without difficulty  Cardiovascular: Normal rate, regular rhythm, S1 normal, S2 normal, normal heart sounds and intact distal pulses.  PMI is not displaced.  Exam reveals no gallop and no friction rub.   No murmur heard. Pulmonary/Chest: Effort normal and breath sounds normal. No accessory muscle usage or stridor. No respiratory distress. She has no decreased breath sounds. She has no wheezes. She has no rhonchi. She has no rales. She exhibits no tenderness.  Throat clearing in clinic but no cough observed  Abdominal: Normal appearance. She exhibits no distension and no fluid wave. There is no rigidity and no guarding.  Musculoskeletal: Normal range of motion. She exhibits no edema or tenderness.       Right shoulder: Normal.       Left shoulder: Normal.       Right elbow: Normal.      Left elbow: Normal.       Right hip: Normal.       Left hip: Normal.       Right knee: Normal.       Left knee: Normal.       Cervical back: Normal.        Thoracic back: Normal.       Lumbar back: Normal.       Right hand: Normal.       Left hand: Normal.  Lymphadenopathy:       Head (right side): No submental, no submandibular, no tonsillar, no preauricular, no posterior auricular and no occipital adenopathy present.       Head (left side): No submental, no submandibular, no tonsillar, no preauricular, no posterior auricular and no occipital adenopathy present.    She has no cervical adenopathy.       Right cervical: No superficial cervical, no deep cervical and no posterior cervical adenopathy present.      Left cervical: No superficial cervical, no deep cervical and no posterior cervical adenopathy present.  Neurological: She is alert and oriented to person, place, and time. She has normal strength. She is not disoriented. She displays no atrophy and no tremor. No cranial nerve deficit or sensory deficit. She exhibits normal muscle tone. She displays no seizure activity. Coordination and gait normal. GCS eye subscore is 4. GCS verbal subscore is 5. GCS motor subscore is 6.  On/off exam table; in/out of chair without difficulty; gait sure and steady in hall  Skin: Skin is warm, dry and intact. No abrasion, no bruising, no burn, no ecchymosis, no laceration, no lesion, no petechiae and no rash noted. She is not diaphoretic. No cyanosis or erythema. No pallor. Nails show no clubbing.  Psychiatric: She has a normal mood and affect. Her speech is normal and behavior is normal. Judgment and  thought content normal. Cognition and memory are normal.  Nursing note and vitals reviewed.         Assessment & Plan:  A-acute nonrecurrent ethmoid sinusitis, acute bronchitis, right cerumen impaction  P-Rx Keflex  po BID x 10 days #30 RF0 dispensed from [facility].  Allergic to penicillin; concerned about sunburn leaving for vacation tonight in [place].  Start singulair  po qhs #30 RF3 electronic Rx to pharmacy of choice. Cough drops po q2h prn cough.   Bronchitis simple, community acquired, may have started as viral (probably respiratory syncytial, parainfluenza, influenza, or adenovirus), but now evidence of acute purulent bronchitis with resultant bronchial edema and mucus formation.  Viruses are the most common cause of bronchial inflammation in otherwise healthy adults with acute bronchitis.  The appearance of sputum is not predictive of whether a bacterial infection is present.  Purulent sputum is most often caused by viral infections.  There are a small portion of those caused by non-viral agents being Mycoplama pneumonia.  Microscopic examination or C&S of sputum in the healthy adult with acute bronchitis is generally not helpful (usually negative or normal respiratory flora) other considerations being cough from upper respiratory tract infections, sinusitis or allergic syndromes (mild asthma or viral pneumonia).  Differential Diagnoses:  reactive airway disease (asthma, allergic aspergillosis (eosinophilia), chronic bronchitis, respiratory infection (sinusitis, common cold, pneumonia), congestive heart failure, reflux esophagitis, bronchogenic tumor, aspiration syndromes and/or exposure to pulmonary irritants/smoke.   Without high fever, severe dyspnea, lack of physical findings or other risk factors, I will hold on a chest radiograph and CBC at this time.  I discussed that approximately 50% of patients with acute bronchitis have a cough that lasts up to three weeks, and 25% for over a month.  Tylenol  one to two tablets every four to six hours as needed for fever or myalgias.  No aspirin. Exitcare handout on bronchitis.  ER if hemopthysis, SOB, worst chest pain of life.   Patient instructed to follow up in one week or sooner if symptoms worsen.  Patient verbalized agreement and understanding of treatment plan.  P2:  hand washing and cover cough  restart use normal saline nasal spray 2 sprays each nostril q2h wa as needed. flonase 1 spray each  nostril BID #1 RF0.  Patient denied personal or family history of ENT cancer.  OTC antihistamine of choice zyrtec  po daily.  Rx singulair  po qhs #30 RF3 electronic to pharmacy of choice.  Avoid triggers if possible.  Shower prior to bedtime if exposed to triggers.  If allergic dust/dust mites recommend mattress/pillow covers/encasements; washing linens, vacuuming, sweeping, dusting weekly.  Call or return to clinic as needed if these symptoms worsen or fail to improve as anticipated.   Exitcare handout on sinus rinse given to patient.  Patient verbalized understanding of instructions, agreed with plan of care and had no further questions at this time.   Try mineral oil on cotton ball (soaked) weekly in ear for 20 minutes and when in clinic have provider check ears to see if buildup with this method again. Discussed purpose of earwax with patient. Avoid cotton applicator (Q-tip) use in ears. Exitcare handout on cerumen impaction given to patient.  Patient verbalized understanding, agreed with plan of care and had no further questions at this time.  P2:  Avoidance and hand washing.

## 2016-12-24 NOTE — Patient Instructions (Addendum)
Sinus Rinse What is a sinus rinse? A sinus rinse is a simple home treatment that is used to rinse your sinuses with a sterile mixture of salt and water (saline solution). Sinuses are air-filled spaces in your skull behind the bones of your face and forehead that open into your nasal cavity. You will use the following:  Saline solution.  Neti pot or spray bottle. This releases the saline solution into your nose and through your sinuses. Neti pots and spray bottles can be purchased at [employer], a health food store, or online.  When would I do a sinus rinse? A sinus rinse can help to clear mucus, dirt, dust, or pollen from the nasal cavity. You may do a sinus rinse when you have a cold, a virus, nasal allergy symptoms, a sinus infection, or stuffiness in the nose or sinuses. If you are considering a sinus rinse:  Ask your child's health care provider before performing a sinus rinse on your child.  Do not do a sinus rinse if you have had ear or nasal surgery, ear infection, or blocked ears.  How do I do a sinus rinse?  Wash your hands.  Disinfect your device according to the directions provided and then dry it.  Use the solution that comes with your device or one that is sold separately in stores. Follow the mixing directions on the package.  Fill your device with the amount of saline solution as directed by the device instructions.  Stand over a sink and tilt your head sideways over the sink.  Place the spout of the device in your upper nostril (the one closer to the ceiling).  Gently pour or squeeze the saline solution into the nasal cavity. The liquid should drain to the lower nostril if you are not overly congested.  Gently blow your nose. Blowing too hard may cause ear pain.  Repeat in the other nostril.  Clean and rinse your device with clean water and then air-dry it. Are there risks of a sinus rinse? Sinus rinse is generally very safe and effective. However,  there are a few risks, which include:  A burning sensation in the sinuses. This may happen if you do not make the saline solution as directed. Make sure to follow all directions when making the saline solution.  Infection from contaminated water. This is rare, but possible.  Nasal irritation.  This information is not intended to replace advice given to you by your health care provider. Make sure you discuss any questions you have with your health care provider. Document Released: 10/06/2013 Document Revised: 02/06/2016 Document Reviewed: 07/27/2013 Elsevier Interactive Patient Education  2017 [employer] Acute Bronchitis, Adult Acute bronchitis is sudden (acute) swelling of the air tubes (bronchi) in the lungs. Acute bronchitis causes these tubes to fill with mucus, which can make it hard to breathe. It can also cause coughing or wheezing. In adults, acute bronchitis usually goes away within 2 weeks. A cough caused by bronchitis may last up to 3 weeks. Smoking, allergies, and asthma can make the condition worse. Repeated episodes of bronchitis may cause further lung problems, such as chronic obstructive pulmonary disease (COPD). What are the causes? This condition can be caused by germs and by substances that irritate the lungs, including:  Cold and flu viruses. This condition is most often caused by the same virus that causes a cold.  Bacteria.  Exposure to tobacco smoke, dust, fumes, and air pollution.  What increases the risk? This condition is  more likely to develop in people who:  Have close contact with someone with acute bronchitis.  Are exposed to lung irritants, such as tobacco smoke, dust, fumes, and vapors.  Have a weak immune system.  Have a respiratory condition such as asthma.  What are the signs or symptoms? Symptoms of this condition include:  A cough.  Coughing up clear, yellow, or green mucus.  Wheezing.  Chest congestion.  Shortness of breath.  A  fever.  Body aches.  Chills.  A sore throat.  How is this diagnosed? This condition is usually diagnosed with a physical exam. During the exam, your health care provider may order tests, such as chest X-rays, to rule out other conditions. He or she may also:  Test a sample of your mucus for bacterial infection.  Check the level of oxygen in your blood. This is done to check for pneumonia.  Do a chest X-ray or lung function testing to rule out pneumonia and other conditions.  Perform blood tests.  Your health care provider will also ask about your symptoms and medical history. How is this treated? Most cases of acute bronchitis clear up over time without treatment. Your health care provider may recommend:  Drinking more fluids. Drinking more makes your mucus thinner, which may make it easier to breathe.  Taking a medicine for a fever or cough.  Taking an antibiotic medicine.  Using an inhaler to help improve shortness of breath and to control a cough.  Using a cool mist vaporizer or humidifier to make it easier to breathe.  Follow these instructions at home: Medicines  Take over-the-counter and prescription medicines only as told by your health care provider.  If you were prescribed an antibiotic, take it as told by your health care provider. Do not stop taking the antibiotic even if you start to feel better. General instructions  Get plenty of rest.  Drink enough fluids to keep your urine clear or pale yellow.  Avoid smoking and secondhand smoke. Exposure to cigarette smoke or irritating chemicals will make bronchitis worse. If you smoke and you need help quitting, ask your health care provider. Quitting smoking will help your lungs heal faster.  Use an inhaler, cool mist vaporizer, or humidifier as told by your health care provider.  Keep all follow-up visits as told by your health care provider. This is important. How is this prevented? To lower your risk of  getting this condition again:  Wash your hands often with soap and water. If soap and water are not available, use hand sanitizer.  Avoid contact with people who have cold symptoms.  Try not to touch your hands to your mouth, nose, or eyes.  Make sure to get the flu shot every year.  Contact a health care provider if:  Your symptoms do not improve in 2 weeks of treatment. Get help right away if:  You cough up blood.  You have chest pain.  You have severe shortness of breath.  You become dehydrated.  You faint or keep feeling like you are going to faint.  You keep vomiting.  You have a severe headache.  Your fever or chills gets worse. This information is not intended to replace advice given to you by your health care provider. Make sure you discuss any questions you have with your health care provider. Document Released: 04/18/2004 Document Revised: 10/04/2015 Document Reviewed: 08/30/2015 Elsevier Interactive Patient Education  2017 [employer] Sinusitis, Adult Sinusitis is soreness and inflammation of your sinuses.  Sinuses are hollow spaces in the bones around your face. Your sinuses are located:  Around your eyes.  In the middle of your forehead.  Behind your nose.  In your cheekbones.  Your sinuses and nasal passages are lined with a stringy fluid (mucus). Mucus normally drains out of your sinuses. When your nasal tissues become inflamed or swollen, the mucus can become trapped or blocked so air cannot flow through your sinuses. This allows bacteria, viruses, and funguses to grow, which leads to infection. Sinusitis can develop quickly and last for 7?10 days (acute) or for more than 12 weeks (chronic). Sinusitis often develops after a cold. What are the causes? This condition is caused by anything that creates swelling in the sinuses or stops mucus from draining, including:  Allergies.  Asthma.  Bacterial or viral infection.  Abnormally shaped bones  between the nasal passages.  Nasal growths that contain mucus (nasal polyps).  Narrow sinus openings.  Pollutants, such as chemicals or irritants in the air.  A foreign object stuck in the nose.  A fungal infection. This is rare.  What increases the risk? The following factors may make you more likely to develop this condition:  Having allergies or asthma.  Having had a recent cold or respiratory tract infection.  Having structural deformities or blockages in your nose or sinuses.  Having a weak immune system.  Doing a lot of swimming or diving.  Overusing nasal sprays.  Smoking.  What are the signs or symptoms? The main symptoms of this condition are pain and a feeling of pressure around the affected sinuses. Other symptoms include:  Upper toothache.  Earache.  Headache.  Bad breath.  Decreased sense of smell and taste.  A cough that may get worse at night.  Fatigue.  Fever.  Thick drainage from your nose. The drainage is often green and it may contain pus (purulent).  Stuffy nose or congestion.  Postnasal drip. This is when extra mucus collects in the throat or back of the nose.  Swelling and warmth over the affected sinuses.  Sore throat.  Sensitivity to light.  How is this diagnosed? This condition is diagnosed based on symptoms, a medical history, and a physical exam. To find out if your condition is acute or chronic, your health care provider may:  Look in your nose for signs of nasal polyps.  Tap over the affected sinus to check for signs of infection.  View the inside of your sinuses using an imaging device that has a light attached (endoscope).  If your health care provider suspects that you have chronic sinusitis, you may also:  Be tested for allergies.  Have a sample of mucus taken from your nose (nasal culture) and checked for bacteria.  Have a mucus sample examined to see if your sinusitis is related to an allergy.  If your  sinusitis does not respond to treatment and it lasts longer than 8 weeks, you may have an MRI or CT scan to check your sinuses. These scans also help to determine how severe your infection is. In rare cases, a bone biopsy may be done to rule out more serious types of fungal sinus disease. How is this treated? Treatment for sinusitis depends on the cause and whether your condition is chronic or acute. If a virus is causing your sinusitis, your symptoms will go away on their own within 10 days. You may be given medicines to relieve your symptoms, including:  Topical nasal decongestants. They shrink swollen nasal  passages and let mucus drain from your sinuses.  Antihistamines. These drugs block inflammation that is triggered by allergies. This can help to ease swelling in your nose and sinuses.  Topical nasal corticosteroids. These are nasal sprays that ease inflammation and swelling in your nose and sinuses.  Nasal saline washes. These rinses can help to get rid of thick mucus in your nose.  If your condition is caused by bacteria, you will be given an antibiotic medicine. If your condition is caused by a fungus, you will be given an antifungal medicine. Surgery may be needed to correct underlying conditions, such as narrow nasal passages. Surgery may also be needed to remove polyps. Follow these instructions at home: Medicines  Take, use, or apply over-the-counter and prescription medicines only as told by your health care provider. These may include nasal sprays.  If you were prescribed an antibiotic medicine, take it as told by your health care provider. Do not stop taking the antibiotic even if you start to feel better. Hydrate and Humidify  Drink enough water to keep your urine clear or pale yellow. Staying hydrated will help to thin your mucus.  Use a cool mist humidifier to keep the humidity level in your home above 50%.  Inhale steam for 10-15 minutes, 3-4 times a day or as told by  your health care provider. You can do this in the bathroom while a hot shower is running.  Limit your exposure to cool or dry air. Rest  Rest as much as possible.  Sleep with your head raised (elevated).  Make sure to get enough sleep each night. General instructions  Apply a warm, moist washcloth to your face 3-4 times a day or as told by your health care provider. This will help with discomfort.  Wash your hands often with soap and water to reduce your exposure to viruses and other germs. If soap and water are not available, use hand sanitizer.  Do not smoke. Avoid being around people who are smoking (secondhand smoke).  Keep all follow-up visits as told by your health care provider. This is important. Contact a health care provider if:  You have a fever.  Your symptoms get worse.  Your symptoms do not improve within 10 days. Get help right away if:  You have a severe headache.  You have persistent vomiting.  You have pain or swelling around your face or eyes.  You have vision problems.  You develop confusion.  Your neck is stiff.  You have trouble breathing. This information is not intended to replace advice given to you by your health care provider. Make sure you discuss any questions you have with your health care provider. Document Released: 03/11/2005 Document Revised: 11/05/2015 Document Reviewed: 01/04/2015 Elsevier Interactive Patient Education  2017 [employer]   Earwax Buildup, Adult The ears produce a substance called earwax that helps keep bacteria out of the ear and protects the skin in the ear canal. Occasionally, earwax can build up in the ear and cause discomfort or hearing loss. What increases the risk? This condition is more likely to develop in people who:  Are female.  Are elderly.  Naturally produce more earwax.  Clean their ears often with cotton swabs.  Use earplugs often.  Use in-ear headphones often.  Wear hearing  aids.  Have narrow ear canals.  Have earwax that is overly thick or sticky.  Have eczema.  Are dehydrated.  Have excess hair in the ear canal.  What are the signs  or symptoms? Symptoms of this condition include:  Reduced or muffled hearing.  A feeling of fullness in the ear or feeling that the ear is plugged.  Fluid coming from the ear.  Ear pain.  Ear itch.  Ringing in the ear.  Coughing.  An obvious piece of earwax that can be seen inside the ear canal.  How is this diagnosed? This condition may be diagnosed based on:  Your symptoms.  Your medical history.  An ear exam. During the exam, your health care provider will look into your ear with an instrument called an otoscope.  You may have tests, including a hearing test. How is this treated? This condition may be treated by:  Using ear drops to soften the earwax.  Having the earwax removed by a health care provider. The health care provider may: ? Flush the ear with water. ? Use an instrument that has a loop on the end (curette). ? Use a suction device.  Surgery to remove the wax buildup. This may be done in severe cases.  Follow these instructions at home:  Take over-the-counter and prescription medicines only as told by your health care provider.  Do not put any objects, including cotton swabs, into your ear. You can clean the opening of your ear canal with a washcloth or facial tissue.  Follow instructions from your health care provider about cleaning your ears. Do not over-clean your ears.  Drink enough fluid to keep your urine clear or pale yellow. This will help to thin the earwax.  Keep all follow-up visits as told by your health care provider. If earwax builds up in your ears often or if you use hearing aids, consider seeing your health care provider for routine, preventive ear cleanings. Ask your health care provider how often you should schedule your cleanings.  If you have hearing aids,  clean them according to instructions from the manufacturer and your health care provider. Contact a health care provider if:  You have ear pain.  You develop a fever.  You have blood, pus, or other fluid coming from your ear.  You have hearing loss.  You have ringing in your ears that does not go away.  Your symptoms do not improve with treatment.  You feel like the room is spinning (vertigo). Summary  Earwax can build up in the ear and cause discomfort or hearing loss.  The most common symptoms of this condition include reduced or muffled hearing and a feeling of fullness in the ear or feeling that the ear is plugged.  This condition may be diagnosed based on your symptoms, your medical history, and an ear exam.  This condition may be treated by using ear drops to soften the earwax or by having the earwax removed by a health care provider.  Do not put any objects, including cotton swabs, into your ear. You can clean the opening of your ear canal with a washcloth or facial tissue. This information is not intended to replace advice given to you by your health care provider. Make sure you discuss any questions you have with your health care provider. Document Released: 04/18/2004 Document Revised: 05/22/2016 Document Reviewed: 05/22/2016 [employer] Interactive Patient Education  [employer].

## 2017-06-05 ENCOUNTER — Ambulatory Visit: Payer: Self-pay | Admitting: Registered Nurse

## 2017-06-05 VITALS — BP 121/88 | HR 98 | Temp 98.6°F | Resp 18

## 2017-06-05 DIAGNOSIS — R251 Tremor, unspecified: Secondary | ICD-10-CM

## 2017-06-05 DIAGNOSIS — J019 Acute sinusitis, unspecified: Secondary | ICD-10-CM

## 2017-06-05 MED ORDER — MONTELUKAST SODIUM 10 MG PO TABS: 10.0000 mg | ORAL_TABLET | Freq: Every day | ORAL | 3 refills | Status: DC

## 2017-06-05 MED ORDER — CEPHALEXIN 500 MG PO CAPS: 500.0000 mg | ORAL_CAPSULE | Freq: Two times a day (BID) | ORAL | 0 refills | Status: AC

## 2017-06-05 NOTE — Progress Notes (Signed)
Subjective:    Patient ID: Victoria Morales, female    DOB: 1955-10-23, 62 y.o.   MRN: [id number]  61y/o caucasian female established patient here for cough/cold symptoms and concerned she may have something to pass to newborn niece.  Allergies restarted flonase but not OTC antihistamine yet has used D version in the past and ran out of singulair needs refill would like 90 days.  + sick contacts at work and spring allergies usually a problem for her also;  Has noticed some tremors avoids adding salt to foods she would also like to know what symptoms of parkinsons are      Review of Systems  Constitutional: Positive for fatigue. Negative for activity change, appetite change, chills, diaphoresis, fever and unexpected weight change.  HENT: Positive for congestion, postnasal drip, rhinorrhea, sinus pressure and sinus pain. Negative for dental problem, drooling, ear discharge, ear pain, facial swelling, hearing loss, mouth sores, nosebleeds, sneezing, sore throat, tinnitus, trouble swallowing and voice change.   Eyes: Negative for photophobia, pain, discharge, redness, itching and visual disturbance.  Respiratory: Positive for cough. Negative for choking, chest tightness, shortness of breath, wheezing and stridor.   Cardiovascular: Negative for chest pain, palpitations and leg swelling.  Gastrointestinal: Negative for abdominal distention, abdominal pain, blood in stool, constipation, diarrhea, nausea and vomiting.  Endocrine: Negative for cold intolerance and heat intolerance.  Genitourinary: Negative for difficulty urinating, dysuria and hematuria.  Musculoskeletal: Negative for arthralgias, back pain, gait problem, joint swelling, myalgias, neck pain and neck stiffness.  Skin: Negative for color change, pallor, rash and wound.  Allergic/Immunologic: Positive for environmental allergies. Negative for food allergies.  Neurological: Positive for tremors and headaches. Negative for dizziness,  seizures, syncope, facial asymmetry, speech difficulty, weakness, light-headedness and numbness.  Hematological: Negative for adenopathy. Does not bruise/bleed easily.  Psychiatric/Behavioral: Negative for agitation, behavioral problems, confusion and sleep disturbance.       Objective:   Physical Exam  Constitutional: She is oriented to person, place, and time. Vital signs are normal. She appears well-developed and well-nourished. She is active and cooperative.  Non-toxic appearance. She does not have a sickly appearance. She appears ill. No distress.  HENT:  Head: Normocephalic and atraumatic.  Right Ear: Hearing, external ear and ear canal normal. A middle ear effusion is present.  Left Ear: Hearing, external ear and ear canal normal. A middle ear effusion is present.  Nose: Mucosal edema and rhinorrhea present. No nose lacerations, sinus tenderness, nasal deformity, septal deviation or nasal septal hematoma. No epistaxis.  No foreign bodies. Right sinus exhibits maxillary sinus tenderness and frontal sinus tenderness. Left sinus exhibits no maxillary sinus tenderness and no frontal sinus tenderness.  Mouth/Throat: Uvula is midline and mucous membranes are normal. Mucous membranes are not pale, not dry and not cyanotic. She does not have dentures. No oral lesions. No trismus in the jaw. Normal dentition. No dental abscesses, uvula swelling, lacerations or dental caries. Posterior oropharyngeal edema and posterior oropharyngeal erythema present. No oropharyngeal exudate or tonsillar abscesses.  Cobblestoning posterior pharynx; bilateral TMs air fluid level clear; hard brown cerumen 12-3 oclock bilaterally; bilateral allergic shiners and pressure with sinus palpations bilaterallly maxillary and frontal; tonsils 1+/4 bilaterally  Eyes: Conjunctivae, EOM and lids are normal. Pupils are equal, round, and reactive to light. Right eye exhibits no chemosis, no discharge, no exudate and no hordeolum. No  foreign body present in the right eye. Left eye exhibits no chemosis, no discharge, no exudate and no hordeolum. No foreign body  present in the left eye. Right conjunctiva is not injected. Right conjunctiva has no hemorrhage. Left conjunctiva is not injected. Left conjunctiva has no hemorrhage. No scleral icterus. Right eye exhibits normal extraocular motion and no nystagmus. Left eye exhibits normal extraocular motion and no nystagmus. Right pupil is round and reactive. Left pupil is round and reactive. Pupils are equal.  Neck: Trachea normal, normal range of motion and phonation normal. Neck supple. No tracheal tenderness and no muscular tenderness present. No neck rigidity. No tracheal deviation, no edema, no erythema and normal range of motion present. No thyroid mass and no thyromegaly present.  Cardiovascular: Normal rate, regular rhythm, S1 normal, S2 normal, normal heart sounds and intact distal pulses. PMI is not displaced. Exam reveals no gallop and no friction rub.  No murmur heard. Pulses:      Radial pulses are 2+ on the right side, and 2+ on the left side.  Pulmonary/Chest: Effort normal and breath sounds normal. No accessory muscle usage or stridor. No respiratory distress. She has no decreased breath sounds. She has no wheezes. She has no rhonchi. She has no rales. She exhibits no tenderness.  Cough not observed in exam room; spoke full sentences without difficulty  Abdominal: Soft. Normal appearance. She exhibits no distension, no fluid wave and no ascites. There is no rigidity.  Musculoskeletal: Normal range of motion. She exhibits no edema or tenderness.       Right shoulder: Normal.       Left shoulder: Normal.       Right elbow: Normal.      Left elbow: Normal.       Right hip: Normal.       Left hip: Normal.       Right knee: Normal.       Left knee: Normal.       Cervical back: Normal.       Thoracic back: Normal.       Lumbar back: Normal.       Right hand: Normal.        Left hand: Normal.  Lymphadenopathy:       Head (right side): No submental, no submandibular, no tonsillar, no preauricular, no posterior auricular and no occipital adenopathy present.       Head (left side): No submental, no submandibular, no tonsillar, no preauricular, no posterior auricular and no occipital adenopathy present.    She has no cervical adenopathy.       Right cervical: No superficial cervical, no deep cervical and no posterior cervical adenopathy present.      Left cervical: No superficial cervical, no deep cervical and no posterior cervical adenopathy present.  Neurological: She is alert and oriented to person, place, and time. She has normal strength. She is not disoriented. She displays no atrophy and no tremor. No cranial nerve deficit or sensory deficit. She exhibits normal muscle tone. She displays no seizure activity. Coordination and gait normal. GCS eye subscore is 4. GCS verbal subscore is 5. GCS motor subscore is 6.  No tremor noted in exam room; bilateral extremity strength 5/5 upper and lower; on/off exam table/in/out of chair without difficulty; gait sure and steady in hallway  Skin: Skin is warm, dry and intact. No abrasion, no bruising, no burn, no ecchymosis, no laceration, no lesion, no petechiae and no rash noted. She is not diaphoretic. No cyanosis or erythema. No pallor. Nails show no clubbing.  Psychiatric: She has a normal mood and affect. Her speech is normal and  behavior is normal. Judgment and thought content normal. Cognition and memory are normal.  Nursing note and vitals reviewed.         Assessment & Plan:  A-Acute rhinosinusitis, tremors  P-Allergic to penicillin keflex 500mg  po BID x 10 days #30 RF0 dispensed from [facility] start if no improvement with antihistamine of her choice once a day claritin/zyrtec 10mg  po x 48 hours.  Restart singulair 10mg  po qhs #90 RF3 electronic Rx to her pharmacy of choice.   continue flonase 1 spray each nostril BID,  saline 2 sprays each nostril q2h wa prn congestion.  If no improvement with 48 hours of saline and flonase use start keflex 500mg  po BID x 10 days #30 RF0 dispensed from [facility] patient has previously taken without side effects  Denied personal or family history of ENT cancer.  Shower BID especially prior to bed. No evidence of systemic bacterial infection, non toxic and well hydrated.  I do not see where any further testing or imaging is necessary at this time.   I will suggest supportive care, rest, good hygiene and encourage the patient to take adequate fluids.  The patient is to return to clinic or EMERGENCY ROOM if symptoms worsen or change significantly.  Exitcare handout on sinusitis and sinus rinse given to patient.  Patient verbalized agreement and understanding of treatment plan and had no further questions at this time.   P2:  Hand washing and cover cough  Patient may use normal saline nasal spray 2 sprays each nostril q2h wa as needed. flonase 50mcg 1 spray each nostril BID  retart singulair 10mg  po qhs #90 RF3 electronic Rx to her pharmacy of choice.  Patient denied personal or family history of ENT cancer.  OTC antihistamine of choice claritin/zyrtec 10mg  po daily.  Avoid triggers if possible.  Shower prior to bedtime if exposed to triggers.  If allergic dust/dust mites recommend mattress/pillow covers/encasements; washing linens, vacuuming, sweeping, dusting weekly.  Call or return to clinic as needed if these symptoms worsen or fail to improve as anticipated.   Exitcare handout on allergic rhinitis and sinus rinse given to patient.  Patient verbalized understanding of instructions, agreed with plan of care and had no further questions at this time.  P2:  Avoidance and hand washing.  New reported tremors in patient with history low sodium, calcium, chloride and low normal TSH on synthroid.Denied family or personal history of tremors/parkinsons/neurological diseases.  Consider repeat labs BMP/TSH if  no improvement with no sugar powerade/gatorade today may repeat daily if tremors reoccur.  Discussed with patient tremors can have multiple causes eg. Electrolytes, low blood sugar, neurological, thyroid disorders, medication side effect, caffeine side effect.  Exitcare handout on tremors given to patient.  Follow up re-valuation if no improvement with plan of care next week.  See a provider sooner if dizzyness, weakness, palpitations, difficulty walking e.g. PCM/UC/ER.  Discussed signs and symptoms of parkinson's disease with patient e.g. Tremor/pill rolling, move slowly (bradykinesias), stiff or rigid movements, lose balance, lose sense of smell, shuffling gait, difficulty thinking or decreased memory, dysarthria and stooped posture most common.  Patient denied all except tremor at rest or moving intermittent.  Encouraged patient to continue taking her thyroid medication as prescribed same time every day.  Patient verbalized understanding information/instructions, agreed with plan of care and had no further questions at this time.

## 2017-06-06 NOTE — Patient Instructions (Addendum)
a Nasal Allergies Nasal allergies are a reaction to allergens in the air. Allergens are particles in the air that cause your body to have an allergic reaction. Nasal allergies are not passed from person to person (are not contagious). They cannot be cured, but they can be controlled. What are the causes? Seasonal nasal allergies (hay fever) are caused by pollen allergens that come from grasses, trees, and weeds. Year-round nasal allergies (perennial allergic rhinitis) are caused by allergens such as house dust mites, pet dander, and mold spores. What increases the risk? The following factors may make you more likely to develop this condition:  Having certain health conditions. These include: ? Other types of allergies, such as food allergies. ? Asthma. ? Eczema.  Having a close relative who has allergies or asthma.  Exposure to house dust, pollen, dander, or other allergens at home or at work.  Exposure to air pollution or secondhand smoke when you were a child.  What are the signs or symptoms? Symptoms of this condition include:  Sneezing.  Runny nose or stuffy nose (congestion).  Watery (tearing) eyes.  Itchy eyes, nose, mouth, throat, skin, or other area.  Sore throat.  Headache.  Decreased sense of smell or taste.  Fatigue. This may occur if you have trouble sleeping due to allergies.  Swollen eyelids.  How is this diagnosed? This condition is diagnosed with a medical history and physical exam. Allergy testing may be done to determine exactly what triggers your nasal allergies. How is this treated? There is no cure for nasal allergies. Treatment focuses on controlling your symptoms, and it may include:  Medicines that block allergy symptoms. These may include allergy shots, nasal sprays, and oral antihistamines.  Avoiding the allergen.  Follow these instructions at home:  Avoid the allergen that is causing your symptoms, if possible.  Keep windows closed. If  possible, use air conditioning when pollen counts are high.  Do not use fans in your home.  Do not hang clothes outside to dry.  Wear sunglasses to keep pollen out of your eyes.  Wash your hands right away after you touch household pets.  Take over-the-counter and prescription medicines only as told by your health care provider.  Keep all follow-up visits as told by your health care provider. This is important. Contact a health care provider if:  You have a fever.  You develop a cough that does not go away (is persistent).  You start to wheeze.  Your symptoms do not improve with treatment.  You have thick nasal discharge.  You start to have nosebleeds. Get help right away if:  Your tongue or your lips are swollen.  You have trouble breathing.  You feel light-headed or you feel like you are going to faint.  You have cold sweats. This information is not intended to replace advice given to you by your health care provider. Make sure you discuss any questions you have with your health care provider. Document Released: 03/11/2005 Document Revised: 08/14/2015 Document Reviewed: 09/21/2014 Elsevier Interactive Patient Education  2018 [employer] Nonallergic Rhinitis  1. Nonallergic rhinitis is a term used by allergist to describe inflammation in the nose that is not due to an allergic source (i.e., pollens, mold, animal dander or dust mites). 2. Nonallergic rhinitis can mimic many of the symptoms caused by allergies.  This includes a runny nose ("rhinorrhea"), sneezing, congestion, ear fullness and post nasal drip. 3. These symptoms usually occur year round but can be made worse by many  environmental factors including weather change, irritants such as cigarette smoke, fumes, perfumes, detergents and many others. These are not allergens, but are irritants, and do not cause the formation of antibodies like true allergens.  For this reason most people with nonallergic rhinitis have  negative skin tests. 4. Unfortunately, we have a poor understanding of what causes nonallergic rhinitis but certain factors such as deviated septum, sinusitis and nasal polyps may contribute to the symptoms.  Due to the poor understanding of what causes nonallergic rhinitis it cannot be cured and our treatment is only symptomatic to control symptoms. 5. Important factors in the successful treatment of nonallergic rhinitis include avoidance of the irritants that cause symptoms; for example, people who continue to smoke may never improve.  Continuous therapy works better than intermittent.  This may mean taking medications once daily or 3-4 times a day. Helpful treatments include nasal saline lavage, nasal ipratropium (Atrovent), nasal steroids, and decongestants.  Pure antihistamines don't seem to work very well.  Immunotherapy (allergy shots) has no role in the treatment of nonallergic rhinitis.  Several medications may have to be tried before a good combination is found for you.  These medications, in general, are safe for long term use.  Tolerance may develop to the therapeutic effects of these medications: Frequently changing between several helpful medications can prevent this should it occur.Sinusitis, Adult Sinusitis is soreness and inflammation of your sinuses. Sinuses are hollow spaces in the bones around your face. Your sinuses are located:  Around your eyes.  In the middle of your forehead.  Behind your nose.  In your cheekbones.  Your sinuses and nasal passages are lined with a stringy fluid (mucus). Mucus normally drains out of your sinuses. When your nasal tissues become inflamed or swollen, the mucus can become trapped or blocked so air cannot flow through your sinuses. This allows bacteria, viruses, and funguses to grow, which leads to infection. Sinusitis can develop quickly and last for 7?10 days (acute) or for more than 12 weeks (chronic). Sinusitis often develops after a cold. What  are the causes? This condition is caused by anything that creates swelling in the sinuses or stops mucus from draining, including:  Allergies.  Asthma.  Bacterial or viral infection.  Abnormally shaped bones between the nasal passages.  Nasal growths that contain mucus (nasal polyps).  Narrow sinus openings.  Pollutants, such as chemicals or irritants in the air.  A foreign object stuck in the nose.  A fungal infection. This is rare.  What increases the risk? The following factors may make you more likely to develop this condition:  Having allergies or asthma.  Having had a recent cold or respiratory tract infection.  Having structural deformities or blockages in your nose or sinuses.  Having a weak immune system.  Doing a lot of swimming or diving.  Overusing nasal sprays.  Smoking.  What are the signs or symptoms? The main symptoms of this condition are pain and a feeling of pressure around the affected sinuses. Other symptoms include:  Upper toothache.  Earache.  Headache.  Bad breath.  Decreased sense of smell and taste.  A cough that may get worse at night.  Fatigue.  Fever.  Thick drainage from your nose. The drainage is often green and it may contain pus (purulent).  Stuffy nose or congestion.  Postnasal drip. This is when extra mucus collects in the throat or back of the nose.  Swelling and warmth over the affected sinuses.  Sore throat.  Sensitivity to light.  How is this diagnosed? This condition is diagnosed based on symptoms, a medical history, and a physical exam. To find out if your condition is acute or chronic, your health care provider may:  Look in your nose for signs of nasal polyps.  Tap over the affected sinus to check for signs of infection.  View the inside of your sinuses using an imaging device that has a light attached (endoscope).  If your health care provider suspects that you have chronic sinusitis, you may  also:  Be tested for allergies.  Have a sample of mucus taken from your nose (nasal culture) and checked for bacteria.  Have a mucus sample examined to see if your sinusitis is related to an allergy.  If your sinusitis does not respond to treatment and it lasts longer than 8 weeks, you may have an MRI or CT scan to check your sinuses. These scans also help to determine how severe your infection is. In rare cases, a bone biopsy may be done to rule out more serious types of fungal sinus disease. How is this treated? Treatment for sinusitis depends on the cause and whether your condition is chronic or acute. If a virus is causing your sinusitis, your symptoms will go away on their own within 10 days. You may be given medicines to relieve your symptoms, including:  Topical nasal decongestants. They shrink swollen nasal passages and let mucus drain from your sinuses.  Antihistamines. These drugs block inflammation that is triggered by allergies. This can help to ease swelling in your nose and sinuses.  Topical nasal corticosteroids. These are nasal sprays that ease inflammation and swelling in your nose and sinuses.  Nasal saline washes. These rinses can help to get rid of thick mucus in your nose.  If your condition is caused by bacteria, you will be given an antibiotic medicine. If your condition is caused by a fungus, you will be given an antifungal medicine. Surgery may be needed to correct underlying conditions, such as narrow nasal passages. Surgery may also be needed to remove polyps. Follow these instructions at home: Medicines  Take, use, or apply over-the-counter and prescription medicines only as told by your health care provider. These may include nasal sprays.  If you were prescribed an antibiotic medicine, take it as told by your health care provider. Do not stop taking the antibiotic even if you start to feel better. Hydrate and Humidify  Drink enough water to keep your urine  clear or pale yellow. Staying hydrated will help to thin your mucus.  Use a cool mist humidifier to keep the humidity level in your home above 50%.  Inhale steam for 10-15 minutes, 3-4 times a day or as told by your health care provider. You can do this in the bathroom while a hot shower is running.  Limit your exposure to cool or dry air. Rest  Rest as much as possible.  Sleep with your head raised (elevated).  Make sure to get enough sleep each night. General instructions  Apply a warm, moist washcloth to your face 3-4 times a day or as told by your health care provider. This will help with discomfort.  Wash your hands often with soap and water to reduce your exposure to viruses and other germs. If soap and water are not available, use hand sanitizer.  Do not smoke. Avoid being around people who are smoking (secondhand smoke).  Keep all follow-up visits as told by your health care provider.  This is important. Contact a health care provider if:  You have a fever.  Your symptoms get worse.  Your symptoms do not improve within 10 days. Get help right away if:  You have a severe headache.  You have persistent vomiting.  You have pain or swelling around your face or eyes.  You have vision problems.  You develop confusion.  Your neck is stiff.  You have trouble breathing. This information is not intended to replace advice given to you by your health care provider. Make sure you discuss any questions you have with your health care provider. Document Released: 03/11/2005 Document Revised: 11/05/2015 Document Reviewed: 01/04/2015 Elsevier Interactive Patient Education  2018 [employer] Sinus Rinse What is a sinus rinse? A sinus rinse is a simple home treatment that is used to rinse your sinuses with a sterile mixture of salt and water (saline solution). Sinuses are air-filled spaces in your skull behind the bones of your face and forehead that open into your nasal  cavity. You will use the following:  Saline solution.  Neti pot or spray bottle. This releases the saline solution into your nose and through your sinuses. Neti pots and spray bottles can be purchased at [employer], a health food store, or online.  When would I do a sinus rinse? A sinus rinse can help to clear mucus, dirt, dust, or pollen from the nasal cavity. You may do a sinus rinse when you have a cold, a virus, nasal allergy symptoms, a sinus infection, or stuffiness in the nose or sinuses. If you are considering a sinus rinse:  Ask your child's health care provider before performing a sinus rinse on your child.  Do not do a sinus rinse if you have had ear or nasal surgery, ear infection, or blocked ears.  How do I do a sinus rinse?  Wash your hands.  Disinfect your device according to the directions provided and then dry it.  Use the solution that comes with your device or one that is sold separately in stores. Follow the mixing directions on the package.  Fill your device with the amount of saline solution as directed by the device instructions.  Stand over a sink and tilt your head sideways over the sink.  Place the spout of the device in your upper nostril (the one closer to the ceiling).  Gently pour or squeeze the saline solution into the nasal cavity. The liquid should drain to the lower nostril if you are not overly congested.  Gently blow your nose. Blowing too hard may cause ear pain.  Repeat in the other nostril.  Clean and rinse your device with clean water and then air-dry it. Are there risks of a sinus rinse? Sinus rinse is generally very safe and effective. However, there are a few risks, which include:  A burning sensation in the sinuses. This may happen if you do not make the saline solution as directed. Make sure to follow all directions when making the saline solution.  Infection from contaminated water. This is rare, but possible.  Nasal  irritation.  This information is not intended to replace advice given to you by your health care provider. Make sure you discuss any questions you have with your health care provider. Document Released: 10/06/2013 Document Revised: 02/06/2016 Document Reviewed: 07/27/2013 Elsevier Interactive Patient Education  2017 [employer] Tremor A tremor is trembling or shaking that you cannot control. Most tremors affect the hands or arms. Tremors can also affect the  head, vocal cords, face, and other parts of the body. There are many types of tremors. Common types include:  Essential tremor. These usually occur in people over the age of 60. It may run in families and can happen in otherwise healthy people.  Resting tremor. These occur when the muscles are at rest, such as when your hands are resting in your lap. People with Parkinson disease often have resting tremors.  Postural tremor. These occur when you try to hold a pose, such as keeping your hands outstretched.  Kinetic tremor. These occur during purposeful movement, such as trying to touch a finger to your nose.  Task-specific tremor. These may occur when you perform tasks such as handwriting, speaking, or standing.  Psychogenic tremor. These dramatically lessen or disappear when you are distracted. They can happen in people of all ages.  Some types of tremors have no known cause. Tremors can also be a symptom of nervous system problems (neurological disorders) that may occur with aging. Some tremors go away with treatment while others do not. Follow these instructions at home: Watch your tremor for any changes. The following actions may help to lessen any discomfort you are feeling:  Take medicines only as directed by your health care provider.  Limit alcohol intake to no more than 1 drink per day for nonpregnant women and 2 drinks per day for men. One drink equals 12 oz of beer, 5 oz of wine, or 1 oz of hard liquor.  Do not use any  tobacco products, including cigarettes, chewing tobacco, or electronic cigarettes. If you need help quitting, ask your health care provider.  Avoid extreme heat or cold.  Limit the amount of caffeine you consumeas directed by your health care provider.  Try to get 8 hours of sleep each night.  Find ways to manage your stress, such as meditation or yoga.  Keep all follow-up visits as directed by your health care provider. This is important.  Contact a health care provider if:  You start having a tremor after starting a new medicine.  You have tremor with other symptoms such as: ? Numbness. ? Tingling. ? Pain. ? Weakness.  Your tremor gets worse.  Your tremor interferes with your day-to-day life. This information is not intended to replace advice given to you by your health care provider. Make sure you discuss any questions you have with your health care provider. Document Released: 03/01/2002 Document Revised: 11/12/2015 Document Reviewed: 09/06/2013 Elsevier Interactive Patient Education  [employer].

## 2017-06-24 ENCOUNTER — Telehealth: Payer: Self-pay | Admitting: Physician Assistant

## 2017-06-24 DIAGNOSIS — Z13220 Encounter for screening for lipoid disorders: Secondary | ICD-10-CM

## 2017-06-24 DIAGNOSIS — Z1389 Encounter for screening for other disorder: Secondary | ICD-10-CM

## 2017-06-24 DIAGNOSIS — Z131 Encounter for screening for diabetes mellitus: Secondary | ICD-10-CM

## 2017-06-24 DIAGNOSIS — Z13228 Encounter for screening for other metabolic disorders: Secondary | ICD-10-CM

## 2017-06-24 DIAGNOSIS — E890 Postprocedural hypothyroidism: Secondary | ICD-10-CM

## 2017-06-24 DIAGNOSIS — Z130 Encounter for screening for diseases of the blood and blood-forming organs and certain disorders involving the immune mechanism: Secondary | ICD-10-CM

## 2017-06-24 DIAGNOSIS — E7849 Other hyperlipidemia: Secondary | ICD-10-CM

## 2017-06-24 NOTE — Telephone Encounter (Signed)
Please advise 

## 2017-06-24 NOTE — Telephone Encounter (Signed)
Typically, for CPE: CBC with differential (screening for anemia) CMET (also called CMP) (screening for metabolic disorder) Lipid profile (screening for hyperlipidemia) TSH (hypothyroidism) Urine dip and micro (screening for blood/protein in urine)  Would also do A1C, due to previous elevated glucose

## 2017-06-24 NOTE — Telephone Encounter (Signed)
Copied from CRM [phone]. Topic: Inquiry >> Jun 24, 2017 11:50 AM [name], Kristie S wrote: Pt asking what kind of labs are done for CPE and send her orders because they can do this at her job ([employer]). Please call her to notify.  CPE scheduled 07/08/17

## 2017-06-25 NOTE — Telephone Encounter (Signed)
Orders Placed This Encounter  Procedures  . CBC with Differential    Standing Status:   Future    Standing Expiration Date:   06/26/2018  . Comprehensive metabolic panel    Standing Status:   Future    Standing Expiration Date:   06/26/2018  . Lipid Panel    Standing Status:   Future    Standing Expiration Date:   06/26/2018  . TSH    Standing Status:   Future    Standing Expiration Date:   06/26/2018  . Urine Microscopic    Standing Status:   Future    Standing Expiration Date:   06/26/2018  . Hemoglobin A1c    Standing Status:   Future    Standing Expiration Date:   06/26/2018  . Urinalysis Dipstick    Standing Status:   Future    Standing Expiration Date:   06/26/2018

## 2017-06-25 NOTE — Telephone Encounter (Signed)
Phone call to patient. 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   Patient informed pended lab orders will be sent to provider for signature. Patient advised to call or send MyChart message with information on where to send orders.   Provider, please sign orders if agreeable.

## 2017-06-30 ENCOUNTER — Encounter: Payer: Self-pay | Admitting: Physician Assistant

## 2017-07-03 ENCOUNTER — Other Ambulatory Visit: Payer: Self-pay | Admitting: *Deleted

## 2017-07-03 DIAGNOSIS — Z130 Encounter for screening for diseases of the blood and blood-forming organs and certain disorders involving the immune mechanism: Secondary | ICD-10-CM

## 2017-07-03 DIAGNOSIS — Z131 Encounter for screening for diabetes mellitus: Secondary | ICD-10-CM

## 2017-07-03 DIAGNOSIS — E7849 Other hyperlipidemia: Secondary | ICD-10-CM

## 2017-07-03 DIAGNOSIS — E890 Postprocedural hypothyroidism: Secondary | ICD-10-CM

## 2017-07-03 NOTE — Progress Notes (Signed)
Orders per pcp. Telephone encounter 06/24/17, pt called requesting orders, pcp placed as future standing. Reordered to place under [employer] [facility] acct to avoid charges to pt.  Will collect UA tomorrow. Supplies not available today.

## 2017-07-04 LAB — CBC WITH DIFFERENTIAL/PLATELET
BASOS: 1 %
Basophils Absolute: 0 10*3/uL (ref 0.0–0.2)
EOS (ABSOLUTE): 0.3 10*3/uL (ref 0.0–0.4)
EOS: 4 %
HEMATOCRIT: 40.4 % (ref 34.0–46.6)
Hemoglobin: 13.4 g/dL (ref 11.1–15.9)
Immature Grans (Abs): 0 10*3/uL (ref 0.0–0.1)
Immature Granulocytes: 0 %
LYMPHS ABS: 2 10*3/uL (ref 0.7–3.1)
Lymphs: 30 %
MCH: 30.1 pg (ref 26.6–33.0)
MCHC: 33.2 g/dL (ref 31.5–35.7)
MCV: 91 fL (ref 79–97)
MONOS ABS: 0.6 10*3/uL (ref 0.1–0.9)
Monocytes: 9 %
NEUTROS PCT: 56 %
Neutrophils Absolute: 3.7 10*3/uL (ref 1.4–7.0)
PLATELETS: 269 10*3/uL (ref 150–379)
RBC: 4.45 x10E6/uL (ref 3.77–5.28)
RDW: 13.4 % (ref 12.3–15.4)
WBC: 6.7 10*3/uL (ref 3.4–10.8)

## 2017-07-04 LAB — COMPREHENSIVE METABOLIC PANEL
ALT: 19 IU/L (ref 0–32)
AST: 22 IU/L (ref 0–40)
Albumin/Globulin Ratio: 1.5 (ref 1.2–2.2)
Albumin: 4.3 g/dL (ref 3.6–4.8)
Alkaline Phosphatase: 83 IU/L (ref 39–117)
BILIRUBIN TOTAL: 0.4 mg/dL (ref 0.0–1.2)
BUN/Creatinine Ratio: 19 (ref 12–28)
BUN: 13 mg/dL (ref 8–27)
CALCIUM: 9 mg/dL (ref 8.7–10.3)
CO2: 21 mmol/L (ref 20–29)
Chloride: 95 mmol/L — ABNORMAL LOW (ref 96–106)
Creatinine, Ser: 0.7 mg/dL (ref 0.57–1.00)
GFR calc Af Amer: 108 mL/min/{1.73_m2} (ref >59)
GFR, EST NON AFRICAN AMERICAN: 94 mL/min/{1.73_m2} (ref >59)
GLOBULIN, TOTAL: 2.8 g/dL (ref 1.5–4.5)
Glucose: 86 mg/dL (ref 65–99)
POTASSIUM: 4.3 mmol/L (ref 3.5–5.2)
SODIUM: 134 mmol/L (ref 134–144)
Total Protein: 7.1 g/dL (ref 6.0–8.5)

## 2017-07-04 LAB — TSH: TSH: 1.57 u[IU]/mL (ref 0.450–4.500)

## 2017-07-04 LAB — LIPID PANEL
CHOL/HDL RATIO: 2.8 ratio (ref 0.0–4.4)
Cholesterol, Total: 173 mg/dL (ref 100–199)
HDL: 61 mg/dL (ref >39)
LDL Calculated: 101 mg/dL — ABNORMAL HIGH (ref 0–99)
TRIGLYCERIDES: 57 mg/dL (ref 0–149)
VLDL Cholesterol Cal: 11 mg/dL (ref 5–40)

## 2017-07-04 LAB — HGB A1C W/O EAG: Hgb A1c MFr Bld: 5.8 % — ABNORMAL HIGH (ref 4.8–5.6)

## 2017-07-08 ENCOUNTER — Encounter: Payer: No Typology Code available for payment source | Admitting: Physician Assistant

## 2017-07-15 ENCOUNTER — Ambulatory Visit (INDEPENDENT_AMBULATORY_CARE_PROVIDER_SITE_OTHER): Payer: No Typology Code available for payment source | Admitting: Physician Assistant

## 2017-07-15 ENCOUNTER — Encounter: Payer: Self-pay | Admitting: Physician Assistant

## 2017-07-15 ENCOUNTER — Other Ambulatory Visit: Payer: Self-pay

## 2017-07-15 VITALS — BP 108/80 | HR 97 | Temp 98.9°F | Resp 16 | Ht 64.0 in | Wt 167.0 lb

## 2017-07-15 DIAGNOSIS — E785 Hyperlipidemia, unspecified: Secondary | ICD-10-CM

## 2017-07-15 DIAGNOSIS — Z0000 Encounter for general adult medical examination without abnormal findings: Secondary | ICD-10-CM

## 2017-07-15 DIAGNOSIS — K219 Gastro-esophageal reflux disease without esophagitis: Secondary | ICD-10-CM

## 2017-07-15 DIAGNOSIS — G250 Essential tremor: Secondary | ICD-10-CM

## 2017-07-15 DIAGNOSIS — R7303 Prediabetes: Secondary | ICD-10-CM

## 2017-07-15 DIAGNOSIS — E039 Hypothyroidism, unspecified: Secondary | ICD-10-CM

## 2017-07-15 MED ORDER — OMEPRAZOLE 40 MG PO CPDR: 40.0000 mg | DELAYED_RELEASE_CAPSULE | Freq: Every day | ORAL | 3 refills | Status: AC

## 2017-07-15 MED ORDER — LEVOTHYROXINE SODIUM 100 MCG PO TABS: 150.0000 ug | ORAL_TABLET | Freq: Every day | ORAL | 3 refills | Status: AC

## 2017-07-15 MED ORDER — SIMVASTATIN 40 MG PO TABS: 40.0000 mg | ORAL_TABLET | Freq: Every day | ORAL | 3 refills | Status: AC

## 2017-07-15 NOTE — Progress Notes (Signed)
Patient ID: Victoria Morales, female    DOB: 1955-11-07, 62 y.o.   MRN: [id number]  PCP: Victoria Oar, PA-C  Chief Complaint  Patient presents with  . Annual Exam    Subjective:   Presents for Victoria Morales. Last visit with me was 08-09-16.  Her mother died last week, following a fall at home resulting in C1 fracture. Her father is really struggling, and the patient has been staying with him.  Has had 2 episodes of chest pain, actually in the LEFT shoulder. Not exertional. Associated gas, has had increased belching and flatulence with each episode. No associated SOB, diaphoresis, nausea, vomiting, worsening with exertion.  Intermittent tremor, x 10 years. Worse with coffee, when it's pointed out by others, fine movements (writing). No other neurologic symptoms.   Cervical Cancer Screening: last pap 05/23/2015, negative cytology and HPV. Repeat co-testing in 08/09/20. Breast Cancer Screening: mammogram 12/2015 Colorectal Cancer Screening: colonoscopy 2010-08-10, normal. 10 year recall. Bone Density Testing: not yet HIV Screening: complete 05/23/2015, NEGATIVE STI Screening: low risk, declines Seasonal Influenza Vaccination: 01/14/17 Td/Tdap Vaccination: 05/23/15 Pneumococcal Vaccination: not yet a candidate Zoster Vaccination: not yet Frequency of Dental evaluation: Q6 months Frequency of Eye evaluation: last 2 years ago.  Labs were drawn at her work earlier this month. A1C 5.8% LDL 89 CMET, CBC, TSH all normal    Review of Systems Constitutional: Negative for chills, diaphoresis, fatigue and fever.  HENT: Positive for tinnitus (chronic). Negative for ear discharge, ear pain, postnasal drip, rhinorrhea, sinus pressure, sinus pain, sneezing, sore throat, trouble swallowing and voice change.   Eyes: Negative.   Respiratory: Negative for cough, chest tightness, shortness of breath, wheezing and stridor.   Cardiovascular: Positive for chest pain (sharp, stabbing). Negative  for palpitations and leg swelling.  Gastrointestinal: Negative for abdominal pain, diarrhea, nausea and vomiting.  Endocrine: Negative for cold intolerance, heat intolerance, polydipsia, polyphagia and polyuria.  Genitourinary: Negative.   Musculoskeletal: Negative for arthralgias, back pain, myalgias, neck pain and neck stiffness.  Skin: Negative.   Allergic/Immunologic: Positive for environmental allergies.  Neurological: Positive for tremors (hands, b/l). Negative for dizziness, weakness, light-headedness, numbness and headaches.  Hematological: Negative.   Psychiatric/Behavioral: Negative.    Depression screen [facility] 2/9 07/15/2017 08/07/2016 03/15/2016 03/13/2016 07/14/2015  Decreased Interest 0 0 0 0 0  Down, Depressed, Hopeless 0 0 0 0 0  PHQ - 2 Score 0 0 0 0 0    Patient Active Problem List   Diagnosis Date Noted  . Murmur 06/23/2015  . GERD (gastroesophageal reflux disease) 05/23/2015  . Hyperlipidemia 05/23/2015  . Hypothyroidism 05/23/2015  . Anxiety and depression 05/23/2015  . BMI 28.0-28.9,adult 05/23/2015    Past Medical History:  Diagnosis Date  . Acid reflux   . Depression   . Epidermoid cyst 12/09/2015   Excision planned with Dr. Claud Kelp  . Hyperlipemia   . Hypothyroid      Prior to Admission medications   Medication Sig Start Date End Date Taking? Authorizing Provider  aspirin EC 81 MG tablet Take 81 mg by mouth daily.   Yes [provider]  Calcium Carbonate-Vitamin D 600-200 MG-UNIT CAPS Take 1 tablet by mouth daily.    Yes [provider]  clomiPRAMINE (ANAFRANIL) 50 MG capsule Take 50 mg by mouth at bedtime.   Yes [provider]  levothyroxine (SYNTHROID, LEVOTHROID) 100 MCG tablet Take 1.5 tablets (150 mcg total) by mouth daily before breakfast. 08/07/16  Yes Victoria Oar, PA-C  Misc  Natural Products (GLUCOSAMINE CHONDROITIN VIT D3 PO) Take 2 tablets by mouth daily.   Yes [provider]  montelukast  (SINGULAIR) 10 MG tablet Take 1 tablet (10 mg total) by mouth at bedtime. 06/05/17 06/05/18 Yes Betancourt, Jarold Song, NP  Multiple Vitamin (MULTIVITAMIN WITH MINERALS) TABS tablet Take 1 tablet by mouth daily.   Yes [provider]  omeprazole (PRILOSEC) 40 MG capsule Take 1 capsule (40 mg total) by mouth daily. 08/07/16  Yes Jaana Brodt, PA-C  PARoxetine (PAXIL) 40 MG tablet Take 40 mg by mouth daily.    Yes [provider]  QUEtiapine (SEROQUEL) 300 MG tablet Take 300 mg by mouth at bedtime.   Yes [provider]  simvastatin (ZOCOR) 40 MG tablet Take 1 tablet (40 mg total) by mouth daily. 08/07/16  Yes Nivan Melendrez, PA-C  vitamin C (ASCORBIC ACID) 500 MG tablet Take 500 mg by mouth daily.   Yes [provider]  fluticasone (FLONASE) 50 MCG/ACT nasal spray Place 1 spray into both nostrils 2 (two) times daily. 12/24/16 02/22/17  Betancourt, Jarold Song, NP  sodium chloride (OCEAN) 0.65 % SOLN nasal spray Place 2 sprays into both nostrils every 2 (two) hours while awake. 12/24/16 01/23/17  Betancourt, Jarold Song, NP    Allergies  Allergen Reactions  . Penicillins Rash    Has patient had a PCN reaction causing immediate rash, facial/tongue/throat swelling, SOB or lightheadedness with hypotension: No Has patient had a PCN reaction causing severe rash involving mucus membranes or skin necrosis: No Has patient had a PCN reaction that required hospitalization No Has patient had a PCN reaction occurring within the last 10 years: No If all of the above answers are "NO", then may proceed with Cephalosporin use.     Past Surgical History:  Procedure Laterality Date  . CERVICAL POLYPECTOMY  11/2006  . CHOLECYSTECTOMY  06/2002  . CYST EXCISION  01/05/2016   Posterior neck. Benign epidermoid cyst.   . INNER EAR SURGERY      Family History  Problem Relation Age of Onset  . Diabetes Mother   . Heart failure Father        pacemaker  . Diabetes Sister   . Hyperlipidemia  Sister   . Hypertension Sister   . Cancer Sister 9       breast cancer  . Breast cancer Sister   . Breast cancer Paternal Aunt 53  . Breast cancer Paternal Aunt 6    Social History   Socioeconomic History  . Marital status: Significant Other    Spouse name: Jan  . Number of children: 0  . Years of education: Masters  . Highest education level: Not on file  Occupational History  . Occupation: Geographical information systems officer, pattern organization    Comment: [employer]  Social Needs  . Financial resource strain: Not on file  . Food insecurity:    Worry: Not on file    Inability: Not on file  . Transportation needs:    Medical: Not on file    Non-medical: Not on file  Tobacco Use  . Smoking status: Never Smoker  . Smokeless tobacco: Never Used  Substance and Sexual Activity  . Alcohol use: No    Comment: stopped drinking 2017, "didn't miss it."  . Drug use: No  . Sexual activity: Yes    Partners: Female  Lifestyle  . Physical activity:    Days per week: Not on file    Minutes per session: Not on file  . Stress:  Not on file  Relationships  . Social connections:    Talks on phone: Not on file    Gets together: Not on file    Attends religious service: Not on file    Active member of club or organization: Not on file    Attends meetings of clubs or organizations: Not on file    Relationship status: Not on file  Other Topics Concern  . Not on file  Social History Narrative   Lives with partner, Jan since 69.   Loves work.   When not working, she enjoys taking photographs, especially at the [employer].        Objective:  Physical Exam  Constitutional: She is oriented to person, place, and time. Vital signs are normal. She appears well-developed and well-nourished. She is active and cooperative. No distress.  BP 108/80   Pulse 97   Temp 98.9 F (37.2 C)   Resp 16   Ht 5\' 4"  (1.626 m)   Wt 167 lb (75.8 kg)   SpO2 99%   BMI 28.67 kg/m    HENT:  Head: Normocephalic  and atraumatic.  Right Ear: Hearing, tympanic membrane, external ear and ear canal normal. No foreign bodies.  Left Ear: Hearing, tympanic membrane, external ear and ear canal normal. No foreign bodies.  Nose: Nose normal.  Mouth/Throat: Uvula is midline, oropharynx is clear and moist and mucous membranes are normal. No oral lesions. Normal dentition. No dental abscesses or uvula swelling. No oropharyngeal exudate.  Eyes: Pupils are equal, round, and reactive to light. Conjunctivae, EOM and lids are normal. Right eye exhibits no discharge. Left eye exhibits no discharge. No scleral icterus.  Fundoscopic exam:      The right eye shows no arteriolar narrowing, no AV nicking, no exudate, no hemorrhage and no papilledema. The right eye shows red reflex.       The left eye shows no arteriolar narrowing, no AV nicking, no exudate, no hemorrhage and no papilledema. The left eye shows red reflex.  Neck: Trachea normal, normal range of motion and full passive range of motion without pain. Neck supple. No spinous process tenderness and no muscular tenderness present. No thyroid mass and no thyromegaly present.  Cardiovascular: Normal rate, regular rhythm, normal heart sounds, intact distal pulses and normal pulses.  Pulmonary/Chest: Effort normal and breath sounds normal. Right breast exhibits no inverted nipple, no mass, no nipple discharge, no skin change and no tenderness. Left breast exhibits no inverted nipple, no mass, no nipple discharge, no skin change and no tenderness. Breasts are symmetrical.  Musculoskeletal: She exhibits no edema or tenderness.       Cervical back: Normal.       Thoracic back: Normal.       Lumbar back: Normal.  Lymphadenopathy:       Head (right side): No tonsillar, no preauricular, no posterior auricular and no occipital adenopathy present.       Head (left side): No tonsillar, no preauricular, no posterior auricular and no occipital adenopathy present.    She has no cervical  adenopathy.       Right: No supraclavicular adenopathy present.       Left: No supraclavicular adenopathy present.  Neurological: She is alert and oriented to person, place, and time. She has normal strength and normal reflexes. No cranial nerve deficit. She exhibits normal muscle tone. Coordination and gait normal.  Skin: Skin is warm, dry and intact. No rash noted. She is not diaphoretic. No cyanosis or  erythema. Nails show no clubbing.  Psychiatric: She has a normal mood and affect. Her speech is normal and behavior is normal. Judgment and thought content normal.     Wt Readings from Last 3 Encounters:  07/15/17 167 lb (75.8 kg)  12/09/16 171 lb (77.6 kg)  08/07/16 168 lb 6.4 oz (76.4 kg)     Visual Acuity Screening   Right eye Left eye Both eyes  Without correction:     With correction: 20/13 20/13 20/13          Assessment & Plan:   Problem List Items Addressed This Visit    GERD (gastroesophageal reflux disease)    Controlled with omeprazole 40 mg daily.  Continue.      Relevant Medications   omeprazole (PRILOSEC) 40 MG capsule   Hyperlipidemia    Recent labs drawn at work included a lipid profile.  LDL was 101.  Continue simvastatin 40 mg daily.      Relevant Medications   simvastatin (ZOCOR) 40 MG tablet   Hypothyroidism    Clinically euthyroid.  TSH normal at recent screening.  Continue levothyroxine 100 mcg, 1.5 tablets daily.      Relevant Medications   levothyroxine (SYNTHROID, LEVOTHROID) 100 MCG tablet   Prediabetes    Hemoglobin A1c earlier this month was 5.8%.  Continue healthy lifestyle changes, increased exercise, decreased dietary starches and sugars.      Essential tremor    Stable.  Consider low-dose beta-blocker if worsens or evaluation with neurology.       Other Visit Diagnoses    Annual physical exam    -  Primary   Age-appropriate health guidance provided.       Return in about 1 year (around 07/16/2018) for annual exam.   Fernande Brashelle  S. Ashara Lounsbury, PA-C Primary [facility] at [facility]

## 2017-07-15 NOTE — Progress Notes (Signed)
Subjective:    Patient ID: Victoria Morales, female    DOB: Aug 09, 1955, 62 y.o.   MRN: [id number] Chief Complaint  Patient presents with  . Annual Exam    HPI  62 yo female presents for CPE. Last physical, 04/2015. Patient reports doing well.  Mother died last week at age 70. Patient fell at home, broke her C1 and had a living will where she didn't want to be on respirator. Patient reports she is handling it well and has been staying with her father the past week to assist with him.   Notes a chronic skin lesion on her L temple  Chest/Shoulder pain, L sided, in her shoulder, anterior. Has occurred 2 times, once this week and once last week. Stabbing pain, "real sharp". She states it may be associated with gas, as she had a lot of gas both times, burping and flatulence. Denies association with inspiration, diaphoresis, nausea, vomiting. Denies exertional pain.  Tremor - Hands shake during work, writing, holding various objects. Reports she has noticed it for at least 10 years, has not progressed or gotten better. Worse with coffee, when people notice it. Worse at rest, unassociated with movement. Walking fine, no problems. Denies any problems with motor coordination, memory loss, stiff/rigid movements, dysarthria, loss of balance.  Hyperlipidemia - Reports diet and exercise has been lacking, will improve, per pt.  Prediabetes - A1c 5.8 07/03/2017. Reports she will do better. Quit going to gym due to price. Diet has been poor due to being on the go taking care of mother.  Cervical Cancer Screening: Due 05/22/2020 Breast Cancer Screening: Due 01/14/2018 Colorectal Cancer Screening: Due 07/29/2020 Bone Density Testing: Not yet, 62 yo. HIV Screening: Completed in 05/23/2015 STI Screening:  Seasonal Influenza Vaccination: Due 10/23/2017 Td/Tdap Vaccination: Due 05/22/2025 Pneumococcal Vaccination: Not yet Zoster Vaccination: Not yet. Frequency of Dental evaluation: Q6 months. Frequency of  Eye evaluation: 2 years ago.     Review of Systems  Constitutional: Negative for chills, diaphoresis, fatigue and fever.  HENT: Positive for tinnitus (chronic). Negative for ear discharge, ear pain, postnasal drip, rhinorrhea, sinus pressure, sinus pain, sneezing, sore throat, trouble swallowing and voice change.   Eyes: Negative.   Respiratory: Negative for cough, chest tightness, shortness of breath, wheezing and stridor.   Cardiovascular: Positive for chest pain (sharp, stabbing). Negative for palpitations and leg swelling.  Gastrointestinal: Negative for abdominal pain, diarrhea, nausea and vomiting.  Endocrine: Negative for cold intolerance, heat intolerance, polydipsia, polyphagia and polyuria.  Genitourinary: Negative.   Musculoskeletal: Negative for arthralgias, back pain, myalgias, neck pain and neck stiffness.  Skin: Negative.   Allergic/Immunologic: Positive for environmental allergies.  Neurological: Positive for tremors (hands, b/l). Negative for dizziness, weakness, light-headedness, numbness and headaches.  Hematological: Negative.   Psychiatric/Behavioral: Negative.     Patient Active Problem List   Diagnosis Date Noted  . Murmur 06/23/2015  . GERD (gastroesophageal reflux disease) 05/23/2015  . Hyperlipidemia 05/23/2015  . Hypothyroidism 05/23/2015  . Anxiety and depression 05/23/2015  . BMI 28.0-28.9,adult 05/23/2015    Past Medical History:  Diagnosis Date  . Acid reflux   . Depression   . Epidermoid cyst 12/09/2015   Excision planned with Dr. Claud Kelp  . Hyperlipemia   . Hypothyroid     Prior to Admission medications   Medication Sig Start Date End Date Taking? Authorizing Provider  aspirin EC 81 MG tablet Take 81 mg by mouth daily.   Yes [provider]  Calcium Carbonate-Vitamin D 600-200  MG-UNIT CAPS Take 1 tablet by mouth daily.    Yes [provider]  clomiPRAMINE (ANAFRANIL) 50 MG capsule Take 50 mg by mouth at bedtime.    Yes [provider]  levothyroxine (SYNTHROID, LEVOTHROID) 100 MCG tablet Take 1.5 tablets (150 mcg total) by mouth daily before breakfast. 08/07/16  Yes Jeffery, Chelle, PA-C  Misc Natural Products (GLUCOSAMINE CHONDROITIN VIT D3 PO) Take 2 tablets by mouth daily.   Yes [provider]  montelukast (SINGULAIR) 10 MG tablet Take 1 tablet (10 mg total) by mouth at bedtime. 06/05/17 06/05/18 Yes Betancourt, Jarold Songina A, NP  Multiple Vitamin (MULTIVITAMIN WITH MINERALS) TABS tablet Take 1 tablet by mouth daily.   Yes [provider]  omeprazole (PRILOSEC) 40 MG capsule Take 1 capsule (40 mg total) by mouth daily. 08/07/16  Yes Jeffery, Chelle, PA-C  PARoxetine (PAXIL) 40 MG tablet Take 40 mg by mouth daily.    Yes [provider]  QUEtiapine (SEROQUEL) 300 MG tablet Take 300 mg by mouth at bedtime.   Yes [provider]  simvastatin (ZOCOR) 40 MG tablet Take 1 tablet (40 mg total) by mouth daily. 08/07/16  Yes Jeffery, Chelle, PA-C  vitamin C (ASCORBIC ACID) 500 MG tablet Take 500 mg by mouth daily.   Yes [provider]  fluticasone (FLONASE) 50 MCG/ACT nasal spray Place 1 spray into both nostrils 2 (two) times daily. 12/24/16 02/22/17  Betancourt, Jarold Songina A, NP  sodium chloride (OCEAN) 0.65 % SOLN nasal spray Place 2 sprays into both nostrils every 2 (two) hours while awake. 12/24/16 01/23/17  Betancourt, Jarold Songina A, NP    Allergies  Allergen Reactions  . Penicillins Rash    Has patient had a PCN reaction causing immediate rash, facial/tongue/throat swelling, SOB or lightheadedness with hypotension: No Has patient had a PCN reaction causing severe rash involving mucus membranes or skin necrosis: No Has patient had a PCN reaction that required hospitalization No Has patient had a PCN reaction occurring within the last 10 years: No If all of the above answers are "NO", then may proceed with Cephalosporin use.        Objective:   Physical Exam  Constitutional:  She is oriented to person, place, and time. She appears well-developed and well-nourished. No distress.  BP 108/80   Pulse 97   Temp 98.9 F (37.2 C)   Resp 16   Ht 5\' 4"  (1.626 m)   Wt 167 lb (75.8 kg)   SpO2 99%   BMI 28.67 kg/m    HENT:  Head: Normocephalic and atraumatic.  Nose: Nose normal.  Mouth/Throat: No oropharyngeal exudate.  Eyes: Pupils are equal, round, and reactive to light. Conjunctivae and EOM are normal. Right eye exhibits no discharge. Left eye exhibits no discharge.  Neck: Normal range of motion. Neck supple. No tracheal deviation present. No thyromegaly present.  Cardiovascular: Normal rate, regular rhythm and intact distal pulses. Exam reveals no gallop and no friction rub.  No murmur heard. Pulses:      Radial pulses are 2+ on the right side, and 2+ on the left side.       Dorsalis pedis pulses are 2+ on the right side, and 2+ on the left side.       Posterior tibial pulses are 2+ on the right side, and 2+ on the left side.  Pulmonary/Chest: Effort normal and breath sounds normal. No respiratory distress. She has no wheezes. She has no rales.  Abdominal: Soft. She exhibits no distension and no  mass. There is no tenderness. There is no rebound and no guarding.  Musculoskeletal: Normal range of motion. She exhibits no edema.  Lymphadenopathy:    She has no cervical adenopathy.  Neurological: She is alert and oriented to person, place, and time. She has normal reflexes. She displays normal reflexes. No cranial nerve deficit. Coordination normal.  Skin: Skin is warm and dry. She is not diaphoretic. No erythema.  Psychiatric: She has a normal mood and affect. Her behavior is normal. Judgment and thought content normal.   Labs drawn 07/03/2017 A1c - 5.8 Lipid - LDL 101  Chloride - 95     Assessment & Plan:  1. Annual physical exam Age-appropriate guidance provided.   2. Prediabetes Continue to improve healthy eating and exercise. Will continue to monitor  and make changes as needed.  3. Hypothyroidism, unspecified type Well controlled, continue synthroid. - levothyroxine (SYNTHROID, LEVOTHROID) 100 MCG tablet; Take 1.5 tablets (150 mcg total) by mouth daily before breakfast.  Dispense: 135 tablet; Refill: 3  4. Hyperlipidemia, unspecified hyperlipidemia type Controlled, continue healthy eating and exercise. - simvastatin (ZOCOR) 40 MG tablet; Take 1 tablet (40 mg total) by mouth daily.  Dispense: 90 tablet; Refill: 3  5. Essential tremor Not limiting patients life at this time. No treatment necessary. Normal neuro exam. In the future, may need propranolol if symptoms worsen or cause interference with daily life.  6. Gastroesophageal reflux disease, esophagitis presence not specified Continue omeprazole, well controlled. - omeprazole (PRILOSEC) 40 MG capsule; Take 1 capsule (40 mg total) by mouth daily.  Dispense: 90 capsule; Refill: 3

## 2017-07-15 NOTE — Patient Instructions (Addendum)
IF you received an x-ray today, you will receive an invoice from [facility]. Please contact [facility] at [phone] with questions or concerns regarding your invoice.   IF you received labwork today, you will receive an invoice from [facility]. Please contact [facility] at [phone] with questions or concerns regarding your invoice.   Our billing staff will not be able to assist you with questions regarding bills from these companies.  You will be contacted with the lab results as soon as they are available. The fastest way to get your results is to activate your My Chart account. Instructions are located on the last page of this paperwork. If you have not heard from [place] regarding the results in 2 weeks, please contact this office.      Preventive Care 40-64 Years, Female Preventive care refers to lifestyle choices and visits with your health care provider that can promote health and wellness. What does preventive care include?  A yearly physical exam. This is also called an annual well check.  Dental exams once or twice a year.  Routine eye exams. Ask your health care provider how often you should have your eyes checked.  Personal lifestyle choices, including: ? Daily care of your teeth and gums. ? Regular physical activity. ? Eating a healthy diet. ? Avoiding tobacco and drug use. ? Limiting alcohol use. ? Practicing safe sex. ? Taking low-dose aspirin daily starting at age 34. ? Taking vitamin and mineral supplements as recommended by your health care provider. What happens during an annual well check? The services and screenings done by your health care provider during your annual well check will depend on your age, overall health, lifestyle risk factors, and family history of disease. Counseling Your health care provider may ask you questions about your:  Alcohol use.  Tobacco use.  Drug use.  Emotional well-being.  Home and relationship  well-being.  Sexual activity.  Eating habits.  Work and work Statistician.  Method of birth control.  Menstrual cycle.  Pregnancy history.  Screening You may have the following tests or measurements:  Height, weight, and BMI.  Blood pressure.  Lipid and cholesterol levels. These may be checked every 5 years, or more frequently if you are over 65 years old.  Skin check.  Lung cancer screening. You may have this screening every year starting at age 102 if you have a 30-pack-year history of smoking and currently smoke or have quit within the past 15 years.  Fecal occult blood test (FOBT) of the stool. You may have this test every year starting at age 33.  Flexible sigmoidoscopy or colonoscopy. You may have a sigmoidoscopy every 5 years or a colonoscopy every 10 years starting at age 23.  Hepatitis C blood test.  Hepatitis B blood test.  Sexually transmitted disease (STD) testing.  Diabetes screening. This is done by checking your blood sugar (glucose) after you have not eaten for a while (fasting). You may have this done every 1-3 years.  Mammogram. This may be done every 1-2 years. Talk to your health care provider about when you should start having regular mammograms. This may depend on whether you have a family history of breast cancer.  BRCA-related cancer screening. This may be done if you have a family history of breast, ovarian, tubal, or peritoneal cancers.  Pelvic exam and Pap test. This may be done every 3 years starting at age 3. Starting at age 55, this may be done every 5 years if  you have a Pap test in combination with an HPV test.  Bone density scan. This is done to screen for osteoporosis. You may have this scan if you are at high risk for osteoporosis.  Discuss your test results, treatment options, and if necessary, the need for more tests with your health care provider. Vaccines Your health care provider may recommend certain vaccines, such  as:  Influenza vaccine. This is recommended every year.  Tetanus, diphtheria, and acellular pertussis (Tdap, Td) vaccine. You may need a Td booster every 10 years.  Varicella vaccine. You may need this if you have not been vaccinated.  Zoster vaccine. You may need this after age 77.  Measles, mumps, and rubella (MMR) vaccine. You may need at least one dose of MMR if you were born in 1957 or later. You may also need a second dose.  Pneumococcal 13-valent conjugate (PCV13) vaccine. You may need this if you have certain conditions and were not previously vaccinated.  Pneumococcal polysaccharide (PPSV23) vaccine. You may need one or two doses if you smoke cigarettes or if you have certain conditions.  Meningococcal vaccine. You may need this if you have certain conditions.  Hepatitis A vaccine. You may need this if you have certain conditions or if you travel or work in places where you may be exposed to hepatitis A.  Hepatitis B vaccine. You may need this if you have certain conditions or if you travel or work in places where you may be exposed to hepatitis B.  Haemophilus influenzae type b (Hib) vaccine. You may need this if you have certain conditions.  Talk to your health care provider about which screenings and vaccines you need and how often you need them. This information is not intended to replace advice given to you by your health care provider. Make sure you discuss any questions you have with your health care provider. Document Released: 04/07/2015 Document Revised: 11/29/2015 Document Reviewed: 01/10/2015 Elsevier Interactive Patient Education  [employer].

## 2017-07-17 ENCOUNTER — Encounter: Payer: Self-pay | Admitting: Registered Nurse

## 2017-07-17 ENCOUNTER — Telehealth: Payer: Self-pay | Admitting: Registered Nurse

## 2017-07-17 NOTE — Telephone Encounter (Signed)
Patient notified us her mother died unexpectedly last week.  She took some days off from work and helped her father at the house.  She also needed levothyroxine 150mcg po daily #150 RF3, simvastatin 40mg   Po daily #90 RF3 and omeprazole DR 40mg  po daily #90 RF3 filled.  [facility] and [facility] given medications from [facility].  Discussed with patient grieving can take time and Theodoro KosDoreesa Parker available here at [facility] if she needs someone to talk with besides clinic staff.  Patient verbalized understanding information/instructions, agreed with plan of care and had no further questions at this time.

## 2017-07-20 NOTE — Assessment & Plan Note (Signed)
Stable.  Consider low-dose beta-blocker if worsens or evaluation with neurology.

## 2017-07-20 NOTE — Assessment & Plan Note (Signed)
Clinically euthyroid.  TSH normal at recent screening.  Continue levothyroxine 100 mcg, 1.5 tablets daily.

## 2017-07-20 NOTE — Assessment & Plan Note (Signed)
Controlled with omeprazole 40 mg daily.  Continue.

## 2017-07-20 NOTE — Assessment & Plan Note (Signed)
Hemoglobin A1c earlier this month was 5.8%.  Continue healthy lifestyle changes, increased exercise, decreased dietary starches and sugars.

## 2017-07-20 NOTE — Assessment & Plan Note (Signed)
Recent labs drawn at work included a lipid profile.  LDL was 101.  Continue simvastatin 40 mg daily.

## 2017-08-19 ENCOUNTER — Ambulatory Visit: Payer: Self-pay | Admitting: Registered Nurse

## 2017-08-19 VITALS — BP 125/92 | HR 101 | Temp 98.4°F | Ht 64.0 in | Wt 172.0 lb

## 2017-08-19 DIAGNOSIS — M7989 Other specified soft tissue disorders: Secondary | ICD-10-CM

## 2017-08-19 DIAGNOSIS — J301 Allergic rhinitis due to pollen: Secondary | ICD-10-CM

## 2017-08-19 DIAGNOSIS — E860 Dehydration: Secondary | ICD-10-CM

## 2017-08-19 MED ORDER — FLUTICASONE PROPIONATE 50 MCG/ACT NA SUSP: 1.0000 | Freq: Two times a day (BID) | NASAL | 1 refills | Status: DC

## 2017-08-19 MED ORDER — SALINE SPRAY 0.65 % NA SOLN: 2.0000 | NASAL | 0 refills | Status: DC

## 2017-08-19 NOTE — Progress Notes (Signed)
Subjective:    Patient ID: Victoria Morales, female    DOB: [birth year], 62 y.o.   MRN: [id number]  62 y/o Caucasian established female pt c/o ShOB after walking up a steep hill 2 days ago. States the only time it has occurred is while walking on this particular hill, this time and once before one year ago. Hill is over a quarter mile hike her exercise app logged 13 flights of stairs. Temp was in the 90's at 2pm while she was walking. Reports "heartbeat was in my ears by the end of the hill."  BLE with trace-+1 edema. Pt reports this is normal for her, going on well over a year. "is always like that after I've been standing all day." Denies ShOB since that occurrence 2 days ago. Does not get ShOB while walking a lot, fast at work or carrying 20-25# upstairs at work. Wanting to know if she should be concerned or keep an eye out for anything as her sister had no issue with the walk, and no ShOB. Pt driving in car approx 4.0JW round trip every 1-2 weeks visiting father for past couple of months after mother's passing.  "I did drink a lot of caffeine yesterday; maybe I got dehydrated"  "Had to take tums last night and sleep in recliner due to heartburn also"  "Heartburn resolved with tums"  Denied return of symptoms this am.  Mother required 2 cardiac stents in her lifetime.  Patient stopped her nasal saline and flonase only taking singulair some congestion and post nasal drip.  Some upper teeth pain last week resolved  + sick contacts at work     Review of Systems  Constitutional: Negative for activity change, appetite change, chills, diaphoresis, fatigue, fever and unexpected weight change.  HENT: Positive for congestion, postnasal drip and rhinorrhea. Negative for drooling, ear discharge, ear pain, facial swelling, hearing loss, mouth sores, nosebleeds, sinus pressure, sinus pain, sneezing, sore throat, tinnitus, trouble swallowing and voice change.   Eyes: Negative for photophobia and visual disturbance.   Respiratory: Positive for shortness of breath. Negative for wheezing and stridor.   Cardiovascular: Positive for chest pain and leg swelling. Negative for palpitations.  Gastrointestinal: Negative for abdominal distention, abdominal pain, constipation and diarrhea.  Endocrine: Negative for cold intolerance and heat intolerance.  Genitourinary: Negative for difficulty urinating.  Musculoskeletal: Negative for arthralgias, back pain, gait problem and joint swelling.  Allergic/Immunologic: Positive for environmental allergies. Negative for food allergies.  Neurological: Negative for dizziness, tremors, seizures, syncope, facial asymmetry, speech difficulty, weakness, light-headedness, numbness and headaches.  Psychiatric/Behavioral: Negative for agitation and confusion.       Objective:   Physical Exam  Constitutional: She is oriented to person, place, and time. She appears well-developed and well-nourished. She is active and cooperative.  Non-toxic appearance. She does not have a sickly appearance. She does not appear ill. No distress.  HENT:  Head: Normocephalic and atraumatic.  Right Ear: Hearing, external ear and ear canal normal. A middle ear effusion is present.  Left Ear: Hearing, external ear and ear canal normal. A middle ear effusion is present.  Nose: Mucosal edema and rhinorrhea present. No nose lacerations, sinus tenderness, nasal deformity, septal deviation or nasal septal hematoma. No epistaxis.  No foreign bodies. Right sinus exhibits no maxillary sinus tenderness and no frontal sinus tenderness. Left sinus exhibits no maxillary sinus tenderness and no frontal sinus tenderness.  Mouth/Throat: Uvula is midline and mucous membranes are normal. Mucous membranes are not pale, not  dry and not cyanotic. She does not have dentures. No oral lesions. No trismus in the jaw. Normal dentition. No dental abscesses, uvula swelling, lacerations or dental caries. Posterior oropharyngeal edema and  posterior oropharyngeal erythema present. No oropharyngeal exudate or tonsillar abscesses.  Cobblestoning posterior pharynx; bilateral allergic shiners; bilateral nasal turbinates clear yellow discharge; bilateral TMs air fluid level clear  Eyes: Pupils are equal, round, and reactive to light. Conjunctivae, EOM and lids are normal. Right eye exhibits no chemosis, no discharge, no exudate and no hordeolum. No foreign body present in the right eye. Left eye exhibits no chemosis, no discharge, no exudate and no hordeolum. No foreign body present in the left eye. Right conjunctiva is not injected. Right conjunctiva has no hemorrhage. Left conjunctiva is not injected. Left conjunctiva has no hemorrhage. No scleral icterus. Right eye exhibits normal extraocular motion and no nystagmus. Left eye exhibits normal extraocular motion and no nystagmus. Right pupil is round and reactive. Left pupil is round and reactive. Pupils are equal.  Neck: Trachea normal, normal range of motion and phonation normal. Neck supple. No tracheal tenderness, no spinous process tenderness and no muscular tenderness present. No neck rigidity. No tracheal deviation, no edema, no erythema and normal range of motion present. No thyroid mass and no thyromegaly present.  Cardiovascular: Regular rhythm, S1 normal, S2 normal, normal heart sounds and intact distal pulses. Tachycardia present. PMI is not displaced. Exam reveals no gallop, no distant heart sounds and no friction rub.  No murmur heard. Pulses:      Radial pulses are 2+ on the right side, and 2+ on the left side.  1+/4 bilateral lower extremity edema nonpitting; skin warm dry pink "sock lines equal bilaterally"  Pulmonary/Chest: Effort normal and breath sounds normal. No accessory muscle usage or stridor. No respiratory distress. She has no decreased breath sounds. She has no wheezes. She has no rhonchi. She has no rales. She exhibits no tenderness.  No cough observed in exam room;  spoke full sentences without difficulty  Abdominal: Soft. Normal appearance. She exhibits no distension, no fluid wave and no ascites. There is no rigidity and no guarding.  Musculoskeletal: Normal range of motion. She exhibits no tenderness.       Right shoulder: Normal.       Left shoulder: Normal.       Right elbow: Normal.      Left elbow: Normal.       Right hip: Normal.       Left hip: Normal.       Right knee: Normal.       Left knee: Normal.       Cervical back: Normal.       Thoracic back: Normal.       Lumbar back: Normal.       Right hand: Normal.       Left hand: Normal.       Right lower leg: She exhibits edema. She exhibits no tenderness, no bony tenderness, no swelling, no deformity and no laceration.       Left lower leg: She exhibits edema. She exhibits no tenderness, no bony tenderness, no swelling, no deformity and no laceration.  1+/4 nonpitting lower extremity edema bilaterally nontender to palpation  Lymphadenopathy:       Head (right side): No submental, no submandibular, no tonsillar, no preauricular, no posterior auricular and no occipital adenopathy present.       Head (left side): No submental, no submandibular, no tonsillar, no preauricular,  no posterior auricular and no occipital adenopathy present.    She has no cervical adenopathy.       Right cervical: No superficial cervical, no deep cervical and no posterior cervical adenopathy present.      Left cervical: No superficial cervical, no deep cervical and no posterior cervical adenopathy present.  Neurological: She is alert and oriented to person, place, and time. She has normal strength. She is not disoriented. She displays no atrophy and no tremor. No cranial nerve deficit or sensory deficit. She exhibits normal muscle tone. She displays no seizure activity. Coordination and gait normal. GCS eye subscore is 4. GCS verbal subscore is 5. GCS motor subscore is 6.  In/out of chair; on/off exam table without  difficulty; gait sure and steady in hallway  Skin: Skin is warm, dry and intact. Capillary refill takes less than 2 seconds. No abrasion, no bruising, no burn, no ecchymosis, no laceration, no lesion, no petechiae and no rash noted. She is not diaphoretic. No cyanosis or erythema. No pallor. Nails show no clubbing.  Psychiatric: Her speech is normal and behavior is normal. Judgment and thought content normal. Her mood appears anxious. Her affect is not angry, not blunt, not labile and not inappropriate. She is not actively hallucinating. Cognition and memory are normal. She does not exhibit a depressed mood.  Well groomed She is attentive.  Nursing note and vitals reviewed.         Assessment & Plan:  A-seasonal allergic rhinitis, mild dehydration, bilateral lower extremity swelling  P-discussed with patient mild dehydration and seasonal allergies could compound exertional efforts with heat index near 100 yesterday.  Hydrate today to keep urine pale clear yellow tinged as patient knows she did not drink much water yesterday mostly caffeinated beverages.  If feeling GERD that does not resolve with TUMS/avoiding trigger foods follow up with PCM/UC/ER for EKG.  EKG not available at this [facility].  Patient denied chest pain at time of appt; last episode last night.  Discussed with patient VSS she was anxious/concerned about her symptoms yesterday as "nothing can happen to me; who would take care of dad"  Mother recently died and patient still grieving.  Consider compression stockings if hydrating and elevation of legs when sitting does not improve/resolve lower leg edema.  Discussed uncontrolled allergies can cause airway inflammation that can also contribute to exertional dyspnea e.g. Bronchitis allergic.  Humidity and temperature both high yesterday also.  Patient may use normal saline nasal spray 2 sprays each nostril q2h wa as needed has at home;. flonase 1 spray each nostril BID  #1 RF1 electronic Rx to her pharmacy of choice.  Patient denied personal or family history of ENT cancer.  Consider OTC antihistamine of choice claritin/zyrtec  po daily.  Avoid triggers if possible.  Shower prior to bedtime if exposed to triggers.  If allergic dust/dust mites recommend mattress/pillow covers/encasements; washing linens, vacuuming, sweeping, dusting weekly.  Call or return to clinic as needed if these symptoms worsen or fail to improve as anticipated.   Exitcare handouts on nonspecific chest pain, shortness of breath, adult dehydration, leg swelling, allergic rhinitis and sinus rinse.  Patient verbalized understanding of instructions, agreed with plan of care and had no further questions at this time.   Supportive treatment.   No evidence of invasive bacterial infection, non toxic and well hydrated.  This is most likely self limiting viral infection.  I do not see where any further testing or imaging is necessary  at this time.   I will suggest supportive care, rest, good hygiene and encourage the patient to take adequate fluids.  The patient is to return to clinic or EMERGENCY ROOM if symptoms worsen or change significantly e.g. ear pain, fever, purulent discharge from ears or bleeding.  Exitcare handout on otitis media with effusion given to patient.  Patient verbalized agreement and understanding of treatment plan.   P2:  Avoidance and hand washing.

## 2017-08-19 NOTE — Patient Instructions (Addendum)
Dehydration, Adult Dehydration is a condition in which there is not enough fluid or water in the body. This happens when you lose more fluids than you take in. Important organs, such as the kidneys, brain, and heart, cannot function without a proper amount of fluids. Any loss of fluids from the body can lead to dehydration. Dehydration can range from mild to severe. This condition should be treated right away to prevent it from becoming severe. What are the causes? This condition may be caused by:  Vomiting.  Diarrhea.  Excessive sweating, such as from heat exposure or exercise.  Not drinking enough fluid, especially: ? When ill. ? While doing activity that requires a lot of energy.  Excessive urination.  Fever.  Infection.  Certain medicines, such as medicines that cause the body to lose excess fluid (diuretics).  Inability to access safe drinking water.  Reduced physical ability to get adequate water and food.  What increases the risk? This condition is more likely to develop in people:  Who have a poorly controlled long-term (chronic) illness, such as diabetes, heart disease, or kidney disease.  Who are age 65 or older.  Who are disabled.  Who live in a place with high altitude.  Who play endurance sports.  What are the signs or symptoms? Symptoms of mild dehydration may include:  Thirst.  Dry lips.  Slightly dry mouth.  Dry, warm skin.  Dizziness. Symptoms of moderate dehydration may include:  Very dry mouth.  Muscle cramps.  Dark urine. Urine may be the color of tea.  Decreased urine production.  Decreased tear production.  Heartbeat that is irregular or faster than normal (palpitations).  Headache.  Light-headedness, especially when you stand up from a sitting position.  Fainting (syncope). Symptoms of severe dehydration may include:  Changes in skin, such as: ? Cold and clammy skin. ? Blotchy (mottled) or pale skin. ? Skin that does  not quickly return to normal after being lightly pinched and released (poor skin turgor).  Changes in body fluids, such as: ? Extreme thirst. ? No tear production. ? Inability to sweat when body temperature is high, such as in hot weather. ? Very little urine production.  Changes in vital signs, such as: ? Weak pulse. ? Pulse that is more than 100 beats a minute when sitting still. ? Rapid breathing. ? Low blood pressure.  Other changes, such as: ? Sunken eyes. ? Cold hands and feet. ? Confusion. ? Lack of energy (lethargy). ? Difficulty waking up from sleep. ? Short-term weight loss. ? Unconsciousness. How is this diagnosed? This condition is diagnosed based on your symptoms and a physical exam. Blood and urine tests may be done to help confirm the diagnosis. How is this treated? Treatment for this condition depends on the severity. Mild or moderate dehydration can often be treated at home. Treatment should be started right away. Do not wait until dehydration becomes severe. Severe dehydration is an emergency and it needs to be treated in a hospital. Treatment for mild dehydration may include:  Drinking more fluids.  Replacing salts and minerals in your blood (electrolytes) that you may have lost. Treatment for moderate dehydration may include:  Drinking an oral rehydration solution (ORS). This is a drink that helps you replace fluids and electrolytes (rehydrate). It can be found at pharmacies and retail stores. Treatment for severe dehydration may include:  Receiving fluids through an IV tube.  Receiving an electrolyte solution through a feeding tube that is passed through your nose   and into your stomach (nasogastric tube, or NG tube).  Correcting any abnormalities in electrolytes.  Treating the underlying cause of dehydration. Follow these instructions at home:  If directed by your health care provider, drink an ORS: ? Make an ORS by following instructions on the  package. ? Start by drinking small amounts, about  cup (120 mL) every 5-10 minutes. ? Slowly increase how much you drink until you have taken the amount recommended by your health care provider.  Drink enough clear fluid to keep your urine clear or pale yellow. If you were told to drink an ORS, finish the ORS first, then start slowly drinking other clear fluids. Drink fluids such as: ? Water. Do not drink only water. Doing that can lead to having too little salt (sodium) in the body (hyponatremia). ? Ice chips. ? Fruit juice that you have added water to (diluted fruit juice). ? Low-calorie sports drinks.  Avoid: ? Alcohol. ? Drinks that contain a lot of sugar. These include high-calorie sports drinks, fruit juice that is not diluted, and soda. ? Caffeine. ? Foods that are greasy or contain a lot of fat or sugar.  Take over-the-counter and prescription medicines only as told by your health care provider.  Do not take sodium tablets. This can lead to having too much sodium in the body (hypernatremia).  Eat foods that contain a healthy balance of electrolytes, such as bananas, oranges, potatoes, tomatoes, and spinach.  Keep all follow-up visits as told by your health care provider. This is important. Contact a health care provider if:  You have abdominal pain that: ? Gets worse. ? Stays in one area (localizes).  You have a rash.  You have a stiff neck.  You are more irritable than usual.  You are sleepier or more difficult to wake up than usual.  You feel weak or dizzy.  You feel very thirsty.  You have urinated only a small amount of very dark urine over 6-8 hours. Get help right away if:  You have symptoms of severe dehydration.  You cannot drink fluids without vomiting.  Your symptoms get worse with treatment.  You have a fever.  You have a severe headache.  You have vomiting or diarrhea that: ? Gets worse. ? Does not go away.  You have blood or green matter  (bile) in your vomit.  You have blood in your stool. This may cause stool to look black and tarry.  You have not urinated in 6-8 hours.  You faint.  Your heart rate while sitting still is over 100 beats a minute.  You have trouble breathing. This information is not intended to replace advice given to you by your health care provider. Make sure you discuss any questions you have with your health care provider. Document Released: 03/11/2005 Document Revised: 10/06/2015 Document Reviewed: 05/05/2015 Elsevier Interactive Patient Education  2018 [employer] Sinus Rinse What is a sinus rinse? A sinus rinse is a simple home treatment that is used to rinse your sinuses with a sterile mixture of salt and water (saline solution). Sinuses are air-filled spaces in your skull behind the bones of your face and forehead that open into your nasal cavity. You will use the following:  Saline solution.  Neti pot or spray bottle. This releases the saline solution into your nose and through your sinuses. Neti pots and spray bottles can be purchased at [employer], a health food store, or online.  When would I do a sinus rinse?  A sinus rinse can help to clear mucus, dirt, dust, or pollen from the nasal cavity. You may do a sinus rinse when you have a cold, a virus, nasal allergy symptoms, a sinus infection, or stuffiness in the nose or sinuses. If you are considering a sinus rinse:  Ask your child's health care provider before performing a sinus rinse on your child.  Do not do a sinus rinse if you have had ear or nasal surgery, ear infection, or blocked ears.  How do I do a sinus rinse?  Wash your hands.  Disinfect your device according to the directions provided and then dry it.  Use the solution that comes with your device or one that is sold separately in stores. Follow the mixing directions on the package.  Fill your device with the amount of saline solution as directed by the device  instructions.  Stand over a sink and tilt your head sideways over the sink.  Place the spout of the device in your upper nostril (the one closer to the ceiling).  Gently pour or squeeze the saline solution into the nasal cavity. The liquid should drain to the lower nostril if you are not overly congested.  Gently blow your nose. Blowing too hard may cause ear pain.  Repeat in the other nostril.  Clean and rinse your device with clean water and then air-dry it. Are there risks of a sinus rinse? Sinus rinse is generally very safe and effective. However, there are a few risks, which include:  A burning sensation in the sinuses. This may happen if you do not make the saline solution as directed. Make sure to follow all directions when making the saline solution.  Infection from contaminated water. This is rare, but possible.  Nasal irritation.  This information is not intended to replace advice given to you by your health care provider. Make sure you discuss any questions you have with your health care provider. Document Released: 10/06/2013 Document Revised: 02/06/2016 Document Reviewed: 07/27/2013 Elsevier Interactive Patient Education  2017 [employer] Allergic Rhinitis, Adult Allergic rhinitis is an allergic reaction that affects the mucous membrane inside the nose. It causes sneezing, a runny or stuffy nose, and the feeling of mucus going down the back of the throat (postnasal drip). Allergic rhinitis can be mild to severe. There are two types of allergic rhinitis:  Seasonal. This type is also called hay fever. It happens only during certain seasons.  Perennial. This type can happen at any time of the year.  What are the causes? This condition happens when the body's defense system (immune system) responds to certain harmless substances called allergens as though they were germs.  Seasonal allergic rhinitis is triggered by pollen, which can come from grasses, trees, and weeds.  Perennial allergic rhinitis may be caused by:  House dust mites.  Pet dander.  Mold spores.  What are the signs or symptoms? Symptoms of this condition include:  Sneezing.  Runny or stuffy nose (nasal congestion).  Postnasal drip.  Itchy nose.  Tearing of the eyes.  Trouble sleeping.  Daytime sleepiness.  How is this diagnosed? This condition may be diagnosed based on:  Your medical history.  A physical exam.  Tests to check for related conditions, such as: ? Asthma. ? Pink eye. ? Ear infection. ? Upper respiratory infection.  Tests to find out which allergens trigger your symptoms. These may include skin or blood tests.  How is this treated? There is no cure for this condition,  but treatment can help control symptoms. Treatment may include:  Taking medicines that block allergy symptoms, such as antihistamines. Medicine may be given as a shot, nasal spray, or pill.  Avoiding the allergen.  Desensitization. This treatment involves getting ongoing shots until your body becomes less sensitive to the allergen. This treatment may be done if other treatments do not help.  If taking medicine and avoiding the allergen does not work, new, stronger medicines may be prescribed.  Follow these instructions at home:  Find out what you are allergic to. Common allergens include smoke, dust, and pollen.  Avoid the things you are allergic to. These are some things you can do to help avoid allergens: ? Replace carpet with wood, tile, or vinyl flooring. Carpet can trap dander and dust. ? Do not smoke. Do not allow smoking in your home. ? Change your heating and air conditioning filter at least once a month. ? During allergy season:  Keep windows closed as much as possible.  Plan outdoor activities when pollen counts are lowest. This is usually during the evening hours.  When coming indoors, change clothing and shower before sitting on furniture or bedding.  Take  over-the-counter and prescription medicines only as told by your health care provider.  Keep all follow-up visits as told by your health care provider. This is important. Contact a health care provider if:  You have a fever.  You develop a persistent cough.  You make whistling sounds when you breathe (you wheeze).  Your symptoms interfere with your normal daily activities. Get help right away if:  You have shortness of breath. Summary  This condition can be managed by taking medicines as directed and avoiding allergens.  Contact your health care provider if you develop a persistent cough or fever.  During allergy season, keep windows closed as much as possible. This information is not intended to replace advice given to you by your health care provider. Make sure you discuss any questions you have with your health care provider. Document Released: 12/04/2000 Document Revised: 04/18/2016 Document Reviewed: 04/18/2016 Elsevier Interactive Patient Education  2018 [employer] Nonspecific Chest Pain Chest pain can be caused by many different conditions. There is always a chance that your pain could be related to something serious, such as a heart attack or a blood clot in your lungs. Chest pain can also be caused by conditions that are not life-threatening. If you have chest pain, it is very important to follow up with your health care provider. What are the causes? Causes of this condition include:  Heartburn.  Pneumonia or bronchitis.  Anxiety or stress.  Inflammation around your heart (pericarditis) or lung (pleuritis or pleurisy).  A blood clot in your lung.  A collapsed lung (pneumothorax). This can develop suddenly on its own (spontaneous pneumothorax) or from trauma to the chest.  Shingles infection (varicella-zoster virus).  Heart attack.  Damage to the bones, muscles, and cartilage that make up your chest wall. This can include: ? Bruised bones due to  injury. ? Strained muscles or cartilage due to frequent or repeated coughing or overwork. ? Fracture to one or more ribs. ? Sore cartilage due to inflammation (costochondritis).  What increases the risk? Risk factors for this condition may include:  Activities that increase your risk for trauma or injury to your chest.  Respiratory infections or conditions that cause frequent coughing.  Medical conditions or overeating that can cause heartburn.  Heart disease or family history of heart disease.  Conditions  or health behaviors that increase your risk of developing a blood clot.  Having had chicken pox (varicella zoster).  What are the signs or symptoms? Chest pain can feel like:  Burning or tingling on the surface of your chest or deep in your chest.  Crushing, pressure, aching, or squeezing pain.  Dull or sharp pain that is worse when you move, cough, or take a deep breath.  Pain that is also felt in your back, neck, shoulder, or arm, or pain that spreads to any of these areas.  Your chest pain may come and go, or it may stay constant. How is this diagnosed? Lab tests or other studies may be needed to find the cause of your pain. Your health care provider may have you take a test called an ECG (electrocardiogram). An ECG records your heartbeat patterns at the time the test is performed. You may also have other tests, such as:  Transthoracic echocardiogram (TTE). In this test, sound waves are used to create a picture of the heart structures and to look at how blood flows through your heart.  Transesophageal echocardiogram (TEE).This is a more advanced imaging test that takes images from inside your body. It allows your health care provider to see your heart in finer detail.  Cardiac monitoring. This allows your health care provider to monitor your heart rate and rhythm in real time.  Holter monitor. This is a portable device that records your heartbeat and can help to  diagnose abnormal heartbeats. It allows your health care provider to track your heart activity for several days, if needed.  Stress tests. These can be done through exercise or by taking medicine that makes your heart beat more quickly.  Blood tests.  Other imaging tests.  How is this treated? Treatment depends on what is causing your chest pain. Treatment may include:  Medicines. These may include: ? Acid blockers for heartburn. ? Anti-inflammatory medicine. ? Pain medicine for inflammatory conditions. ? Antibiotic medicine, if an infection is present. ? Medicines to dissolve blood clots. ? Medicines to treat coronary artery disease (CAD).  Supportive care for conditions that do not require medicines. This may include: ? Resting. ? Applying heat or cold packs to injured areas. ? Limiting activities until pain decreases.  Follow these instructions at home: Medicines  If you were prescribed an antibiotic, take it as told by your health care provider. Do not stop taking the antibiotic even if you start to feel better.  Take over-the-counter and prescription medicines only as told by your health care provider. Lifestyle  Do not use any products that contain nicotine or tobacco, such as cigarettes and e-cigarettes. If you need help quitting, ask your health care provider.  Do not drink alcohol.  Make lifestyle changes as directed by your health care provider. These may include: ? Getting regular exercise. Ask your health care provider to suggest some activities that are safe for you. ? Eating a heart-healthy diet. A registered dietitian can help you to learn healthy eating options. ? Maintaining a healthy weight. ? Managing diabetes, if necessary. ? Reducing stress, such as with yoga or relaxation techniques. General instructions  Avoid any activities that bring on chest pain.  If heartburn is the cause for your chest pain, raise (elevate) the head of your bed about 6 inches  (15 cm) by putting blocks under the legs. Sleeping with more pillows does not effectively relieve heartburn because it only changes the position of your head.  Keep all follow-up  visits as told by your health care provider. This is important. This includes any further testing if your chest pain does not go away. Contact a health care provider if:  Your chest pain does not go away.  You have a rash with blisters on your chest.  You have a fever.  You have chills. Get help right away if:  Your chest pain is worse.  You have a cough that gets worse, or you cough up blood.  You have severe pain in your abdomen.  You have severe weakness.  You faint.  You have sudden, unexplained chest discomfort.  You have sudden, unexplained discomfort in your arms, back, neck, or jaw.  You have shortness of breath at any time.  You suddenly start to sweat, or your skin gets clammy.  You feel nauseous or you vomit.  You suddenly feel light-headed or dizzy.  Your heart begins to beat quickly, or it feels like it is skipping beats. These symptoms may represent a serious problem that is an emergency. Do not wait to see if the symptoms will go away. Get medical help right away. Call your local emergency services (911 in the [place] Do not drive yourself to the hospital. This information is not intended to replace advice given to you by your health care provider. Make sure you discuss any questions you have with your health care provider. Document Released: 12/19/2004 Document Revised: 12/04/2015 Document Reviewed: 12/04/2015 Elsevier Interactive Patient Education  2017 [employer]. Shortness of Breath, Adult Shortness of breath is when a person has trouble breathing enough air, or when a person feels like she or he is having trouble breathing in enough air. Shortness of breath could be a sign of medical problem. Follow these instructions at home: Pay attention to any changes in your symptoms.  Take these actions to help with your condition:  Do not smoke. Smoking is a common cause of shortness of breath. If you smoke and you need help quitting, ask your health care provider.  Avoid things that can irritate your airways, such as: ? Mold. ? Dust. ? Air pollution. ? Chemical fumes. ? Things that can cause allergy symptoms (allergens), if you have allergies.  Keep your living space clean and free of mold and dust.  Rest as needed. Slowly return to your usual activities.  Take over-the-counter and prescription medicines, including oxygen and inhaled medicines, only as told by your health care provider.  Keep all follow-up visits as told by your health care provider. This is important.  Contact a health care provider if:  Your condition does not improve as soon as expected.  You have a hard time doing your normal activities, even after you rest.  You have new symptoms. Get help right away if:  Your shortness of breath gets worse.  You have shortness of breath when you are resting.  You feel light-headed or you faint.  You have a cough that is not controlled with medicines.  You cough up blood.  You have pain with breathing.  You have pain in your chest, arms, shoulders, or abdomen.  You have a fever.  You cannot walk up stairs or exercise the way that you normally do. This information is not intended to replace advice given to you by your health care provider. Make sure you discuss any questions you have with your health care provider. Document Released: 12/04/2000 Document Revised: 09/30/2015 Document Reviewed: 08/17/2015 Elsevier Interactive Patient Education  2018 [employer].  How to Use  Compression Stockings Compression stockings are elastic socks that squeeze the legs. They help to increase blood flow to the legs, decrease swelling in the legs, and reduce the chance of developing blood clots in the lower legs. Compression stockings are often used by  people who:  Are recovering from surgery.  Have poor circulation in their legs.  Are prone to getting blood clots in their legs.  Have varicose veins.  Sit or stay in bed for long periods of time.  How to use compression stockings Before you put on your compression stockings:  Make sure that they are the correct size. If you do not know your size, ask your health care provider.  Make sure that they are clean, dry, and in good condition.  Check them for rips and tears. Do not put them on if they are ripped or torn.  Put your stockings on first thing in the morning, before you get out of bed. Keep them on for as long as your health care provider advises. When you are wearing your stockings:  Keep them as smooth as possible. Do not allow them to bunch up. It is especially important to prevent the stockings from bunching up around your toes or behind your knees.  Do not roll the stockings downward and leave them rolled down. This can decrease blood flow to your leg.  Change them right away if they become wet or dirty.  When you take off your stockings, inspect your legs and feet. Anything that does not seem normal may require medical attention. Look for:  Open sores.  Red spots.  Swelling.  Information and tips  Do not stop wearing your compression stockings without talking to your health care provider first.  Wash your stockings every day with mild detergent in cold or warm water. Do not use bleach. Air-dry your stockings or dry them in a clothes dryer on low heat.  Replace your stockings every 3-6 months.  If skin moisturizing is part of your treatment plan, apply lotion or cream at night so that your skin will be dry when you put on the stockings in the morning. It is harder to put the stockings on when you have lotion on your legs or feet. Contact a health care provider if: Remove your stockings and seek medical care if:  You have a feeling of pins and needles in your  feet or legs.  You have any new changes in your skin.  You have skin lesions that are getting worse.  You have swelling or pain that is getting worse.  Get help right away if:  You have numbness or tingling in your lower legs that does not get better right after you take the stockings off.  Your toes or feet become cold and blue.  You develop open sores or red spots on your legs that do not go away.  You see or feel a warm spot on your leg.  You have new swelling or soreness in your leg.  You are short of breath or you have chest pain for no reason.  You have a rapid or irregular heartbeat.  You feel light-headed or dizzy. This information is not intended to replace advice given to you by your health care provider. Make sure you discuss any questions you have with your health care provider. Document Released: 01/06/2009 Document Revised: 08/09/2015 Document Reviewed: 02/16/2014 Elsevier Interactive Patient Education  [employer].

## 2017-09-30 ENCOUNTER — Encounter: Payer: Self-pay | Admitting: Registered Nurse

## 2017-09-30 ENCOUNTER — Ambulatory Visit: Payer: Self-pay | Admitting: Registered Nurse

## 2017-09-30 VITALS — BP 126/95 | HR 93 | Temp 98.3°F

## 2017-09-30 DIAGNOSIS — J0101 Acute recurrent maxillary sinusitis: Secondary | ICD-10-CM

## 2017-09-30 MED ORDER — CEPHALEXIN 500 MG PO CAPS: 500.0000 mg | ORAL_CAPSULE | Freq: Two times a day (BID) | ORAL | 0 refills | Status: AC

## 2017-09-30 NOTE — Progress Notes (Signed)
Subjective:    Patient ID: Victoria Morales, female    DOB: 11/01/1955, 62 y.o.   MRN: [id number]  62y/o Caucasian established female pt c/o frontal and maxillary sinus pain x4-5 days. Pressure behind eyes, dental pain.  Nasal congestion and rhinorrhea. Some chest congestion that has improved since the weekend. Still with mildly productive cough present. Had fever 100.3 over the weekend. Was able to treat that effectively with OTC antipyretic and has not recurred since. Used multi sx cold/sinus relief OTC last night. Also using Flonase and Singulair. Also would like a skin lesion at L temporal area of head checked. Has been present for at least 10 years, but has noticed it changing over the past week. Has never been raised before, but hit it with a comb or brush last week and realized it is now raised. Also sts it appears darker in color. Denies shape or size changing any.  Bled for a little while also.  Patient thinks currently scabbed over.  Nasal saline and flonase making her frontal sinuses burn with use; taking singulair but no OTC antihistamine     Review of Systems  Constitutional: Positive for fever. Negative for activity change, appetite change, chills, diaphoresis and fatigue.  HENT: Positive for congestion, postnasal drip, sinus pressure and sinus pain. Negative for dental problem, drooling, ear discharge, ear pain, facial swelling, hearing loss, mouth sores, nosebleeds, sore throat, trouble swallowing and voice change.   Eyes: Negative for photophobia and visual disturbance.  Respiratory: Positive for cough. Negative for choking, shortness of breath, wheezing and stridor.   Cardiovascular: Negative for chest pain.  Gastrointestinal: Negative for abdominal distention, abdominal pain, diarrhea, nausea and vomiting.  Endocrine: Negative for cold intolerance and heat intolerance.  Genitourinary: Negative for difficulty urinating.  Musculoskeletal: Negative for neck pain and neck  stiffness.  Skin: Positive for color change, rash and wound. Negative for pallor.  Allergic/Immunologic: Positive for environmental allergies. Negative for food allergies.  Neurological: Positive for headaches. Negative for dizziness, tremors, seizures, syncope, facial asymmetry, speech difficulty, weakness, light-headedness and numbness.  Hematological: Negative for adenopathy. Does not bruise/bleed easily.  Psychiatric/Behavioral: Negative for agitation, confusion and sleep disturbance.       Objective:   Physical Exam  Constitutional: She is oriented to person, place, and time. She appears well-developed and well-nourished. She is active and cooperative.  Non-toxic appearance. She does not have a sickly appearance. She appears ill. No distress.  HENT:  Head: Normocephalic and atraumatic.  Right Ear: Hearing, external ear and ear canal normal. A middle ear effusion is present.  Left Ear: Hearing, external ear and ear canal normal. A middle ear effusion is present.  Nose: Mucosal edema and rhinorrhea present. No nose lacerations, sinus tenderness, nasal deformity, septal deviation or nasal septal hematoma. No epistaxis.  No foreign bodies. Right sinus exhibits maxillary sinus tenderness and frontal sinus tenderness. Left sinus exhibits maxillary sinus tenderness and frontal sinus tenderness.  Mouth/Throat: Uvula is midline and mucous membranes are normal. Mucous membranes are not pale, not dry and not cyanotic. She does not have dentures. No oral lesions. No trismus in the jaw. Normal dentition. No dental abscesses, uvula swelling, lacerations or dental caries. Posterior oropharyngeal edema and posterior oropharyngeal erythema present. No oropharyngeal exudate or tonsillar abscesses. No tonsillar exudate.  Maxillary greater than frontal sinuses TTP; bilateral allergic shiners; cobblestoning posterior pharynx; bilateral TMs air fluid level clear  Eyes: Pupils are equal, round, and reactive to  light. Conjunctivae, EOM and lids are normal.  Right eye exhibits no chemosis, no discharge, no exudate and no hordeolum. No foreign body present in the right eye. Left eye exhibits no chemosis, no discharge, no exudate and no hordeolum. No foreign body present in the left eye. Right conjunctiva is not injected. Right conjunctiva has no hemorrhage. Left conjunctiva is not injected. Left conjunctiva has no hemorrhage. No scleral icterus. Right eye exhibits normal extraocular motion and no nystagmus. Left eye exhibits normal extraocular motion and no nystagmus. Right pupil is round and reactive. Left pupil is round and reactive. Pupils are equal.  Neck: Trachea normal, normal range of motion and phonation normal. Neck supple. No tracheal tenderness, no spinous process tenderness and no muscular tenderness present. No neck rigidity. No tracheal deviation, no edema, no erythema and normal range of motion present. No thyroid mass and no thyromegaly present.  Cardiovascular: Normal rate, regular rhythm, S1 normal, S2 normal, normal heart sounds and intact distal pulses. PMI is not displaced. Exam reveals no gallop and no friction rub.  No murmur heard. Pulmonary/Chest: Effort normal and breath sounds normal. No accessory muscle usage or stridor. No respiratory distress. She has no decreased breath sounds. She has no wheezes. She has no rhonchi. She has no rales. She exhibits no tenderness.  Frequent nonproductive cough in exam room ;spoke full sentences without difficulty  Abdominal: Soft. Normal appearance. She exhibits no distension and no fluid wave. There is no rigidity and no guarding.  Musculoskeletal: She exhibits no edema or tenderness.       Right shoulder: Normal.       Left shoulder: Normal.       Right elbow: Normal.      Left elbow: Normal.       Right hip: Normal.       Left hip: Normal.       Right knee: Normal.       Left knee: Normal.       Cervical back: Normal.       Thoracic back:  Normal.       Lumbar back: Normal.       Right hand: Normal.       Left hand: Normal.  Lymphadenopathy:       Head (right side): No submental, no submandibular, no tonsillar, no preauricular, no posterior auricular and no occipital adenopathy present.       Head (left side): No submental, no submandibular, no tonsillar, no preauricular, no posterior auricular and no occipital adenopathy present.    She has no cervical adenopathy.       Right cervical: No superficial cervical, no deep cervical and no posterior cervical adenopathy present.      Left cervical: No superficial cervical, no deep cervical and no posterior cervical adenopathy present.  Neurological: She is alert and oriented to person, place, and time. She has normal strength. She is not disoriented. She displays no atrophy and no tremor. No cranial nerve deficit or sensory deficit. She exhibits normal muscle tone. She displays no seizure activity. Coordination and gait normal. GCS eye subscore is 4. GCS verbal subscore is 5. GCS motor subscore is 6.  In/out of chair; on/off exam table without difficulty; gait sure and steady in hallway  Skin: Skin is warm, dry and intact. Capillary refill takes less than 2 seconds. Lesion and rash noted. No abrasion, no bruising, no burn, no ecchymosis, no laceration and no petechiae noted. Rash is macular. She is not diaphoretic. No cyanosis or erythema. No pallor. Nails show no clubbing.  Irregular border 1cm diameter lesion left temple nontender tan  Psychiatric: She has a normal mood and affect. Her speech is normal and behavior is normal. Judgment and thought content normal. She is not actively hallucinating. Cognition and memory are normal. She is attentive.  Nursing note and vitals reviewed.         Assessment & Plan:  A-recurrent maxillary sinusitis   P-continue singulair 10mg  po qhs, flonase 1 spray each nostril BID, saline 2 sprays each nostril q2h wa prn congestion. Start keflex  500mg  po BID x 10days #30 RF0 dispensed from [facility] Denied personal or family history of ENT cancer.  Shower BID especially prior to bed. No evidence of systemic bacterial infection, non toxic and well hydrated.  I do not see where any further testing or imaging is necessary at this time.   I will suggest supportive care, rest, good hygiene and encourage the patient to take adequate fluids.  The patient is to return to clinic or EMERGENCY ROOM if symptoms worsen or change significantly.  Exitcare handout on sinusitis and sinus rinse given to patient.  Patient verbalized agreement and understanding of treatment plan and had no further questions at this time.   P2:  Hand washing and cover cough  Patient may use normal saline nasal spray 2 sprays each nostril q2h wa as needed. flonase 50mcg 1 spray each nostril BID.  Patient denied personal or family history of ENT cancer.  OTC antihistamine of choice claritin/zyrtec 10mg  po daily.  Avoid triggers if possible.  Shower prior to bedtime if exposed to triggers.  If allergic dust/dust mites recommend mattress/pillow covers/encasements; washing linens, vacuuming, sweeping, dusting weekly.  Call or return to clinic as needed if these symptoms worsen or fail to improve as anticipated.   Exitcare handout on allergic rhinitis and sinus rinse given to patient.  Patient verbalized understanding of instructions, agreed with plan of care and had no further questions at this time.  P2:  Avoidance and hand washing.  [facility] lesion ensure ask providers office if they perform skin biopsies or if referral to dermatology required.  Discussed Asymmetrical, Border irregular, Changing color, Diameter enlarging especially greater than 1cm, Excoriation/Evolving lesion when performing self-skin exams. Continue self-skin exams on regular basis, wear protective clothing and sunscreen at least 30 SPF.  Patient verbalized understanding of  instructions, agreed with plan of care and had no further questions at this time.

## 2017-09-30 NOTE — Patient Instructions (Signed)
Sinus Rinse What is a sinus rinse? A sinus rinse is a simple home treatment that is used to rinse your sinuses with a sterile mixture of salt and water (saline solution). Sinuses are air-filled spaces in your skull behind the bones of your face and forehead that open into your nasal cavity. You will use the following:  Saline solution.  Neti pot or spray bottle. This releases the saline solution into your nose and through your sinuses. Neti pots and spray bottles can be purchased at your local pharmacy, a health food store, or online.  When would I do a sinus rinse? A sinus rinse can help to clear mucus, dirt, dust, or pollen from the nasal cavity. You may do a sinus rinse when you have a cold, a virus, nasal allergy symptoms, a sinus infection, or stuffiness in the nose or sinuses. If you are considering a sinus rinse:  Ask your child's health care provider before performing a sinus rinse on your child.  Do not do a sinus rinse if you have had ear or nasal surgery, ear infection, or blocked ears.  How do I do a sinus rinse?  Wash your hands.  Disinfect your device according to the directions provided and then dry it.  Use the solution that comes with your device or one that is sold separately in stores. Follow the mixing directions on the package.  Fill your device with the amount of saline solution as directed by the device instructions.  Stand over a sink and tilt your head sideways over the sink.  Place the spout of the device in your upper nostril (the one closer to the ceiling).  Gently pour or squeeze the saline solution into the nasal cavity. The liquid should drain to the lower nostril if you are not overly congested.  Gently blow your nose. Blowing too hard may cause ear pain.  Repeat in the other nostril.  Clean and rinse your device with clean water and then air-dry it. Are there risks of a sinus rinse? Sinus rinse is generally very safe and effective. However,  there are a few risks, which include:  A burning sensation in the sinuses. This may happen if you do not make the saline solution as directed. Make sure to follow all directions when making the saline solution.  Infection from contaminated water. This is rare, but possible.  Nasal irritation.  This information is not intended to replace advice given to you by your health care provider. Make sure you discuss any questions you have with your health care provider. Document Released: 10/06/2013 Document Revised: 02/06/2016 Document Reviewed: 07/27/2013 Elsevier Interactive Patient Education  2017 [employer] Sinusitis, Adult Sinusitis is soreness and inflammation of your sinuses. Sinuses are hollow spaces in the bones around your face. Your sinuses are located:  Around your eyes.  In the middle of your forehead.  Behind your nose.  In your cheekbones.  Your sinuses and nasal passages are lined with a stringy fluid (mucus). Mucus normally drains out of your sinuses. When your nasal tissues become inflamed or swollen, the mucus can become trapped or blocked so air cannot flow through your sinuses. This allows bacteria, viruses, and funguses to grow, which leads to infection. Sinusitis can develop quickly and last for 7?10 days (acute) or for more than 12 weeks (chronic). Sinusitis often develops after a cold. What are the causes? This condition is caused by anything that creates swelling in the sinuses or stops mucus from draining, including:  Allergies.    Asthma.  Bacterial or viral infection.  Abnormally shaped bones between the nasal passages.  Nasal growths that contain mucus (nasal polyps).  Narrow sinus openings.  Pollutants, such as chemicals or irritants in the air.  A foreign object stuck in the nose.  A fungal infection. This is rare.  What increases the risk? The following factors may make you more likely to develop this condition:  Having allergies or  asthma.  Having had a recent cold or respiratory tract infection.  Having structural deformities or blockages in your nose or sinuses.  Having a weak immune system.  Doing a lot of swimming or diving.  Overusing nasal sprays.  Smoking.  What are the signs or symptoms? The main symptoms of this condition are pain and a feeling of pressure around the affected sinuses. Other symptoms include:  Upper toothache.  Earache.  Headache.  Bad breath.  Decreased sense of smell and taste.  A cough that may get worse at night.  Fatigue.  Fever.  Thick drainage from your nose. The drainage is often green and it may contain pus (purulent).  Stuffy nose or congestion.  Postnasal drip. This is when extra mucus collects in the throat or back of the nose.  Swelling and warmth over the affected sinuses.  Sore throat.  Sensitivity to light.  How is this diagnosed? This condition is diagnosed based on symptoms, a medical history, and a physical exam. To find out if your condition is acute or chronic, your health care provider may:  Look in your nose for signs of nasal polyps.  Tap over the affected sinus to check for signs of infection.  View the inside of your sinuses using an imaging device that has a light attached (endoscope).  If your health care provider suspects that you have chronic sinusitis, you may also:  Be tested for allergies.  Have a sample of mucus taken from your nose (nasal culture) and checked for bacteria.  Have a mucus sample examined to see if your sinusitis is related to an allergy.  If your sinusitis does not respond to treatment and it lasts longer than 8 weeks, you may have an MRI or CT scan to check your sinuses. These scans also help to determine how severe your infection is. In rare cases, a bone biopsy may be done to rule out more serious types of fungal sinus disease. How is this treated? Treatment for sinusitis depends on the cause and  whether your condition is chronic or acute. If a virus is causing your sinusitis, your symptoms will go away on their own within 10 days. You may be given medicines to relieve your symptoms, including:  Topical nasal decongestants. They shrink swollen nasal passages and let mucus drain from your sinuses.  Antihistamines. These drugs block inflammation that is triggered by allergies. This can help to ease swelling in your nose and sinuses.  Topical nasal corticosteroids. These are nasal sprays that ease inflammation and swelling in your nose and sinuses.  Nasal saline washes. These rinses can help to get rid of thick mucus in your nose.  If your condition is caused by bacteria, you will be given an antibiotic medicine. If your condition is caused by a fungus, you will be given an antifungal medicine. Surgery may be needed to correct underlying conditions, such as narrow nasal passages. Surgery may also be needed to remove polyps. Follow these instructions at home: Medicines  Take, use, or apply over-the-counter and prescription medicines only as told by   your health care provider. These may include nasal sprays.  If you were prescribed an antibiotic medicine, take it as told by your health care provider. Do not stop taking the antibiotic even if you start to feel better. Hydrate and Humidify  Drink enough water to keep your urine clear or pale yellow. Staying hydrated will help to thin your mucus.  Use a cool mist humidifier to keep the humidity level in your home above 50%.  Inhale steam for 10-15 minutes, 3-4 times a day or as told by your health care provider. You can do this in the bathroom while a hot shower is running.  Limit your exposure to cool or dry air. Rest  Rest as much as possible.  Sleep with your head raised (elevated).  Make sure to get enough sleep each night. General instructions  Apply a warm, moist washcloth to your face 3-4 times a day or as told by your  health care provider. This will help with discomfort.  Wash your hands often with soap and water to reduce your exposure to viruses and other germs. If soap and water are not available, use hand sanitizer.  Do not smoke. Avoid being around people who are smoking (secondhand smoke).  Keep all follow-up visits as told by your health care provider. This is important. Contact a health care provider if:  You have a fever.  Your symptoms get worse.  Your symptoms do not improve within 10 days. Get help right away if:  You have a severe headache.  You have persistent vomiting.  You have pain or swelling around your face or eyes.  You have vision problems.  You develop confusion.  Your neck is stiff.  You have trouble breathing. This information is not intended to replace advice given to you by your health care provider. Make sure you discuss any questions you have with your health care provider. Document Released: 03/11/2005 Document Revised: 11/05/2015 Document Reviewed: 01/04/2015 Elsevier Interactive Patient Education  2018 [employer]  

## 2017-12-26 ENCOUNTER — Ambulatory Visit: Payer: Self-pay

## 2018-06-12 ENCOUNTER — Other Ambulatory Visit: Payer: Self-pay

## 2018-06-12 ENCOUNTER — Ambulatory Visit: Payer: Self-pay | Admitting: *Deleted

## 2018-06-12 VITALS — BP 112/82 | HR 81 | Ht 64.0 in | Wt 167.0 lb

## 2018-06-12 DIAGNOSIS — Z79899 Other long term (current) drug therapy: Secondary | ICD-10-CM

## 2018-06-12 DIAGNOSIS — Z0000 Encounter for general adult medical examination without abnormal findings: Secondary | ICD-10-CM

## 2018-06-12 DIAGNOSIS — R7989 Other specified abnormal findings of blood chemistry: Secondary | ICD-10-CM

## 2018-06-12 NOTE — Progress Notes (Signed)
[employer] insurance premium discount evaluation: Labs Drawn. Replacements ROI form signed. Tobacco Free Attestation form signed.  Forms placed in paper chart. Okay to route results to pcp, Junius Creamer, [facility].

## 2018-06-13 LAB — CMP12+LP+TP+TSH+6AC+CBC/D/PLT
ALT: 19 IU/L (ref 0–32)
AST: 19 IU/L (ref 0–40)
Albumin/Globulin Ratio: 1.5 (ref 1.2–2.2)
Albumin: 4.1 g/dL (ref 3.8–4.8)
Alkaline Phosphatase: 64 IU/L (ref 39–117)
BASOS ABS: 0.1 10*3/uL (ref 0.0–0.2)
BUN / CREAT RATIO: 20 (ref 12–28)
BUN: 15 mg/dL (ref 8–27)
Basos: 1 %
Bilirubin Total: 0.4 mg/dL (ref 0.0–1.2)
CHLORIDE: 95 mmol/L — AB (ref 96–106)
Calcium: 8.9 mg/dL (ref 8.7–10.3)
Chol/HDL Ratio: 2.9 ratio (ref 0.0–4.4)
Cholesterol, Total: 159 mg/dL (ref 100–199)
Creatinine, Ser: 0.75 mg/dL (ref 0.57–1.00)
EOS (ABSOLUTE): 0.3 10*3/uL (ref 0.0–0.4)
EOS: 4 %
Estimated CHD Risk: <0.5 times avg. (ref 0.0–1.0)
Free Thyroxine Index: 2.3 (ref 1.2–4.9)
GFR calc non Af Amer: 86 mL/min/{1.73_m2} (ref >59)
GFR, EST AFRICAN AMERICAN: 99 mL/min/{1.73_m2} (ref >59)
GGT: 16 IU/L (ref 0–60)
GLUCOSE: 85 mg/dL (ref 65–99)
Globulin, Total: 2.7 g/dL (ref 1.5–4.5)
HDL: 55 mg/dL (ref >39)
HEMOGLOBIN: 13.2 g/dL (ref 11.1–15.9)
Hematocrit: 39.1 % (ref 34.0–46.6)
IMMATURE GRANS (ABS): 0 10*3/uL (ref 0.0–0.1)
IMMATURE GRANULOCYTES: 0 %
IRON: 102 ug/dL (ref 27–139)
LDH: 194 IU/L (ref 119–226)
LDL Calculated: 86 mg/dL (ref 0–99)
LYMPHS: 26 %
Lymphocytes Absolute: 1.9 10*3/uL (ref 0.7–3.1)
MCH: 30.8 pg (ref 26.6–33.0)
MCHC: 33.8 g/dL (ref 31.5–35.7)
MCV: 91 fL (ref 79–97)
MONOCYTES: 8 %
Monocytes Absolute: 0.6 10*3/uL (ref 0.1–0.9)
NEUTROS PCT: 61 %
Neutrophils Absolute: 4.4 10*3/uL (ref 1.4–7.0)
PHOSPHORUS: 4.1 mg/dL (ref 3.0–4.3)
PLATELETS: 248 10*3/uL (ref 150–450)
Potassium: 4.4 mmol/L (ref 3.5–5.2)
RBC: 4.28 x10E6/uL (ref 3.77–5.28)
RDW: 12.4 % (ref 11.7–15.4)
Sodium: 132 mmol/L — ABNORMAL LOW (ref 134–144)
T3 UPTAKE RATIO: 26 % (ref 24–39)
T4 TOTAL: 8.9 ug/dL (ref 4.5–12.0)
TOTAL PROTEIN: 6.8 g/dL (ref 6.0–8.5)
TRIGLYCERIDES: 89 mg/dL (ref 0–149)
TSH: 0.278 u[IU]/mL — ABNORMAL LOW (ref 0.450–4.500)
URIC ACID: 4 mg/dL (ref 2.5–7.1)
VLDL CHOLESTEROL CAL: 18 mg/dL (ref 5–40)
WBC: 7.2 10*3/uL (ref 3.4–10.8)

## 2018-06-13 LAB — VITAMIN D 25 HYDROXY (VIT D DEFICIENCY, FRACTURES): Vit D, 25-Hydroxy: 36.6 ng/mL (ref 30.0–100.0)

## 2018-06-13 LAB — HGB A1C W/O EAG: HEMOGLOBIN A1C: 5.5 % (ref 4.8–5.6)

## 2018-06-13 LAB — MAGNESIUM: Magnesium: 1.8 mg/dL (ref 1.6–2.3)

## 2018-06-15 NOTE — Progress Notes (Signed)
noted 

## 2018-07-16 ENCOUNTER — Other Ambulatory Visit: Payer: Self-pay | Admitting: *Deleted

## 2018-07-16 ENCOUNTER — Other Ambulatory Visit: Payer: Self-pay | Admitting: Registered Nurse

## 2018-07-16 MED ORDER — FLUTICASONE PROPIONATE 50 MCG/ACT NA SUSP: 1.0000 | Freq: Two times a day (BID) | NASAL | 1 refills | Status: DC

## 2019-02-16 ENCOUNTER — Other Ambulatory Visit: Payer: Self-pay

## 2019-02-16 ENCOUNTER — Encounter: Payer: Self-pay | Admitting: Registered Nurse

## 2019-02-16 ENCOUNTER — Ambulatory Visit: Payer: Self-pay | Admitting: Registered Nurse

## 2019-02-16 VITALS — BP 123/91 | HR 82 | Temp 97.7°F | Resp 16

## 2019-02-16 DIAGNOSIS — R238 Other skin changes: Secondary | ICD-10-CM

## 2019-02-16 DIAGNOSIS — J0110 Acute frontal sinusitis, unspecified: Secondary | ICD-10-CM

## 2019-02-16 DIAGNOSIS — R233 Spontaneous ecchymoses: Secondary | ICD-10-CM

## 2019-02-16 DIAGNOSIS — E039 Hypothyroidism, unspecified: Secondary | ICD-10-CM

## 2019-02-16 DIAGNOSIS — H6121 Impacted cerumen, right ear: Secondary | ICD-10-CM

## 2019-02-16 MED ORDER — SALINE SPRAY 0.65 % NA SOLN: 2.0000 | NASAL | 0 refills | Status: DC

## 2019-02-16 MED ORDER — MONTELUKAST SODIUM 10 MG PO TABS: 10.0000 mg | ORAL_TABLET | Freq: Every day | ORAL | 3 refills | Status: DC

## 2019-02-16 MED ORDER — CEPHALEXIN 500 MG PO CAPS: 500.0000 mg | ORAL_CAPSULE | Freq: Two times a day (BID) | ORAL | 0 refills | Status: AC

## 2019-02-16 MED ORDER — FLUTICASONE PROPIONATE 50 MCG/ACT NA SUSP: NASAL | 1 refills | Status: AC

## 2019-02-16 NOTE — Patient Instructions (Addendum)
Sinusitis, Adult Sinusitis is inflammation of your sinuses. Sinuses are hollow spaces in the bones around your face. Your sinuses are located:  Around your eyes.  In the middle of your forehead.  Behind your nose.  In your cheekbones. Mucus normally drains out of your sinuses. When your nasal tissues become inflamed or swollen, mucus can become trapped or blocked. This allows bacteria, viruses, and fungi to grow, which leads to infection. Most infections of the sinuses are caused by a virus. Sinusitis can develop quickly. It can last for up to 4 weeks (acute) or for more than 12 weeks (chronic). Sinusitis often develops after a cold. What are the causes? This condition is caused by anything that creates swelling in the sinuses or stops mucus from draining. This includes:  Allergies.  Asthma.  Infection from bacteria or viruses.  Deformities or blockages in your nose or sinuses.  Abnormal growths in the nose (nasal polyps).  Pollutants, such as chemicals or irritants in the air.  Infection from fungi (rare). What increases the risk? You are more likely to develop this condition if you:  Have a weak body defense system (immune system).  Do a lot of swimming or diving.  Overuse nasal sprays.  Smoke. What are the signs or symptoms? The main symptoms of this condition are pain and a feeling of pressure around the affected sinuses. Other symptoms include:  Stuffy nose or congestion.  Thick drainage from your nose.  Swelling and warmth over the affected sinuses.  Headache.  Upper toothache.  A cough that may get worse at night.  Extra mucus that collects in the throat or the back of the nose (postnasal drip).  Decreased sense of smell and taste.  Fatigue.  A fever.  Sore throat.  Bad breath. How is this diagnosed? This condition is diagnosed based on:  Your symptoms.  Your medical history.  A physical exam.  Tests to find out if your condition is  acute or chronic. This may include: ? Checking your nose for nasal polyps. ? Viewing your sinuses using a device that has a light (endoscope). ? Testing for allergies or bacteria. ? Imaging tests, such as an MRI or CT scan. In rare cases, a bone biopsy may be done to rule out more serious types of fungal sinus disease. How is this treated? Treatment for sinusitis depends on the cause and whether your condition is chronic or acute.  If caused by a virus, your symptoms should go away on their own within 10 days. You may be given medicines to relieve symptoms. They include: ? Medicines that shrink swollen nasal passages (topical intranasal decongestants). ? Medicines that treat allergies (antihistamines). ? A spray that eases inflammation of the nostrils (topical intranasal corticosteroids). ? Rinses that help get rid of thick mucus in your nose (nasal saline washes).  If caused by bacteria, your health care provider may recommend waiting to see if your symptoms improve. Most bacterial infections will get better without antibiotic medicine. You may be given antibiotics if you have: ? A severe infection. ? A weak immune system.  If caused by narrow nasal passages or nasal polyps, you may need to have surgery. Follow these instructions at home: Medicines  Take, use, or apply over-the-counter and prescription medicines only as told by your health care provider. These may include nasal sprays.  If you were prescribed an antibiotic medicine, take it as told by your health care provider. Do not stop taking the antibiotic even if you start   to feel better. Hydrate and humidify   Drink enough fluid to keep your urine pale yellow. Staying hydrated will help to thin your mucus.  Use a cool mist humidifier to keep the humidity level in your home above 50%.  Inhale steam for 10-15 minutes, 3-4 times a day, or as told by your health care provider. You can do this in the bathroom while a hot shower is  running.  Limit your exposure to cool or dry air. Rest  Rest as much as possible.  Sleep with your head raised (elevated).  Make sure you get enough sleep each night. General instructions   Apply a warm, moist washcloth to your face 3-4 times a day or as told by your health care provider. This will help with discomfort.  Wash your hands often with soap and water to reduce your exposure to germs. If soap and water are not available, use hand sanitizer.  Do not smoke. Avoid being around people who are smoking (secondhand smoke).  Keep all follow-up visits as told by your health care provider. This is important. Contact a health care provider if:  You have a fever.  Your symptoms get worse.  Your symptoms do not improve within 10 days. Get help right away if:  You have a severe headache.  You have persistent vomiting.  You have severe pain or swelling around your face or eyes.  You have vision problems.  You develop confusion.  Your neck is stiff.  You have trouble breathing. Summary  Sinusitis is soreness and inflammation of your sinuses. Sinuses are hollow spaces in the bones around your face.  This condition is caused by nasal tissues that become inflamed or swollen. The swelling traps or blocks the flow of mucus. This allows bacteria, viruses, and fungi to grow, which leads to infection.  If you were prescribed an antibiotic medicine, take it as told by your health care provider. Do not stop taking the antibiotic even if you start to feel better.  Keep all follow-up visits as told by your health care provider. This is important. This information is not intended to replace advice given to you by your health care provider. Make sure you discuss any questions you have with your health care provider. Document Released: 03/11/2005 Document Revised: 08/11/2017 Document Reviewed: 08/11/2017 Elsevier Patient Education  2020 [employer]. How to Perform a Sinus Rinse A  sinus rinse is a home treatment that is used to rinse your sinuses with a sterile mixture of salt and water (saline solution). Sinuses are air-filled spaces in your skull behind the bones of your face and forehead that open into your nasal cavity. A sinus rinse can help to clear mucus, dirt, dust, or pollen from your nasal cavity. You may do a sinus rinse when you have a cold, a virus, nasal allergy symptoms, a sinus infection, or stuffiness in your nose or sinuses. Talk with your health care provider about whether a sinus rinse might help you. What are the risks? A sinus rinse is generally safe and effective. However, there are a few risks, which include:  A burning sensation in your sinuses. This may happen if you do not make the saline solution as directed. Be sure to follow all directions when making the saline solution.  Nasal irritation.  Infection from contaminated water. This is rare, but possible. Do not do a sinus rinse if you have had ear or nasal surgery, ear infection, or blocked ears. Supplies needed:  Saline solution or  powder.  Distilled or sterile water may be needed to mix with saline powder. ? You may use boiled and cooled tap water. Boil tap water for 5 minutes; cool until it is lukewarm. Use within 24 hours. ? Do not use regular tap water to mix with the saline solution.  Neti pot or nasal rinse bottle. These supplies release the saline solution into your nose and through your sinuses. Neti pots and nasal rinse bottles can be purchased at [employer], a health food store, or online. How to perform a sinus rinse  1. Wash your hands with soap and water. 2. Wash your device according to the directions that came with the product and then dry it. 3. Use the solution that comes with your product or one that is sold separately in stores. Follow the mixing directions on the package if you need to mix with sterile or distilled water. 4. Fill the device with the amount of  saline solution noted in the device instructions. 5. Stand over a sink and tilt your head sideways over the sink. 6. Place the spout of the device in your upper nostril (the one closer to the ceiling). 7. Gently pour or squeeze the saline solution into your nasal cavity. The liquid should drain out from the lower nostril if you are not too congested. 8. While rinsing, breathe through your open mouth. 9. Gently blow your nose to clear any mucus and rinse solution. Blowing too hard may cause ear pain. 10. Repeat in your other nostril. 11. Clean and rinse your device with clean water and then air-dry it. Talk with your health care provider or pharmacist if you have questions about how to do a sinus rinse. Summary  A sinus rinse is a home treatment that is used to rinse your sinuses with a sterile mixture of salt and water (saline solution).  A sinus rinse is generally safe and effective. Follow all instructions carefully.  Before doing a sinus rinse, talk with your health care provider about whether it would be helpful for you. This information is not intended to replace advice given to you by your health care provider. Make sure you discuss any questions you have with your health care provider. Document Released: 10/06/2013 Document Revised: 01/06/2017 Document Reviewed: 01/06/2017 Elsevier Patient Education  2020 [employer]. Hyperthyroidism  Hyperthyroidism is when the thyroid gland is too active (overactive). The thyroid gland is a small gland located in the lower front part of the neck, just in front of the windpipe (trachea). This gland makes hormones that help control how the body uses food for energy (metabolism) as well as how the heart and brain function. These hormones also play a role in keeping your bones strong. When the thyroid is overactive, it produces too much of a hormone called thyroxine. What are the causes? This condition may be caused by:  Graves' disease. This is a  disorder in which the body's disease-fighting system (immune system) attacks the thyroid gland. This is the most common cause.  Inflammation of the thyroid gland.  A tumor in the thyroid gland.  Use of certain medicines, including: ? Prescription thyroid hormone replacement. ? Herbal supplements that mimic thyroid hormones. ? Amiodarone therapy.  Solid or fluid-filled lumps within your thyroid gland (thyroid nodules).  Taking in a large amount of iodine from foods or medicines. What increases the risk? You are more likely to develop this condition if:  You are female.  You have a family history of thyroid  conditions.  You smoke tobacco.  You use a medicine called lithium.  You take medicines that affect the immune system (immunosuppressants). What are the signs or symptoms? Symptoms of this condition include:  Nervousness.  Inability to tolerate heat.  Unexplained weight loss.  Diarrhea.  Change in the texture of hair or skin.  Heart skipping beats or making extra beats.  Rapid heart rate.  Loss of menstruation.  Shaky hands.  Fatigue.  Restlessness.  Sleep problems.  Enlarged thyroid gland or a lump in the thyroid (nodule). You may also have symptoms of Graves' disease, which may include:  Protruding eyes.  Dry eyes.  Red or swollen eyes.  Problems with vision. How is this diagnosed? This condition may be diagnosed based on:  Your symptoms and medical history.  A physical exam.  Blood tests.  Thyroid ultrasound. This test involves using sound waves to produce images of the thyroid gland.  A thyroid scan. A radioactive substance is injected into a vein, and images show how much iodine is present in the thyroid.  Radioactive iodine uptake test (RAIU). A small amount of radioactive iodine is given by mouth to see how much iodine the thyroid absorbs after a certain amount of time. How is this treated? Treatment depends on the cause and  severity of the condition. Treatment may include:  Medicines to reduce the amount of thyroid hormone your body makes.  Radioactive iodine treatment (radioiodine therapy). This involves swallowing a small dose of radioactive iodine, in capsule or liquid form, to kill thyroid cells.  Surgery to remove part or all of your thyroid gland. You may need to take thyroid hormone replacement medicine for the rest of your life after thyroid surgery.  Medicines to help manage your symptoms. Follow these instructions at home:   Take over-the-counter and prescription medicines only as told by your health care provider.  Do not use any products that contain nicotine or tobacco, such as cigarettes and e-cigarettes. If you need help quitting, ask your health care provider.  Follow any instructions from your health care provider about diet. You may be instructed to limit foods that contain iodine.  Keep all follow-up visits as told by your health care provider. This is important. ? You will need to have blood tests regularly so that your health care provider can monitor your condition. Contact a health care provider if:  Your symptoms do not get better with treatment.  You have a fever.  You are taking thyroid hormone replacement medicine and you: ? Have symptoms of depression. ? Feel like you are tired all the time. ? Gain weight. Get help right away if:  You have chest pain.  You have decreased alertness or a change in your awareness.  You have abdominal pain.  You feel dizzy.  You have a rapid heartbeat.  You have an irregular heartbeat.  You have difficulty breathing. Summary  The thyroid gland is a small gland located in the lower front part of the neck, just in front of the windpipe (trachea).  Hyperthyroidism is when the thyroid gland is too active (overactive) and produces too much of a hormone called thyroxine.  The most common cause is Graves' disease, a disorder in which  your immune system attacks the thyroid gland.  Hyperthyroidism can cause various symptoms, such as unexplained weight loss, nervousness, inability to tolerate heat, or changes in your heartbeat.  Treatment may include medicine to reduce the amount of thyroid hormone your body makes, radioiodine therapy, surgery,  or medicines to manage symptoms. This information is not intended to replace advice given to you by your health care provider. Make sure you discuss any questions you have with your health care provider. Document Released: 03/11/2005 Document Revised: 02/21/2017 Document Reviewed: 02/19/2017 Elsevier Patient Education  [place]. Hypothyroidism  Hypothyroidism is when the thyroid gland does not make enough of certain hormones (it is underactive). The thyroid gland is a small gland located in the lower front part of the neck, just in front of the windpipe (trachea). This gland makes hormones that help control how the body uses food for energy (metabolism) as well as how the heart and brain function. These hormones also play a role in keeping your bones strong. When the thyroid is underactive, it produces too little of the hormones thyroxine (T4) and triiodothyronine (T3). What are the causes? This condition may be caused by:  Hashimoto's disease. This is a disease in which the body's disease-fighting system (immune system) attacks the thyroid gland. This is the most common cause.  Viral infections.  Pregnancy.  Certain medicines.  Birth defects.  Past radiation treatments to the head or neck for cancer.  Past treatment with radioactive iodine.  Past exposure to radiation in the environment.  Past surgical removal of part or all of the thyroid.  Problems with a gland in the center of the brain (pituitary gland).  Lack of enough iodine in the diet. What increases the risk? You are more likely to develop this condition if:  You are female.  You have a family  history of thyroid conditions.  You use a medicine called lithium.  You take medicines that affect the immune system (immunosuppressants). What are the signs or symptoms? Symptoms of this condition include:  Feeling as though you have no energy (lethargy).  Not being able to tolerate cold.  Weight gain that is not explained by a change in diet or exercise habits.  Lack of appetite.  Dry skin.  Coarse hair.  Menstrual irregularity.  Slowing of thought processes.  Constipation.  Sadness or depression. How is this diagnosed? This condition may be diagnosed based on:  Your symptoms, your medical history, and a physical exam.  Blood tests. You may also have imaging tests, such as an ultrasound or MRI. How is this treated? This condition is treated with medicine that replaces the thyroid hormones that your body does not make. After you begin treatment, it may take several weeks for symptoms to go away. Follow these instructions at home:  Take over-the-counter and prescription medicines only as told by your health care provider.  If you start taking any new medicines, tell your health care provider.  Keep all follow-up visits as told by your health care provider. This is important. ? As your condition improves, your dosage of thyroid hormone medicine may change. ? You will need to have blood tests regularly so that your health care provider can monitor your condition. Contact a health care provider if:  Your symptoms do not get better with treatment.  You are taking thyroid replacement medicine and you: ? Sweat a lot. ? Have tremors. ? Feel anxious. ? Lose weight rapidly. ? Cannot tolerate heat. ? Have emotional swings. ? Have diarrhea. ? Feel weak. Get help right away if you have:  Chest pain.  An irregular heartbeat.  A rapid heartbeat.  Difficulty breathing. Summary  Hypothyroidism is when the thyroid gland does not make enough of certain hormones (it  is underactive).  When  the thyroid is underactive, it produces too little of the hormones thyroxine (T4) and triiodothyronine (T3).  The most common cause is Hashimoto's disease, a disease in which the body's disease-fighting system (immune system) attacks the thyroid gland. The condition can also be caused by viral infections, medicine, pregnancy, or past radiation treatment to the head or neck.  Symptoms may include weight gain, dry skin, constipation, feeling as though you do not have energy, and not being able to tolerate cold.  This condition is treated with medicine to replace the thyroid hormones that your body does not make. This information is not intended to replace advice given to you by your health care provider. Make sure you discuss any questions you have with your health care provider. Document Released: 03/11/2005 Document Revised: 02/21/2017 Document Reviewed: 02/19/2017 Elsevier Patient Education  2020 [employer]  Earwax Buildup, Adult The ears produce a substance called earwax that helps keep bacteria out of the ear and protects the skin in the ear canal. Occasionally, earwax can build up in the ear and cause discomfort or hearing loss. What increases the risk? This condition is more likely to develop in people who:  Are female.  Are elderly.  Naturally produce more earwax.  Clean their ears often with cotton swabs.  Use earplugs often.  Use in-ear headphones often.  Wear hearing aids.  Have narrow ear canals.  Have earwax that is overly thick or sticky.  Have eczema.  Are dehydrated.  Have excess hair in the ear canal. What are the signs or symptoms? Symptoms of this condition include:  Reduced or muffled hearing.  A feeling of fullness in the ear or feeling that the ear is plugged.  Fluid coming from the ear.  Ear pain.  Ear itch.  Ringing in the ear.  Coughing.  An obvious piece of earwax that can be seen inside the ear canal. How is  this diagnosed? This condition may be diagnosed based on:  Your symptoms.  Your medical history.  An ear exam. During the exam, your health care provider will look into your ear with an instrument called an otoscope. You may have tests, including a hearing test. How is this treated? This condition may be treated by:  Using ear drops to soften the earwax.  Having the earwax removed by a health care provider. The health care provider may: ? Flush the ear with water. ? Use an instrument that has a loop on the end (curette). ? Use a suction device.  Surgery to remove the wax buildup. This may be done in severe cases. Follow these instructions at home:   Take over-the-counter and prescription medicines only as told by your health care provider.  Do not put any objects, including cotton swabs, into your ear. You can clean the opening of your ear canal with a washcloth or facial tissue.  Follow instructions from your health care provider about cleaning your ears. Do not over-clean your ears.  Drink enough fluid to keep your urine clear or pale yellow. This will help to thin the earwax.  Keep all follow-up visits as told by your health care provider. If earwax builds up in your ears often or if you use hearing aids, consider seeing your health care provider for routine, preventive ear cleanings. Ask your health care provider how often you should schedule your cleanings.  If you have hearing aids, clean them according to instructions from the manufacturer and your health care provider. Contact a health care provider if:  You have ear pain.  You develop a fever.  You have blood, pus, or other fluid coming from your ear.  You have hearing loss.  You have ringing in your ears that does not go away.  Your symptoms do not improve with treatment.  You feel like the room is spinning (vertigo). Summary  Earwax can build up in the ear and cause discomfort or hearing loss.  The most  common symptoms of this condition include reduced or muffled hearing and a feeling of fullness in the ear or feeling that the ear is plugged.  This condition may be diagnosed based on your symptoms, your medical history, and an ear exam.  This condition may be treated by using ear drops to soften the earwax or by having the earwax removed by a health care provider.  Do not put any objects, including cotton swabs, into your ear. You can clean the opening of your ear canal with a washcloth or facial tissue. This information is not intended to replace advice given to you by your health care provider. Make sure you discuss any questions you have with your health care provider. Document Released: 04/18/2004 Document Revised: 02/21/2017 Document Reviewed: 05/22/2016 Elsevier Patient Education  2020 [employer].

## 2019-02-16 NOTE — Progress Notes (Signed)
Subjective:    Patient ID: Victoria Morales, female    DOB: 1955/10/25, 63 y.o.   MRN: [id number]  63y/o caucasian female established patient here for sinus pressure behind the eyes for one month.  I can't get this sinus infection to go away.  Do you think it is my mask?  I have tried flonase, saline and taking my singulair.  Nothing has been helping  I wash my mask after each use and only wear it one day before washing"  Patient allergic to penicillin has used doxycycline and keflex to treat sinus/ear infections in the past.  Doxycycline failed to work in 2017 but keflex never failed per paper chart review.  Patient denied fever/chills/teeth pain/ear discharge, dyspnea, wheezing, cough.  She did have some blood tinged mucous last week increased her use of nasal saline and blood cleared up.  My ears have been really itchy also.  Patient reported she has been bruising when lifting heavy items when asked about bruises on her arms.  Last appt with [facility] Mar 2020 no changes to her thyroid medication (synthroid generic daily) at that time TSH 0.278 per Epic     Review of Systems  Constitutional: Negative for activity change, appetite change, chills, diaphoresis, fatigue, fever and unexpected weight change.  HENT: Positive for congestion, nosebleeds, postnasal drip, sinus pressure and sinus pain. Negative for dental problem, drooling, ear discharge, ear pain, facial swelling, hearing loss, mouth sores, rhinorrhea, sneezing, sore throat, tinnitus, trouble swallowing and voice change.   Eyes: Negative for photophobia, pain, discharge, redness, itching and visual disturbance.  Respiratory: Negative for cough, choking, chest tightness, shortness of breath, wheezing and stridor.   Cardiovascular: Negative for chest pain.  Gastrointestinal: Negative for diarrhea, nausea and vomiting.  Endocrine: Negative for cold intolerance and heat intolerance.  Genitourinary: Negative for difficulty urinating.   Musculoskeletal: Negative for back pain, gait problem, neck pain and neck stiffness.  Skin: Positive for color change. Negative for pallor, rash and wound.  Allergic/Immunologic: Positive for environmental allergies. Negative for food allergies.  Neurological: Negative for dizziness, tremors, seizures, syncope, facial asymmetry, speech difficulty, weakness, light-headedness, numbness and headaches.  Hematological: Negative for adenopathy. Bruises/bleeds easily.  Psychiatric/Behavioral: Negative for agitation, behavioral problems, confusion and sleep disturbance.       Objective:   Physical Exam Vitals signs and nursing note reviewed.  Constitutional:      General: She is awake. She is not in acute distress.    Appearance: Normal appearance. She is well-developed and well-groomed. She is not ill-appearing, toxic-appearing or diaphoretic.  HENT:     Head: Normocephalic and atraumatic.     Jaw: There is normal jaw occlusion. No trismus.     Salivary Glands: Right salivary gland is not diffusely enlarged or tender. Left salivary gland is not diffusely enlarged or tender.     Right Ear: Hearing, ear canal and external ear normal. A middle ear effusion is present. There is impacted cerumen.     Left Ear: Hearing, ear canal and external ear normal. A middle ear effusion is present. There is no impacted cerumen.     Ears:     Comments: Dark brown wax occluding 100% right auditory canal; dry flaky skin noted left canal    Nose: Mucosal edema, congestion and rhinorrhea present. No nasal deformity, septal deviation or laceration.     Right Turbinates: Enlarged and swollen. Not pale.     Left Turbinates: Enlarged and swollen. Not pale.     Right  Sinus: Frontal sinus tenderness present. No maxillary sinus tenderness.     Left Sinus: Frontal sinus tenderness present. No maxillary sinus tenderness.     Comments: Mildly TTP frontal sinuses bilaterally; allergic shiners bilateral; lower eyelids  nonpitting edema 1+/4 bilaterally; cobblestoning posterior pharynx; clear discharge bilateral nasal turbinates edema/erythema    Mouth/Throat:     Lips: Pink. No lesions.     Mouth: Mucous membranes are moist. Mucous membranes are not pale, not dry and not cyanotic. No injury, lacerations, oral lesions or angioedema.     Dentition: No gingival swelling, dental caries, dental abscesses or gum lesions.     Tongue: No lesions. Tongue does not deviate from midline.     Palate: No mass and lesions.     Pharynx: Uvula midline. Pharyngeal swelling and posterior oropharyngeal erythema present. No oropharyngeal exudate or uvula swelling.     Tonsils: No tonsillar exudate or tonsillar abscesses. 0 on the right. 0 on the left.  Eyes:     General: Lids are normal. Vision grossly intact. Gaze aligned appropriately. Allergic shiner present. No visual field deficit or scleral icterus.       Right eye: No foreign body, discharge or hordeolum.        Left eye: No foreign body, discharge or hordeolum.     Extraocular Movements: Extraocular movements intact.     Right eye: Normal extraocular motion and no nystagmus.     Left eye: Normal extraocular motion and no nystagmus.     Conjunctiva/sclera: Conjunctivae normal.     Right eye: Right conjunctiva is not injected. No chemosis, exudate or hemorrhage.    Left eye: Left conjunctiva is not injected. No chemosis, exudate or hemorrhage.    Pupils: Pupils are equal, round, and reactive to light. Pupils are equal.     Right eye: Pupil is round and reactive.     Left eye: Pupil is round and reactive.  Neck:     Musculoskeletal: Normal range of motion and neck supple. Normal range of motion. No edema, erythema, neck rigidity, crepitus, injury, pain with movement, torticollis, spinous process tenderness or muscular tenderness.     Thyroid: No thyroid mass, thyromegaly or thyroid tenderness.     Trachea: Trachea and phonation normal. No tracheal tenderness or tracheal  deviation.  Cardiovascular:     Rate and Rhythm: Normal rate and regular rhythm.     Chest Wall: PMI is not displaced.     Pulses: Normal pulses.          Radial pulses are 2+ on the right side and 2+ on the left side.     Heart sounds: Normal heart sounds, S1 normal and S2 normal. No murmur. No friction rub. No gallop.   Pulmonary:     Effort: Pulmonary effort is normal. No accessory muscle usage or respiratory distress.     Breath sounds: Normal breath sounds and air entry. No stridor, decreased air movement or transmitted upper airway sounds. No decreased breath sounds, wheezing, rhonchi or rales.     Comments: Wearing cloth mask due to covid 19 pandemic; no cough observed in exam room; spoke full sentences without difficulty Chest:     Chest wall: No tenderness.  Abdominal:     General: Abdomen is flat. There is no distension.     Palpations: Abdomen is soft.  Musculoskeletal: Normal range of motion.        General: No swelling, tenderness, deformity or signs of injury.     Right shoulder: Normal.  Left shoulder: Normal.     Right elbow: Normal.    Left elbow: Normal.     Right hip: Normal.     Left hip: Normal.     Right knee: Normal.     Left knee: Normal.     Right ankle: Normal.     Left ankle: Normal.     Cervical back: Normal.     Thoracic back: Normal.     Lumbar back: Normal.     Right hand: Normal.     Left hand: Normal.     Right lower leg: No edema.     Left lower leg: No edema.  Lymphadenopathy:     Head:     Right side of head: No submental, submandibular, tonsillar, preauricular, posterior auricular or occipital adenopathy.     Left side of head: No submental, submandibular, tonsillar, preauricular, posterior auricular or occipital adenopathy.     Cervical: No cervical adenopathy.     Right cervical: No superficial, deep or posterior cervical adenopathy.    Left cervical: No superficial, deep or posterior cervical adenopathy.  Skin:    General: Skin is  warm and dry.     Capillary Refill: Capillary refill takes less than 2 seconds.     Coloration: Skin is not ashen, cyanotic, jaundiced, mottled, pale or sallow.     Findings: Bruising present. No abrasion, abscess, acne, burn, ecchymosis, erythema, laceration, lesion, petechiae, rash or wound.     Nails: There is no clubbing.        Neurological:     General: No focal deficit present.     Mental Status: She is alert and oriented to person, place, and time. Mental status is at baseline. She is not disoriented.     GCS: GCS eye subscore is 4. GCS verbal subscore is 5. GCS motor subscore is 6.     Cranial Nerves: Cranial nerves are intact. No cranial nerve deficit, dysarthria or facial asymmetry.     Sensory: Sensation is intact. No sensory deficit.     Motor: Motor function is intact. No weakness, tremor, atrophy, abnormal muscle tone or seizure activity.     Coordination: Coordination is intact. Coordination normal.     Gait: Gait is intact. Gait normal.     Comments: Gait sure and steady in hallway; in/out of chair without difficulty; bilateral hand grasp equal 5/5  Psychiatric:        Attention and Perception: Attention and perception normal.        Mood and Affect: Mood and affect normal.        Speech: Speech normal.        Behavior: Behavior normal. Behavior is cooperative.        Thought Content: Thought content normal.        Cognition and Memory: Cognition and memory normal.        Judgment: Judgment normal.           Assessment & Plan:  A-acute frontal sinusitis nonrecurrent, bruising, hypothyroidism, cerumen impaction right ear, seasonal allergic rhinitis  P-Keflex  po BID x 10 days #30 RF0 dispensed from [facility] to patient.  Refilled electronic rx flonase 1 spray each nostril BID #48 RF1, saline 2 sprays each nostril q2h wa prn congestion.  Denied personal or family history of ENT cancer.  Shower BID especially prior to bed. No evidence of systemic bacterial infection,  non toxic and well hydrated.  I do not see where any further testing or imaging is necessary  at this time.   I will suggest supportive care, rest, good hygiene and encourage the patient to take adequate fluids.  The patient is to return to clinic or EMERGENCY ROOM if symptoms worsen or change significantly.  Exitcare handout on sinusitis and sinus rinse.  Patient verbalized agreement and understanding of treatment plan and had no further questions at this time.   P2:  Hand washing and cover cough  Electronic Rx refilled singulair 10mg  po QHS #90 RF3 to her pharmacy of choice.  Patient may use normal saline nasal spray 2 sprays each nostril q2h wa as needed. flonase 27mcg 1 spray each nostril BID #48 RF1.  Patient denied personal or family history of ENT cancer. Avoid triggers if possible.  Shower prior to bedtime if exposed to triggers.  If allergic dust/dust mites recommend mattress/pillow covers/encasements; washing linens, vacuuming, sweeping, dusting weekly.  Call or return to clinic as needed if these symptoms worsen or fail to improve as anticipated.   Exitcare handout on allergic rhinitis and sinus rinse.  Patient verbalized understanding of instructions, agreed with plan of care and had no further questions at this time.  P2:  Avoidance and hand washing.  Bruises right arm greater than left.  Low TSH Mar 2020 saw PCM no change to levothyroxine dosing still on 136mcg po daily.  Will redraw thyroid function tests next week.  Patient to coordinate with RN Hildred Alamin nonfasting since clinic closed the rest of this week due to holiday and RN Hildred Alamin performing drive up flu clinic in parking lot now and [facility] has already picked up specimens.  Exitcare handout on hyper/hypothyroidism.  Discussed with patient bruising easier than normal can be a symptom of her thyroid levels being out of normal range.  Patient agreed with plan of care and had no further questions at this time.    Try 5-10gtts mineral oil external  auditory canal daily right and may put cotton ball at external ear for 20 minutes after drops instilled.  May also instill mineral oil left canal for itchy/dry skin to help moisturize and decrease irritation.  When in clinic have provider check ears to see if buildup with this method again.  Discussed purpose of earwax with patient.  Avoid cotton applicator (Q-tip) use in ears.  exitcare handout on cerumen impaction  Once wax softened will attempt curettage and if needed RN Hildred Alamin will perform lavage.  Patient verbalized understanding, agreed with plan of care and had no further questions at this time.

## 2019-02-25 ENCOUNTER — Ambulatory Visit: Payer: Self-pay | Admitting: *Deleted

## 2019-02-25 ENCOUNTER — Other Ambulatory Visit: Payer: Self-pay

## 2019-02-25 DIAGNOSIS — E039 Hypothyroidism, unspecified: Secondary | ICD-10-CM

## 2019-02-25 NOTE — Progress Notes (Signed)
Repeat thyroid function panel per NP Betancourt orders due to low TSH March 2020 with no changes to dosing since then by pcp. Increased bruising to forearms noted by clinic staff. Pt denies any other hyper- or hypo-thyroidism related sx.

## 2019-02-26 LAB — THYROID PANEL WITH TSH
Free Thyroxine Index: 1.7 (ref 1.2–4.9)
T3 Uptake Ratio: 26 % (ref 24–39)
T4, Total: 6.7 ug/dL (ref 4.5–12.0)
TSH: 1.62 u[IU]/mL (ref 0.450–4.500)

## 2019-02-26 NOTE — Progress Notes (Signed)
Results reviewed with pt over phone. Advised her to continue current dosing. Also reviewed hyperthyroid sx for reference; wt loss, palpitations/rapid HR, mood swings, warm skin, insomnia. Pt denies any of these current sx and has no further questions/concerns. Can view on MyChart and declines hard copy of labs. Pt's previous pcp that was managing Levothyroxine left practice just after last TSH check and pt has not been assigned to a new provider yet as she has not been seen in the office. Planning annual visit in early 2021. So results not routed to anyone.

## 2019-02-27 NOTE — Progress Notes (Signed)
noted 

## 2019-06-17 ENCOUNTER — Other Ambulatory Visit: Payer: Self-pay

## 2019-06-17 ENCOUNTER — Ambulatory Visit (HOSPITAL_COMMUNITY)
Admission: EM | Admit: 2019-06-17 | Discharge: 2019-06-17 | Disposition: A | Discharge status: Home / Self Care | Payer: PRIVATE HEALTH INSURANCE | Attending: Internal Medicine | Admitting: Internal Medicine

## 2019-06-17 ENCOUNTER — Encounter (HOSPITAL_COMMUNITY): Payer: Self-pay

## 2019-06-17 ENCOUNTER — Telehealth: Payer: Self-pay | Admitting: Registered Nurse

## 2019-06-17 ENCOUNTER — Encounter: Payer: Self-pay | Admitting: Registered Nurse

## 2019-06-17 ENCOUNTER — Ambulatory Visit: Payer: Self-pay | Admitting: Registered Nurse

## 2019-06-17 VITALS — BP 145/95 | HR 87 | Resp 18

## 2019-06-17 DIAGNOSIS — R1084 Generalized abdominal pain: Secondary | ICD-10-CM

## 2019-06-17 DIAGNOSIS — R143 Flatulence: Secondary | ICD-10-CM

## 2019-06-17 DIAGNOSIS — R0789 Other chest pain: Secondary | ICD-10-CM | POA: Insufficient documentation

## 2019-06-17 DIAGNOSIS — R079 Chest pain, unspecified: Secondary | ICD-10-CM

## 2019-06-17 LAB — CBC WITH DIFFERENTIAL/PLATELET
Abs Immature Granulocytes: 0.02 10*3/uL (ref 0.00–0.07)
Basophils Absolute: 0.1 10*3/uL (ref 0.0–0.1)
Basophils Relative: 1 %
Eosinophils Absolute: 0.2 10*3/uL (ref 0.0–0.5)
Eosinophils Relative: 2 %
HCT: 39.9 % (ref 36.0–46.0)
Hemoglobin: 13.4 g/dL (ref 12.0–15.0)
Immature Granulocytes: 0 %
Lymphocytes Relative: 24 %
Lymphs Abs: 2.1 10*3/uL (ref 0.7–4.0)
MCH: 30.6 pg (ref 26.0–34.0)
MCHC: 33.6 g/dL (ref 30.0–36.0)
MCV: 91.1 fL (ref 80.0–100.0)
Monocytes Absolute: 0.6 10*3/uL (ref 0.1–1.0)
Monocytes Relative: 7 %
Neutro Abs: 6 10*3/uL (ref 1.7–7.7)
Neutrophils Relative %: 66 %
Platelets: 249 10*3/uL (ref 150–400)
RBC: 4.38 MIL/uL (ref 3.87–5.11)
RDW: 12.3 % (ref 11.5–15.5)
WBC: 8.9 10*3/uL (ref 4.0–10.5)
nRBC: 0 % (ref 0.0–0.2)

## 2019-06-17 LAB — BASIC METABOLIC PANEL
Anion gap: 12 (ref 5–15)
BUN: 12 mg/dL (ref 8–23)
CO2: 25 mmol/L (ref 22–32)
Calcium: 9.5 mg/dL (ref 8.9–10.3)
Chloride: 97 mmol/L — ABNORMAL LOW (ref 98–111)
Creatinine, Ser: 0.72 mg/dL (ref 0.44–1.00)
GFR calc Af Amer: >60 mL/min (ref >60)
GFR calc non Af Amer: >60 mL/min (ref >60)
Glucose, Bld: 107 mg/dL — ABNORMAL HIGH (ref 70–99)
Potassium: 4.7 mmol/L (ref 3.5–5.1)
Sodium: 134 mmol/L — ABNORMAL LOW (ref 135–145)

## 2019-06-17 NOTE — Patient Instructions (Signed)
Gastritis, Adult Gastritis is inflammation of the stomach. There are two kinds of gastritis:  Acute gastritis. This kind develops suddenly.  Chronic gastritis. This kind is much more common and lasts for a long time. Gastritis happens when the lining of the stomach becomes weak or gets damaged. Without treatment, gastritis can lead to stomach bleeding and ulcers. What are the causes? This condition may be caused by:  An infection.  Drinking too much alcohol.  Certain medicines. These include steroids, antibiotics, and some over-the-counter medicines, such as aspirin or ibuprofen.  Having too much acid in the stomach.  A disease of the intestines or stomach.  Stress.  An allergic reaction.  Crohn's disease.  Some cancer treatments (radiation). Sometimes the cause of this condition is not known. What are the signs or symptoms? Symptoms of this condition include:  Pain or a burning sensation in the upper abdomen.  Nausea.  Vomiting.  An uncomfortable feeling of fullness after eating.  Weight loss.  Bad breath.  Blood in your vomit or stools. In some cases, there are no symptoms. How is this diagnosed? This condition may be diagnosed with:  Your medical history and a description of your symptoms.  A physical exam.  Tests. These can include: ? Blood tests. ? Stool tests. ? A test in which a thin, flexible instrument with a light and a camera is passed down the esophagus and into the stomach (upper endoscopy). ? A test in which a sample of tissue is taken for testing (biopsy). How is this treated? This condition may be treated with medicines. The medicines that are used vary depending on the cause of the gastritis:  If the condition is caused by a bacterial infection, you may be given antibiotic medicines.  If the condition is caused by too much acid in the stomach, you may be given medicines called H2 blockers, proton pump inhibitors, or antacids. Treatment  may also involve stopping the use of certain medicines, such as aspirin, ibuprofen, or other NSAIDs. Follow these instructions at home: Medicines  Take over-the-counter and prescription medicines only as told by your health care provider.  If you were prescribed an antibiotic medicine, take it as told by your health care provider. Do not stop taking the antibiotic even if you start to feel better. Eating and drinking   Eat small, frequent meals instead of large meals.  Avoid foods and drinks that make your symptoms worse.  Drink enough fluid to keep your urine pale yellow. Alcohol use  Do not drink alcohol if: ? Your health care provider tells you not to drink. ? You are pregnant, may be pregnant, or are planning to become pregnant.  If you drink alcohol: ? Limit your use to:  0-1 drink a day for women.  0-2 drinks a day for men. ? Be aware of how much alcohol is in your drink. In the [place] one drink equals one 12 oz bottle of beer (355 mL), one 5 oz glass of wine (148 mL), or one 1 oz glass of hard liquor (44 mL). General instructions  Talk with your health care provider about ways to manage stress, such as getting regular exercise or practicing deep breathing, meditation, or yoga.  Do not use any products that contain nicotine or tobacco, such as cigarettes and e-cigarettes. If you need help quitting, ask your health care provider.  Keep all follow-up visits as told by your health care provider. This is important. Contact a health care provider if:  Your   symptoms get worse.  Your symptoms return after treatment. Get help right away if:  You vomit blood or material that looks like coffee grounds.  You have black or dark red stools.  You are unable to keep fluids down.  Your abdominal pain gets worse.  You have a fever.  You do not feel better after one week. Summary  Gastritis is inflammation of the lining of the stomach that can occur suddenly (acute) or  develop slowly over time (chronic).  This condition is diagnosed with a medical history, a physical exam, or tests.  This condition may be treated with medicines to treat infection or medicines to reduce the amount of acid in your stomach.  Follow your health care provider's instructions about taking medicines, making changes to your diet, and knowing when to call for help. This information is not intended to replace advice given to you by your health care provider. Make sure you discuss any questions you have with your health care provider. Document Revised: 07/29/2017 Document Reviewed: 07/29/2017 Elsevier Patient Education  2020 [employer] Nonspecific Chest Pain, Adult Chest pain can be caused by many different conditions. It can be caused by a condition that is life-threatening and requires treatment right away. It can also be caused by something that is not life-threatening. If you have chest pain, it can be hard to know the difference, so it is important to get help right away to make sure that you do not have a serious condition. Some life-threatening causes of chest pain include:  Heart attack.  A tear in the body's main blood vessel (aortic dissection).  Inflammation around your heart (pericarditis).  A problem in the lungs, such as a blood clot (pulmonary embolism) or a collapsed lung (pneumothorax). Some non life-threatening causes of chest pain include:  Heartburn.  Anxiety or stress.  Damage to the bones, muscles, and cartilage that make up your chest wall.  Pneumonia or bronchitis.  Shingles infection (varicella-zoster virus). Chest pain can feel like:  Pain or discomfort on the surface of your chest or deep in your chest.  Crushing, pressure, aching, or squeezing pain.  Burning or tingling.  Dull or sharp pain that is worse when you move, cough, or take a deep breath.  Pain or discomfort that is also felt in your back, neck, jaw, shoulder, or arm, or pain  that spreads to any of these areas. Your chest pain may come and go. It may also be constant. Your health care provider will do lab tests and other studies to find the cause of your pain. Treatment will depend on the cause of your chest pain. Follow these instructions at home: Medicines  Take over-the-counter and prescription medicines only as told by your health care provider.  If you were prescribed an antibiotic, take it as told by your health care provider. Do not stop taking the antibiotic even if you start to feel better. Lifestyle   Rest as directed by your health care provider.  Do not use any products that contain nicotine or tobacco, such as cigarettes and e-cigarettes. If you need help quitting, ask your health care provider.  Do not drink alcohol.  Make healthy lifestyle choices as recommended. These may include: ? Getting regular exercise. Ask your health care provider to suggest some activities that are safe for you. ? Eating a heart-healthy diet. This includes plenty of fresh fruits and vegetables, whole grains, low-fat (lean) protein, and low-fat dairy products. A dietitian can help you find healthy  eating options. ? Maintaining a healthy weight. ? Managing any other health conditions you have, such as high blood pressure (hypertension) or diabetes. ? Reducing stress, such as with yoga or relaxation techniques. General instructions  Pay attention to any changes in your symptoms. Tell your health care provider about them or any new symptoms.  Avoid any activities that cause chest pain.  Keep all follow-up visits as told by your health care provider. This is important. This includes visits for any further testing if your chest pain does not go away. Contact a health care provider if:  Your chest pain does not go away.  You feel depressed.  You have a fever. Get help right away if:  Your chest pain gets worse.  You have a cough that gets worse, or you cough up  blood.  You have severe pain in your abdomen.  You faint.  You have sudden, unexplained chest discomfort.  You have sudden, unexplained discomfort in your arms, back, neck, or jaw.  You have shortness of breath at any time.  You suddenly start to sweat, or your skin gets clammy.  You feel nausea or you vomit.  You suddenly feel lightheaded or dizzy.  You have severe weakness, or unexplained weakness or fatigue.  Your heart begins to beat quickly, or it feels like it is skipping beats. These symptoms may represent a serious problem that is an emergency. Do not wait to see if the symptoms will go away. Get medical help right away. Call your local emergency services (911 in the [place] Do not drive yourself to the hospital. Summary  Chest pain can be caused by a condition that is serious and requires urgent treatment. It may also be caused by something that is not life-threatening.  If you have chest pain, it is very important to see your health care provider. Your health care provider may do lab tests and other studies to find the cause of your pain.  Follow your health care provider's instructions on taking medicines, making lifestyle changes, and getting emergency treatment if symptoms become worse.  Keep all follow-up visits as told by your health care provider. This includes visits for any further testing if your chest pain does not go away. This information is not intended to replace advice given to you by your health care provider. Make sure you discuss any questions you have with your health care provider. Document Revised: 09/11/2017 Document Reviewed: 09/11/2017 Elsevier Patient Education  2020 [employer].

## 2019-06-17 NOTE — Progress Notes (Signed)
Subjective:    Patient ID: Victoria Morales, female    DOB: November 11, 1955, 64 y.o.   MRN: [id number]  63y/o caucasian female established patient with 2 hour history of chest pain started after abdominal pain and flatulence earlier today. Sometimes radiating into left arm. Tried gas X and tums but didn't help symptoms. Gas X helped flatulence to decrease but didn't help pain.   PSHx cholecystectomy  Denied shortness of breath, headache, diaphoresis, dizziness, palpitations, weakness, fatigue.  Lunch was crackers.  Had normal BM earlier today.  Hasn't seen PCM since annual visit last year.  First time this has happened.  Patient works at [employer] and came to [facility] clinic for evaluation walk in today.     Review of Systems  Constitutional: Negative for activity change, appetite change, chills, diaphoresis, fatigue, fever and unexpected weight change.  HENT: Negative for trouble swallowing and voice change.   Eyes: Negative for photophobia and visual disturbance.  Respiratory: Negative for cough, shortness of breath, wheezing and stridor.   Cardiovascular: Positive for chest pain. Negative for palpitations and leg swelling.  Gastrointestinal: Positive for abdominal pain and nausea. Negative for abdominal distention, anal bleeding, blood in stool, constipation, diarrhea, rectal pain and vomiting.  Endocrine: Negative for cold intolerance and heat intolerance.  Genitourinary: Negative for difficulty urinating.  Musculoskeletal: Positive for myalgias. Negative for arthralgias, back pain, gait problem, joint swelling, neck pain and neck stiffness.  Skin: Negative for rash.  Allergic/Immunologic: Positive for environmental allergies. Negative for food allergies.  Neurological: Negative for dizziness, tremors, seizures, syncope, facial asymmetry, speech difficulty, weakness, light-headedness, numbness and headaches.  Hematological: Negative for adenopathy. Does not bruise/bleed easily.    Psychiatric/Behavioral: Negative for agitation, confusion and sleep disturbance.       Objective:   Physical Exam Vitals reviewed.  Constitutional:      General: She is awake. She is not in acute distress.    Appearance: Normal appearance. She is well-developed, well-groomed and overweight. She is not ill-appearing, toxic-appearing or diaphoretic.  HENT:     Head: Normocephalic and atraumatic.     Jaw: There is normal jaw occlusion.     Salivary Glands: Right salivary gland is not diffusely enlarged or tender. Left salivary gland is not diffusely enlarged or tender.     Right Ear: External ear normal.     Left Ear: External ear normal.     Nose: Nose normal.  Eyes:     General: Lids are normal. Vision grossly intact. Gaze aligned appropriately. Allergic shiner present. No visual field deficit or scleral icterus.    Extraocular Movements: Extraocular movements intact.     Conjunctiva/sclera: Conjunctivae normal.     Pupils: Pupils are equal, round, and reactive to light.  Neck:     Thyroid: No thyromegaly or thyroid tenderness.     Vascular: No carotid bruit.     Trachea: Trachea normal.  Cardiovascular:     Rate and Rhythm: Normal rate and regular rhythm.     Pulses: Normal pulses.          Radial pulses are 2+ on the right side and 2+ on the left side.     Heart sounds: Normal heart sounds, S1 normal and S2 normal. Heart sounds not distant. No murmur. No friction rub. No gallop.      Comments: Trace sock line ankles; repeat BP after initial eval 125/91 pulse 81 her usual last 5 visits over previous 18 months [facility] clinic visits Pulmonary:  Effort: Pulmonary effort is normal. No respiratory distress.     Breath sounds: Normal breath sounds and air entry. No stridor, decreased air movement or transmitted upper airway sounds. No decreased breath sounds, wheezing, rhonchi or rales.     Comments: Wearing cloth mask due to covid 19 pandemic; spoke full sentences without difficulty;  no cough observed in clinic/exam room Chest:     Chest wall: No mass, lacerations, deformity, swelling, tenderness, crepitus or edema.    Abdominal:     General: Abdomen is flat. Bowel sounds are decreased. There is no distension or abdominal bruit.     Palpations: Abdomen is soft. There is no shifting dullness, fluid wave, hepatomegaly, splenomegaly, mass or pulsatile mass.     Tenderness: There is no abdominal tenderness. There is no guarding.     Comments: Hypoactive bowel sounds x 4 quads  Musculoskeletal:        General: No swelling, tenderness, deformity or signs of injury. Normal range of motion.     Right shoulder: Normal.     Left shoulder: Normal.     Right elbow: Normal.     Left elbow: Normal.     Right hand: Normal.     Left hand: Normal.     Cervical back: Normal, normal range of motion and neck supple. No swelling, edema, deformity, erythema, signs of trauma, lacerations, rigidity, spasms, torticollis or crepitus. No pain with movement. Normal range of motion.     Thoracic back: Normal.     Lumbar back: Normal.     Right hip: Normal.     Left hip: Normal.     Right knee: Normal.     Left knee: Normal.     Right lower leg: No edema.     Left lower leg: No edema.     Right ankle: Normal.     Left ankle: Normal.  Lymphadenopathy:     Head:     Right side of head: No submental, submandibular, tonsillar or preauricular adenopathy.     Left side of head: No submental, submandibular, tonsillar or preauricular adenopathy.     Cervical: No cervical adenopathy.     Right cervical: No superficial cervical adenopathy.    Left cervical: No superficial cervical adenopathy.  Skin:    General: Skin is warm and dry.     Capillary Refill: Capillary refill takes less than 2 seconds.     Coloration: Skin is not ashen, cyanotic, jaundiced, mottled, pale or sallow.     Findings: No abrasion, abscess, acne, bruising, burn, ecchymosis, erythema, signs of injury, laceration, lesion,  petechiae, rash or wound.     Nails: There is no clubbing.  Neurological:     General: No focal deficit present.     Mental Status: She is alert and oriented to person, place, and time. Mental status is at baseline.     GCS: GCS eye subscore is 4. GCS verbal subscore is 5. GCS motor subscore is 6.     Cranial Nerves: Cranial nerves are intact. No cranial nerve deficit, dysarthria or facial asymmetry.     Sensory: Sensation is intact. No sensory deficit.     Motor: Motor function is intact. No weakness, tremor, atrophy, abnormal muscle tone or seizure activity.     Coordination: Coordination is intact. Coordination normal.     Gait: Gait is intact. Gait normal.     Comments: Gait sure and steady in clinic; in/out of chair without difficulty; bilateral hand grasp equal 5/5  Psychiatric:  Attention and Perception: Attention and perception normal.        Mood and Affect: Mood and affect normal.        Speech: Speech normal.        Behavior: Behavior normal. Behavior is cooperative.        Thought Content: Thought content normal.        Cognition and Memory: Cognition and memory normal.        Judgment: Judgment normal.           Assessment & Plan:  A-chest pain unspecified, abdomen pain acute and flatulence  P-Contacted PCM unable to see patient in clinic today unable to see her today.  Failed tums po.  Encouraged patient to be seen at [facility] or ER today for EKG and further assessment as indicated.  Discussed with patient these symptoms can be AMI or other abdominal/GI related.  Gallbladder already removed.  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  This clinic does not have xray or EKG capability onsite.  Patient VSS her baseline A&Ox3 BBSCTA.  Patient transporting self to [facility] for further evaluation now.  Discussed they may perform labs and imaging also.  Exitcare handout on chest pain, abdomen pain,  and gastritis.  If shortness of breath, dyspnea, diaphoresis, worsening chest pain, blood black/tarry or bright red in stools/emesis call 911/go to ER  Patient verbalized understanding information/instructions, agreed with plan of care and had no further questions at this time.

## 2019-06-17 NOTE — ED Triage Notes (Signed)
Pt c/o acute onset CP to central and left chest approx 1130 this morning while at work climbing a ladder/walking. States it radiated to left shoulder and had a "lot of gas pass". Denies SOB, n/v, diaphoresis, dizziness or other complaint. Pt states she took gas-x and tums and then it started feeling better.

## 2019-06-17 NOTE — ED Provider Notes (Signed)
[facility]    CSN: [id number] Arrival date & time: 06/17/19  1612      History   Chief Complaint Chief Complaint  Patient presents with  . Chest Pain    HPI Victoria Morales is a 64 y.o. female with history of hyperlipidemia comes to urgent care with complaint of epigastric pain and lower chest pain this morning.  Pain was sharp, severe and was associated with some pressure towards the left shoulder.  No shortness of breath, diaphoresis, nausea or vomiting.  No dizziness, lightheadedness or near syncopal episode.  Patient bought some Tums and Gas-X and drank some soda.  Following that patient had some belching and flatus and that resulted in reduction in the pain.  At the time of presentation in the urgent care the patient did not have any pain.  Patient saw the nurse practitioner and was directed to come to urgent care or ED for further evaluation.  Patient was initially advised to go to the emergency department to get evaluation for chest pain but she wanted to be seen here in the urgent care.  The risks of getting care here given that we have limited resources regarding chest pain evaluation were explained to the patient and she verbalized understanding.   HPI  Past Medical History:  Diagnosis Date  . Acid reflux   . Depression   . Epidermoid cyst 12/09/2015   Excision planned with Dr. Claud Kelp  . Hyperlipemia   . Hypothyroid     Patient Active Problem List   Diagnosis Date Noted  . Prediabetes 07/15/2017  . Essential tremor 07/15/2017  . Murmur 06/23/2015  . GERD (gastroesophageal reflux disease) 05/23/2015  . Hyperlipidemia 05/23/2015  . Hypothyroidism 05/23/2015  . Anxiety and depression 05/23/2015  . BMI 28.0-28.9,adult 05/23/2015    Past Surgical History:  Procedure Laterality Date  . CERVICAL POLYPECTOMY  11/2006  . CHOLECYSTECTOMY  06/2002  . CYST EXCISION  01/05/2016   Posterior neck. Benign epidermoid cyst.   . INNER EAR SURGERY      OB  History   No obstetric history on file.      Home Medications    Prior to Admission medications   Medication Sig Start Date End Date Taking? Authorizing Provider  aspirin EC 81 MG tablet Take 81 mg by mouth daily.   Yes [provider]  calcium carbonate (TUMS - DOSED IN MG ELEMENTAL CALCIUM) 500 MG chewable tablet Chew 1 tablet by mouth daily. Unsure of dose taking prn   Yes [provider]  Calcium Carbonate-Vitamin D 600-200 MG-UNIT CAPS Take 1 tablet by mouth daily.    Yes [provider]  clomiPRAMINE (ANAFRANIL) 50 MG capsule Take 50 mg by mouth at bedtime.   Yes [provider]  fluticasone (FLONASE) 50 MCG/ACT nasal spray SHAKE LIQUID AND USE 1 SPRAY IN EACH NOSTRIL TWICE DAILY 02/16/19  Yes Betancourt, Jarold Song, NP  levothyroxine (SYNTHROID, LEVOTHROID) 100 MCG tablet Take 1.5 tablets (150 mcg total) by mouth daily before breakfast. 07/15/17  Yes Jeffery, Chelle, PA  montelukast (SINGULAIR) 10 MG tablet Take 1 tablet (10 mg total) by mouth at bedtime. 02/16/19 02/16/20 Yes Betancourt, Jarold Song, NP  Multiple Vitamin (MULTIVITAMIN WITH MINERALS) TABS tablet Take 1 tablet by mouth daily.   Yes [provider]  omeprazole (PRILOSEC) 40 MG capsule Take 1 capsule (40 mg total) by mouth daily. 07/15/17  Yes Jeffery, Chelle, PA  PARoxetine (PAXIL) 40 MG tablet Take 40 mg by mouth daily.  Yes [provider]  QUEtiapine (SEROQUEL) 300 MG tablet Take by mouth. 04/10/18  Yes [provider]  simvastatin (ZOCOR) 40 MG tablet Take 1 tablet (40 mg total) by mouth daily. 07/15/17  Yes Jeffery, Avelino Leeds, PA  vitamin C (ASCORBIC ACID) 500 MG tablet Take 500 mg by mouth daily.   Yes [provider]  Misc Natural Products (GLUCOSAMINE CHONDROITIN VIT D3 PO) Take 2 tablets by mouth daily.    [provider]  simethicone (MYLICON) 125 MG chewable tablet Chew 125 mg by mouth every 6 (six) hours as needed for flatulence (unsure of  dose taking prn today).    [provider]  sodium chloride (OCEAN) 0.65 % SOLN nasal spray Place 2 sprays into both nostrils every 2 (two) hours while awake. 02/16/19 03/18/19  Betancourt, Jarold Song, NP    Family History Family History  Problem Relation Age of Onset  . Diabetes Mother   . Heart failure Father        pacemaker  . Diabetes Sister   . Hyperlipidemia Sister   . Hypertension Sister   . Cancer Sister 60       breast cancer  . Breast cancer Sister   . Breast cancer Paternal Aunt 32  . Breast cancer Paternal Aunt 45    Social History Social History   Tobacco Use  . Smoking status: Never Smoker  . Smokeless tobacco: Never Used  Substance Use Topics  . Alcohol use: No    Comment: stopped drinking 2017, "didn't miss it."  . Drug use: No     Allergies   Penicillins   Review of Systems Review of Systems  Constitutional: Negative for activity change, chills, fatigue and fever.  Respiratory: Positive for chest tightness. Negative for cough and shortness of breath.   Cardiovascular: Positive for chest pain. Negative for palpitations.  Gastrointestinal: Positive for nausea. Negative for abdominal pain, blood in stool, rectal pain and vomiting.  Genitourinary: Negative for dysuria, frequency and urgency.  Musculoskeletal: Negative for arthralgias and myalgias.  Neurological: Negative for dizziness, seizures and headaches.  Psychiatric/Behavioral: Negative for confusion and decreased concentration.     Physical Exam Triage Vital Signs ED Triage Vitals  Enc Vitals Group     BP 06/17/19 1648 (!) 152/106     Pulse Rate 06/17/19 1648 82     Resp 06/17/19 1648 18     Temp 06/17/19 1648 98.8 F (37.1 C)     Temp Source 06/17/19 1648 Oral     SpO2 06/17/19 1648 98 %     Weight --      Height --      Head Circumference --      Peak Flow --      Pain Score 06/17/19 1627 5     Pain Loc --      Pain Edu? --      Excl. in GC? --    No data found.   Updated Vital Signs BP (!) 152/106 (BP Location: Right Arm)   Pulse 82   Temp 98.8 F (37.1 C) (Oral)   Resp 18   SpO2 98%   Visual Acuity Right Eye Distance:   Left Eye Distance:   Bilateral Distance:    Right Eye Near:   Left Eye Near:    Bilateral Near:     Physical Exam Vitals and nursing note reviewed.  Constitutional:      General: She is not in acute distress.    Appearance: She is not  ill-appearing.  Cardiovascular:     Rate and Rhythm: Normal rate and regular rhythm.     Heart sounds: Normal heart sounds.  Pulmonary:     Effort: Pulmonary effort is normal. No tachypnea or accessory muscle usage.     Breath sounds: No decreased breath sounds, wheezing, rhonchi or rales.  Chest:     Chest wall: No mass, deformity or tenderness.  Abdominal:     General: Bowel sounds are normal.     Palpations: Abdomen is soft.  Skin:    Capillary Refill: Capillary refill takes less than 2 seconds.  Neurological:     Mental Status: She is alert.      UC Treatments / Results  Labs (all labs ordered are listed, but only abnormal results are displayed) Labs Reviewed - No data to display  EKG   Radiology No results found.  Procedures Procedures (including critical care time)  Medications Ordered in UC Medications - No data to display  Initial Impression / Assessment and Plan / UC Course  I have reviewed the triage vital signs and the nursing notes.  Pertinent labs & imaging results that were available during my care of the patient were reviewed by me and considered in my medical decision making (see chart for details).     1.  Atypical chest pain likely GI in origin: CBC, BMP EKG independently interpreted by me showed normal sinus rhythm with no significant ST/T wave changes. Heart score is less than 3 and less likely cardiac.  Patient is advised to continue taking her gastroesophageal reflux medication and follow-up with primary care physician If symptoms recur  patient is advised to go to the emergency department for chest pain rule out evaluation. Final Clinical Impressions(s) / UC Diagnoses   Final diagnoses:  None   Discharge Instructions   None    ED Prescriptions    None     PDMP not reviewed this encounter.   Chase Picket, MD 06/17/19 [phone]

## 2019-06-17 NOTE — Telephone Encounter (Signed)
Reviewed Epic/[facility] note from Dr Leonides Grills.  CBC normal.  BMET with slightly low sodium/chloride, nonfasting so elevated glucose expected.  EKG SR.  Pain resolved per Dr Leonides Grills note and patient encouraged to follow up with [facility] and continue taking omeprazole.  My chart message sent to patient to drink gatorade or powerade tomorrow/consider adding a little salt to food for a day or two.  Patient may contact me via [email or web address] or [email or web address] if further questions or concerns between now and Monday with RN Rolly Salter return to clinic.  Clinic closed tomorrow at [employer] due to [employer] vacation.

## 2019-06-17 NOTE — ED Triage Notes (Signed)
Pt states she was told to come here for her Chest Pain issues. Per Dr Delton See the Pt needs to be at the ER. After reviewing the Chart the Pt was told to go to the ER Per Delton See for further evaluation.

## 2019-06-18 ENCOUNTER — Encounter: Payer: Self-pay | Admitting: Registered Nurse

## 2019-06-18 NOTE — Telephone Encounter (Signed)
Attempted to reach patient at work extension but each time I dial her extension number call disconnects.  Left message on cell phone calling to check up on her and she can reach me via [email or web address] or [email or web address] and RN Rolly Salter returns on Monday.

## 2019-06-22 ENCOUNTER — Other Ambulatory Visit: Payer: Self-pay

## 2019-06-22 NOTE — Telephone Encounter (Signed)
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  She was very frustrated at urgent care because they wouldn't initially see her and do EKG and she had to ask to speak with doctor.  Everything went well after that but her blood pressure was high.  She has been taking tums regularly 750mg  three times per day along with her omeprazole since then and pain has been controlled.  Discussed with patient that I think that her symptoms are consistent with gastritis/ulcer and I would recommend increasing her omeprazole to twice a day for two weeks and take take tums 750mg  po prn not to exceed 3 grams per day per epoctrates and drink plenty of water.  She stated she needs to schedule her annual physical with PCM also/due.  Last year was telehealth visit and she wants in person this year.  Patient to follow up if new or worsening symptoms occurring for re-evaluation.  Discussed RN in clinic and if she needs refill on omeprazole due to change in dosing to notify .  Patient verbalized understanding information/instructions, agreed with plan of care and had no further questions at this time.

## 2019-07-15 ENCOUNTER — Ambulatory Visit: Payer: Self-pay | Admitting: Registered Nurse

## 2019-07-15 ENCOUNTER — Encounter: Payer: Self-pay | Admitting: Registered Nurse

## 2019-07-15 ENCOUNTER — Other Ambulatory Visit: Payer: Self-pay

## 2019-07-15 DIAGNOSIS — H6121 Impacted cerumen, right ear: Secondary | ICD-10-CM

## 2019-07-15 DIAGNOSIS — H6501 Acute serous otitis media, right ear: Secondary | ICD-10-CM

## 2019-07-15 MED ORDER — CEPHALEXIN 500 MG PO CAPS: 500.0000 mg | ORAL_CAPSULE | Freq: Two times a day (BID) | ORAL | 0 refills | Status: AC

## 2019-07-15 NOTE — Patient Instructions (Signed)
Earwax Buildup, Adult The ears produce a substance called earwax that helps keep bacteria out of the ear and protects the skin in the ear canal. Occasionally, earwax can build up in the ear and cause discomfort or hearing loss. What increases the risk? This condition is more likely to develop in people who:  Are female.  Are elderly.  Naturally produce more earwax.  Clean their ears often with cotton swabs.  Use earplugs often.  Use in-ear headphones often.  Wear hearing aids.  Have narrow ear canals.  Have earwax that is overly thick or sticky.  Have eczema.  Are dehydrated.  Have excess hair in the ear canal. What are the signs or symptoms? Symptoms of this condition include:  Reduced or muffled hearing.  A feeling of fullness in the ear or feeling that the ear is plugged.  Fluid coming from the ear.  Ear pain.  Ear itch.  Ringing in the ear.  Coughing.  An obvious piece of earwax that can be seen inside the ear canal. How is this diagnosed? This condition may be diagnosed based on:  Your symptoms.  Your medical history.  An ear exam. During the exam, your health care provider will look into your ear with an instrument called an otoscope. You may have tests, including a hearing test. How is this treated? This condition may be treated by:  Using ear drops to soften the earwax.  Having the earwax removed by a health care provider. The health care provider may: ? Flush the ear with water. ? Use an instrument that has a loop on the end (curette). ? Use a suction device.  Surgery to remove the wax buildup. This may be done in severe cases. Follow these instructions at home:   Take over-the-counter and prescription medicines only as told by your health care provider.  Do not put any objects, including cotton swabs, into your ear. You can clean the opening of your ear canal with a washcloth or facial tissue.  Follow instructions from your health care  provider about cleaning your ears. Do not over-clean your ears.  Drink enough fluid to keep your urine clear or pale yellow. This will help to thin the earwax.  Keep all follow-up visits as told by your health care provider. If earwax builds up in your ears often or if you use hearing aids, consider seeing your health care provider for routine, preventive ear cleanings. Ask your health care provider how often you should schedule your cleanings.  If you have hearing aids, clean them according to instructions from the manufacturer and your health care provider. Contact a health care provider if:  You have ear pain.  You develop a fever.  You have blood, pus, or other fluid coming from your ear.  You have hearing loss.  You have ringing in your ears that does not go away.  Your symptoms do not improve with treatment.  You feel like the room is spinning (vertigo). Summary  Earwax can build up in the ear and cause discomfort or hearing loss.  The most common symptoms of this condition include reduced or muffled hearing and a feeling of fullness in the ear or feeling that the ear is plugged.  This condition may be diagnosed based on your symptoms, your medical history, and an ear exam.  This condition may be treated by using ear drops to soften the earwax or by having the earwax removed by a health care provider.  Do not put any   objects, including cotton swabs, into your ear. You can clean the opening of your ear canal with a washcloth or facial tissue. This information is not intended to replace advice given to you by your health care provider. Make sure you discuss any questions you have with your health care provider. Document Revised: 02/21/2017 Document Reviewed: 05/22/2016 Elsevier Patient Education  [place]. Otitis Media, Adult  Otitis media occurs when there is inflammation and fluid in the middle ear. Your middle ear is a part of the ear that contains bones for  hearing as well as air that helps send sounds to your brain. What are the causes? This condition is caused by a blockage in the eustachian tube. This tube drains fluid from the ear to the back of the nose (nasopharynx). A blockage in this tube can be caused by an object or by swelling (edema) in the tube. Problems that can cause a blockage include:  A cold or other upper respiratory infection.  Allergies.  An irritant, such as tobacco smoke.  Enlarged adenoids. The adenoids are areas of soft tissue located high in the back of the throat, behind the nose and the roof of the mouth.  A mass in the nasopharynx.  Damage to the ear caused by pressure changes (barotrauma). What are the signs or symptoms? Symptoms of this condition include:  Ear pain.  A fever.  Decreased hearing.  A headache.  Tiredness (lethargy).  Fluid leaking from the ear.  Ringing in the ear. How is this diagnosed? This condition is diagnosed with a physical exam. During the exam your health care provider will use an instrument called an otoscope to look into your ear and check for redness, swelling, and fluid. He or she will also ask about your symptoms. Your health care provider may also order tests, such as:  A test to check the movement of the eardrum (pneumatic otoscopy). This test is done by squeezing a small amount of air into the ear.  A test that changes air pressure in the middle ear to check how well the eardrum moves and whether the eustachian tube is working (tympanogram). How is this treated? This condition usually goes away on its own within 3-5 days. But if the condition is caused by a bacteria infection and does not go away own its own, or keeps coming back, your health care provider may:  Prescribe antibiotic medicines to treat the infection.  Prescribe or recommend medicines to control pain. Follow these instructions at home:  Take over-the-counter and prescription medicines only as told  by your health care provider.  If you were prescribed an antibiotic medicine, take it as told by your health care provider. Do not stop taking the antibiotic even if you start to feel better.  Keep all follow-up visits as told by your health care provider. This is important. Contact a health care provider if:  You have bleeding from your nose.  There is a lump on your neck.  You are not getting better in 5 days.  You feel worse instead of better. Get help right away if:  You have severe pain that is not controlled with medicine.  You have swelling, redness, or pain around your ear.  You have stiffness in your neck.  A part of your face is paralyzed.  The bone behind your ear (mastoid) is tender when you touch it.  You develop a severe headache. Summary  Otitis media is redness, soreness, and swelling of the middle ear.  This condition usually goes away on its own within 3-5 days.  If the problem does not go away in 3-5 days, your health care provider may prescribe or recommend medicines to treat your symptoms.  If you were prescribed an antibiotic medicine, take it as told by your health care provider. This information is not intended to replace advice given to you by your health care provider. Make sure you discuss any questions you have with your health care provider. Document Revised: 02/21/2017 Document Reviewed: 03/01/2016 Elsevier Patient Education  2020 [employer].

## 2019-07-15 NOTE — Progress Notes (Signed)
Subjective:    Patient ID: Victoria Morales, female    DOB: [birth year], 64 y.o.   MRN: [id number]  64y/o established caucasian female here for evaluation right ear pain.  Initially here for RN visit for cerumen impaction/ear irrigation.  After removal cerumen patient noted to have erythematous TM by RN Hildred Alamin and patient booked appt with NP.  Patient reported decreased hearing and ear discomfort right   Typically uses tylenol po prn pain.  Allergic to penicillin.  Patient reported hasn't had repeat episode of chest pain since UC visit.  Currently looking for new [facility] provider that is network for insurance as current provider doesn't accept her insurance.     Review of Systems  Constitutional: Negative for activity change, appetite change, chills, diaphoresis, fatigue, fever and unexpected weight change.  HENT: Positive for ear pain and hearing loss. Negative for ear discharge, facial swelling, trouble swallowing and voice change.   Eyes: Negative for photophobia and visual disturbance.  Respiratory: Negative for cough, chest tightness, shortness of breath and stridor.   Cardiovascular: Negative for chest pain and palpitations.  Gastrointestinal: Negative for diarrhea, nausea and vomiting.  Endocrine: Negative for cold intolerance and heat intolerance.  Genitourinary: Negative for difficulty urinating.  Musculoskeletal: Negative for gait problem, myalgias, neck pain and neck stiffness.  Allergic/Immunologic: Positive for environmental allergies. Negative for food allergies.  Neurological: Negative for dizziness, tremors, seizures, syncope, facial asymmetry, speech difficulty, weakness, light-headedness, numbness and headaches.  Hematological: Negative for adenopathy. Does not bruise/bleed easily.  Psychiatric/Behavioral: Negative for agitation, confusion and sleep disturbance.       Objective:   Physical Exam Vitals and nursing note reviewed.  Constitutional:      General: She is awake. She  is not in acute distress.    Appearance: Normal appearance. She is well-developed, well-groomed and normal weight. She is not ill-appearing, toxic-appearing or diaphoretic.  HENT:     Head: Normocephalic and atraumatic.     Jaw: There is normal jaw occlusion. No trismus.     Salivary Glands: Right salivary gland is not diffusely enlarged or tender. Left salivary gland is not diffusely enlarged or tender.     Right Ear: Hearing and external ear normal. No decreased hearing noted. No laceration, drainage, swelling or tenderness. A middle ear effusion is present. There is impacted cerumen. No foreign body. No mastoid tenderness. No PE tube. No hemotympanum. Tympanic membrane is injected, erythematous and bulging. Tympanic membrane is not perforated.     Left Ear: Hearing, ear canal and external ear normal. No decreased hearing noted. No laceration or tenderness. A middle ear effusion is present. There is no impacted cerumen. No foreign body. No mastoid tenderness. No PE tube. No hemotympanum. Tympanic membrane is bulging. Tympanic membrane is not perforated.     Ears:     Comments: Erythematous injected right TM air fluid level clear bilateral TMs; midpoint right auditory canal with abrasion noted 6 oclock and capillary bleeding; 1cm piece dark brown black cerumen noted in basin at patient side RN Hildred Alamin reported was removed via irrigation with 72ml syringe; bilateral allergic shiners noted    Nose: Nose normal. No nasal deformity, septal deviation, signs of injury, laceration, nasal tenderness, mucosal edema, congestion or rhinorrhea.     Right Sinus: No maxillary sinus tenderness or frontal sinus tenderness.     Left Sinus: No maxillary sinus tenderness or frontal sinus tenderness.     Mouth/Throat:     Mouth: Mucous membranes are not pale, not dry and  not cyanotic.     Pharynx: Oropharynx is clear.     Tonsils: No tonsillar abscesses.  Eyes:     General: Lids are normal. Vision grossly intact. Gaze  aligned appropriately. Allergic shiner present. No visual field deficit or scleral icterus.       Right eye: No foreign body, discharge or hordeolum.        Left eye: No foreign body, discharge or hordeolum.     Extraocular Movements: Extraocular movements intact.     Right eye: Normal extraocular motion and no nystagmus.     Left eye: Normal extraocular motion and no nystagmus.     Conjunctiva/sclera: Conjunctivae normal.     Right eye: Right conjunctiva is not injected. No chemosis, exudate or hemorrhage.    Left eye: Left conjunctiva is not injected. No chemosis, exudate or hemorrhage.    Pupils: Pupils are equal, round, and reactive to light. Pupils are equal.     Right eye: Pupil is round and reactive.     Left eye: Pupil is round and reactive.  Neck:     Thyroid: No thyromegaly.     Trachea: Trachea and phonation normal. No tracheal tenderness or tracheal deviation.  Cardiovascular:     Rate and Rhythm: Normal rate and regular rhythm.     Pulses: Normal pulses.          Radial pulses are 2+ on the right side and 2+ on the left side.  Pulmonary:     Effort: Pulmonary effort is normal. No accessory muscle usage or respiratory distress.     Breath sounds: Normal breath sounds and air entry. No stridor, decreased air movement or transmitted upper airway sounds. No decreased breath sounds, wheezing, rhonchi or rales.     Comments: Wearing cloth mask due to covid 19 pandemic; spoke full sentences without difficulty; no cough observed in exam room Chest:     Chest wall: No tenderness.  Abdominal:     General: There is no distension.     Palpations: Abdomen is soft.  Musculoskeletal:        General: No swelling, tenderness, deformity or signs of injury. Normal range of motion.     Right shoulder: Normal.     Left shoulder: Normal.     Right hand: Normal.     Left hand: Normal.     Cervical back: Normal, normal range of motion and neck supple. No swelling, edema, deformity, erythema,  signs of trauma, lacerations, rigidity, torticollis or crepitus. No pain with movement. Normal range of motion.     Thoracic back: Normal.     Lumbar back: Normal.     Right hip: Normal.     Left hip: Normal.     Right knee: Normal.     Left knee: Normal.     Right lower leg: No edema.     Left lower leg: No edema.  Lymphadenopathy:     Head:     Right side of head: No submental, submandibular, tonsillar, preauricular, posterior auricular or occipital adenopathy.     Left side of head: No submental, submandibular, tonsillar, preauricular, posterior auricular or occipital adenopathy.     Cervical: No cervical adenopathy.     Right cervical: No superficial cervical adenopathy.    Left cervical: No superficial cervical adenopathy.  Skin:    General: Skin is warm and dry.     Capillary Refill: Capillary refill takes less than 2 seconds.     Coloration: Skin is not ashen, cyanotic,  jaundiced, mottled, pale or sallow.     Findings: No abrasion, abscess, acne, bruising, burn, ecchymosis, erythema, signs of injury, laceration, lesion, petechiae, rash or wound.     Nails: There is no clubbing.  Neurological:     General: No focal deficit present.     Mental Status: She is alert and oriented to person, place, and time. Mental status is at baseline. She is not disoriented.     GCS: GCS eye subscore is 4. GCS verbal subscore is 5. GCS motor subscore is 6.     Cranial Nerves: Cranial nerves are intact. No cranial nerve deficit, dysarthria or facial asymmetry.     Sensory: Sensation is intact. No sensory deficit.     Motor: Motor function is intact. No weakness, tremor, atrophy, abnormal muscle tone or seizure activity.     Coordination: Coordination is intact. Coordination normal.     Gait: Gait is intact. Gait normal.     Comments: Gait sure and steady in hallway; on/off exam table and in/out of chair without difficulty; bilateral hand grasp equal  Psychiatric:        Attention and Perception:  Attention and perception normal.        Mood and Affect: Mood and affect normal.        Speech: Speech normal.        Behavior: Behavior normal. Behavior is cooperative.        Thought Content: Thought content normal.        Cognition and Memory: Cognition and memory normal.        Judgment: Judgment normal.           Assessment & Plan:  A-right cerumen impaction, acute right nonrecurrent otitis media  P-Patient reported slight discomfort external ear canal after procedure syringe irrigation by RN Rolly Salter cerumen extraction right ear, canal abrasion noted after cerumen removed and TMs intact.  Patient reported sounds are louder now.  Try 5-10gtts mineral oil external auditory canal daily and may put cotton ball at external ear for 20 minutes after drops instilled.  When in clinic have provider check ears to see if buildup with this method again.  Discussed purpose of earwax with patient.  Avoid cotton applicator (Q-tip) use in ears.  exitcare handout printed and given to patient on cerumen impaction  Patient verbalized understanding, agreed with plan of care and had no further questions at this time.     Supportive treatment. Keflex 500mg  po BID x 10 days #30 RF0 dispensed from [facility] to patient  Tylenol 1000mg  po QID prn pain/fever.   No evidence of invasive bacterial infection, non toxic and well hydrated.  This is most likely self limiting viral infection.  I do not see where any further testing or imaging is necessary at this time.   I will suggest supportive care, rest, good hygiene and encourage the patient to take adequate fluids.  The patient is to return to clinic or EMERGENCY ROOM if symptoms worsen or change significantly e.g. ear pain, fever, purulent discharge from ears or bleeding.  Exitcare handout on otitis media printed and given to patient   Patient verbalized agreement and understanding of treatment plan.

## 2019-08-02 ENCOUNTER — Encounter: Payer: Self-pay | Admitting: Cardiovascular Disease

## 2019-09-28 DIAGNOSIS — J309 Allergic rhinitis, unspecified: Secondary | ICD-10-CM | POA: Insufficient documentation

## 2019-12-23 ENCOUNTER — Ambulatory Visit: Payer: Self-pay | Admitting: Registered Nurse

## 2019-12-23 ENCOUNTER — Other Ambulatory Visit: Payer: Self-pay

## 2019-12-23 VITALS — BP 138/95 | HR 80 | Temp 96.3°F

## 2019-12-23 DIAGNOSIS — J0100 Acute maxillary sinusitis, unspecified: Secondary | ICD-10-CM

## 2019-12-23 DIAGNOSIS — R030 Elevated blood-pressure reading, without diagnosis of hypertension: Secondary | ICD-10-CM

## 2019-12-23 MED ORDER — SALINE SPRAY 0.65 % NA SOLN: 2.0000 | NASAL | 0 refills | Status: DC

## 2019-12-23 MED ORDER — CEPHALEXIN 500 MG PO CAPS: 500.0000 mg | ORAL_CAPSULE | Freq: Two times a day (BID) | ORAL | 0 refills | Status: AC

## 2019-12-23 NOTE — Patient Instructions (Signed)
Sinusitis, Adult Sinusitis is inflammation of your sinuses. Sinuses are hollow spaces in the bones around your face. Your sinuses are located:  Around your eyes.  In the middle of your forehead.  Behind your nose.  In your cheekbones. Mucus normally drains out of your sinuses. When your nasal tissues become inflamed or swollen, mucus can become trapped or blocked. This allows bacteria, viruses, and fungi to grow, which leads to infection. Most infections of the sinuses are caused by a virus. Sinusitis can develop quickly. It can last for up to 4 weeks (acute) or for more than 12 weeks (chronic). Sinusitis often develops after a cold. What are the causes? This condition is caused by anything that creates swelling in the sinuses or stops mucus from draining. This includes:  Allergies.  Asthma.  Infection from bacteria or viruses.  Deformities or blockages in your nose or sinuses.  Abnormal growths in the nose (nasal polyps).  Pollutants, such as chemicals or irritants in the air.  Infection from fungi (rare). What increases the risk? You are more likely to develop this condition if you:  Have a weak body defense system (immune system).  Do a lot of swimming or diving.  Overuse nasal sprays.  Smoke. What are the signs or symptoms? The main symptoms of this condition are pain and a feeling of pressure around the affected sinuses. Other symptoms include:  Stuffy nose or congestion.  Thick drainage from your nose.  Swelling and warmth over the affected sinuses.  Headache.  Upper toothache.  A cough that may get worse at night.  Extra mucus that collects in the throat or the back of the nose (postnasal drip).  Decreased sense of smell and taste.  Fatigue.  A fever.  Sore throat.  Bad breath. How is this diagnosed? This condition is diagnosed based on:  Your symptoms.  Your medical history.  A physical exam.  Tests to find out if your condition is  acute or chronic. This may include: ? Checking your nose for nasal polyps. ? Viewing your sinuses using a device that has a light (endoscope). ? Testing for allergies or bacteria. ? Imaging tests, such as an MRI or CT scan. In rare cases, a bone biopsy may be done to rule out more serious types of fungal sinus disease. How is this treated? Treatment for sinusitis depends on the cause and whether your condition is chronic or acute.  If caused by a virus, your symptoms should go away on their own within 10 days. You may be given medicines to relieve symptoms. They include: ? Medicines that shrink swollen nasal passages (topical intranasal decongestants). ? Medicines that treat allergies (antihistamines). ? A spray that eases inflammation of the nostrils (topical intranasal corticosteroids). ? Rinses that help get rid of thick mucus in your nose (nasal saline washes).  If caused by bacteria, your health care provider may recommend waiting to see if your symptoms improve. Most bacterial infections will get better without antibiotic medicine. You may be given antibiotics if you have: ? A severe infection. ? A weak immune system.  If caused by narrow nasal passages or nasal polyps, you may need to have surgery. Follow these instructions at home: Medicines  Take, use, or apply over-the-counter and prescription medicines only as told by your health care provider. These may include nasal sprays.  If you were prescribed an antibiotic medicine, take it as told by your health care provider. Do not stop taking the antibiotic even if you start   to feel better. Hydrate and humidify   Drink enough fluid to keep your urine pale yellow. Staying hydrated will help to thin your mucus.  Use a cool mist humidifier to keep the humidity level in your home above 50%.  Inhale steam for 10-15 minutes, 3-4 times a day, or as told by your health care provider. You can do this in the bathroom while a hot shower is  running.  Limit your exposure to cool or dry air. Rest  Rest as much as possible.  Sleep with your head raised (elevated).  Make sure you get enough sleep each night. General instructions   Apply a warm, moist washcloth to your face 3-4 times a day or as told by your health care provider. This will help with discomfort.  Wash your hands often with soap and water to reduce your exposure to germs. If soap and water are not available, use hand sanitizer.  Do not smoke. Avoid being around people who are smoking (secondhand smoke).  Keep all follow-up visits as told by your health care provider. This is important. Contact a health care provider if:  You have a fever.  Your symptoms get worse.  Your symptoms do not improve within 10 days. Get help right away if:  You have a severe headache.  You have persistent vomiting.  You have severe pain or swelling around your face or eyes.  You have vision problems.  You develop confusion.  Your neck is stiff.  You have trouble breathing. Summary  Sinusitis is soreness and inflammation of your sinuses. Sinuses are hollow spaces in the bones around your face.  This condition is caused by nasal tissues that become inflamed or swollen. The swelling traps or blocks the flow of mucus. This allows bacteria, viruses, and fungi to grow, which leads to infection.  If you were prescribed an antibiotic medicine, take it as told by your health care provider. Do not stop taking the antibiotic even if you start to feel better.  Keep all follow-up visits as told by your health care provider. This is important. This information is not intended to replace advice given to you by your health care provider. Make sure you discuss any questions you have with your health care provider. Document Revised: 08/11/2017 Document Reviewed: 08/11/2017 Elsevier Patient Education  2020 [employer] How to Perform a Sinus Rinse A sinus rinse is a home  treatment that is used to rinse your sinuses with a sterile mixture of salt and water (saline solution). Sinuses are air-filled spaces in your skull behind the bones of your face and forehead that open into your nasal cavity. A sinus rinse can help to clear mucus, dirt, dust, or pollen from your nasal cavity. You may do a sinus rinse when you have a cold, a virus, nasal allergy symptoms, a sinus infection, or stuffiness in your nose or sinuses. Talk with your health care provider about whether a sinus rinse might help you. What are the risks? A sinus rinse is generally safe and effective. However, there are a few risks, which include:  A burning sensation in your sinuses. This may happen if you do not make the saline solution as directed. Be sure to follow all directions when making the saline solution.  Nasal irritation.  Infection from contaminated water. This is rare, but possible. Do not do a sinus rinse if you have had ear or nasal surgery, ear infection, or blocked ears. Supplies needed:  Saline solution or powder.  Distilled   or sterile water may be needed to mix with saline powder. ? You may use boiled and cooled tap water. Boil tap water for 5 minutes; cool until it is lukewarm. Use within 24 hours. ? Do not use regular tap water to mix with the saline solution.  Neti pot or nasal rinse bottle. These supplies release the saline solution into your nose and through your sinuses. Neti pots and nasal rinse bottles can be purchased at your local pharmacy, a health food store, or online. How to perform a sinus rinse  1. Wash your hands with soap and water. 2. Wash your device according to the directions that came with the product and then dry it. 3. Use the solution that comes with your product or one that is sold separately in stores. Follow the mixing directions on the package if you need to mix with sterile or distilled water. 4. Fill the device with the amount of saline solution noted  in the device instructions. 5. Stand over a sink and tilt your head sideways over the sink. 6. Place the spout of the device in your upper nostril (the one closer to the ceiling). 7. Gently pour or squeeze the saline solution into your nasal cavity. The liquid should drain out from the lower nostril if you are not too congested. 8. While rinsing, breathe through your open mouth. 9. Gently blow your nose to clear any mucus and rinse solution. Blowing too hard may cause ear pain. 10. Repeat in your other nostril. 11. Clean and rinse your device with clean water and then air-dry it. Talk with your health care provider or pharmacist if you have questions about how to do a sinus rinse. Summary  A sinus rinse is a home treatment that is used to rinse your sinuses with a sterile mixture of salt and water (saline solution).  A sinus rinse is generally safe and effective. Follow all instructions carefully.  Before doing a sinus rinse, talk with your health care provider about whether it would be helpful for you. This information is not intended to replace advice given to you by your health care provider. Make sure you discuss any questions you have with your health care provider. Document Revised: 01/06/2017 Document Reviewed: 01/06/2017 Elsevier Patient Education  2020 [employer] Inc.  

## 2019-12-23 NOTE — Progress Notes (Signed)
Subjective:    Patient ID: Victoria Morales, female    DOB: [birth year], 64 y.o.   MRN: [id number]  64y/o Caucasian established female pt reporting a few months of PND. No sore throat, nasal congestion, or rhinorrhea. Started having dental pain yesterday. Took phenylephrine for the drip and throat became slightly dry/scratchy but resolved quickly. Today, with improved but still present upper dental pain and mild pressure behind both eyes. Last treatments for sinus infections: Oct 2018, July 2019, Nov 2020 Keflex At home is taking Claritin, nasal saline spray, Singulair, and Flonase.   Denied known covid contacts or sick contacts.  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  Patient fully covid vaccinated.     Review of Systems  Constitutional: Negative for activity change, appetite change, chills, diaphoresis, fatigue and fever.  HENT: Positive for postnasal drip, sinus pressure and sinus pain. Negative for congestion, dental problem, ear discharge, ear pain, facial swelling, hearing loss, mouth sores, nosebleeds, rhinorrhea, sneezing, sore throat, tinnitus, trouble swallowing and voice change.   Eyes: Negative for photophobia, pain, discharge, redness, itching and visual disturbance.  Respiratory: Negative for cough, shortness of breath, wheezing and stridor.   Cardiovascular: Negative for chest pain.  Gastrointestinal: Negative for diarrhea, nausea and vomiting.  Endocrine: Negative for cold intolerance and heat intolerance.  Genitourinary: Negative for difficulty urinating.  Musculoskeletal: Negative for myalgias.  Allergic/Immunologic: Positive for environmental allergies. Negative for food allergies.  Neurological: Negative for dizziness, seizures, syncope, facial asymmetry, speech difficulty, weakness, light-headedness and numbness.  Hematological: Negative for adenopathy. Does not bruise/bleed easily.  Psychiatric/Behavioral: Negative for agitation, confusion and sleep  disturbance.       Objective:   Physical Exam Vitals and nursing note reviewed.  Constitutional:      General: She is awake. She is not in acute distress.    Appearance: Normal appearance. She is well-developed, well-groomed and overweight. She is not ill-appearing, toxic-appearing or diaphoretic.  HENT:     Head: Normocephalic and atraumatic.     Jaw: There is normal jaw occlusion. No trismus.     Salivary Glands: Right salivary gland is not diffusely enlarged or tender. Left salivary gland is not diffusely enlarged or tender.     Right Ear: Hearing, ear canal and external ear normal. A middle ear effusion is present. There is no impacted cerumen.     Left Ear: Hearing, ear canal and external ear normal. A middle ear effusion is present. There is no impacted cerumen.     Nose: Mucosal edema and rhinorrhea present. No nasal deformity, septal deviation or laceration. Rhinorrhea is clear.     Right Turbinates: Enlarged and swollen. Not pale.     Left Turbinates: Enlarged and swollen. Not pale.     Right Sinus: Maxillary sinus tenderness present. No frontal sinus tenderness.     Left Sinus: Maxillary sinus tenderness present. No frontal sinus tenderness.     Mouth/Throat:     Lips: Pink. No lesions.     Mouth: Mucous membranes are moist. Mucous membranes are not pale, not dry and not cyanotic. No injury, lacerations, oral lesions or angioedema.     Dentition: No dental caries, dental abscesses or gum lesions.     Tongue: No lesions. Tongue does not deviate from midline.     Palate: No mass and lesions.     Pharynx: Uvula midline. Pharyngeal swelling and posterior oropharyngeal erythema present. No oropharyngeal exudate or uvula swelling.     Tonsils: No tonsillar exudate  or tonsillar abscesses. 0 on the right. 0 on the left.     Comments: Cobblestoning posterior pharynx; bilateral allergic shiners; clear discharge bilateral nasal turbinates edema/erythema; bilateral TMs air fluid level  clear Eyes:     General: Lids are normal. Vision grossly intact. Gaze aligned appropriately. Allergic shiner present. No scleral icterus.       Right eye: No foreign body, discharge or hordeolum.        Left eye: No foreign body, discharge or hordeolum.     Extraocular Movements: Extraocular movements intact.     Right eye: Normal extraocular motion and no nystagmus.     Left eye: Normal extraocular motion and no nystagmus.     Conjunctiva/sclera: Conjunctivae normal.     Right eye: Right conjunctiva is not injected. No chemosis, exudate or hemorrhage.    Left eye: Left conjunctiva is not injected. No chemosis, exudate or hemorrhage.    Pupils: Pupils are equal, round, and reactive to light. Pupils are equal.     Right eye: Pupil is round and reactive.     Left eye: Pupil is round and reactive.  Neck:     Thyroid: No thyroid mass or thyromegaly.     Trachea: Trachea and phonation normal. No tracheal tenderness or tracheal deviation.  Cardiovascular:     Rate and Rhythm: Normal rate and regular rhythm.     Pulses: Normal pulses.          Radial pulses are 2+ on the right side and 2+ on the left side.  Pulmonary:     Effort: Pulmonary effort is normal. No accessory muscle usage or respiratory distress.     Breath sounds: Normal breath sounds and air entry. No stridor or transmitted upper airway sounds. No wheezing.  Abdominal:     General: There is no distension.     Palpations: Abdomen is soft.  Musculoskeletal:        General: No tenderness. Normal range of motion.     Right shoulder: Normal. No swelling, deformity, effusion or laceration.     Left shoulder: Normal. No swelling, deformity, effusion or laceration.     Right elbow: Normal. No swelling, deformity or lacerations.     Left elbow: Normal. No swelling, deformity or lacerations.     Right hand: Normal. No swelling, deformity or lacerations.     Left hand: Normal. No swelling, deformity or lacerations.     Cervical back:  Normal, normal range of motion and neck supple. No swelling, edema, deformity, erythema, signs of trauma, lacerations, rigidity, torticollis or crepitus. No pain with movement, spinous process tenderness or muscular tenderness. Normal range of motion.     Thoracic back: Normal. No swelling, edema, deformity or signs of trauma.     Lumbar back: Normal. No swelling, edema, deformity or signs of trauma.     Right hip: Normal. No deformity or lacerations.     Left hip: Normal. No deformity or lacerations.     Right knee: Normal.     Left knee: Normal.  Lymphadenopathy:     Head:     Right side of head: No submental, submandibular, tonsillar, preauricular, posterior auricular or occipital adenopathy.     Left side of head: No submental, submandibular, tonsillar, preauricular, posterior auricular or occipital adenopathy.     Cervical: No cervical adenopathy.     Right cervical: No superficial, deep or posterior cervical adenopathy.    Left cervical: No superficial, deep or posterior cervical adenopathy.  Skin:  General: Skin is warm and dry.     Capillary Refill: Capillary refill takes less than 2 seconds.     Coloration: Skin is not ashen, cyanotic, jaundiced, mottled, pale or sallow.     Findings: No abrasion, abscess, acne, bruising, burn, ecchymosis, erythema, signs of injury, laceration, lesion, petechiae, rash or wound.     Nails: There is no clubbing.  Neurological:     General: No focal deficit present.     Mental Status: She is alert and oriented to person, place, and time. Mental status is at baseline. She is not disoriented.     GCS: GCS eye subscore is 4. GCS verbal subscore is 5. GCS motor subscore is 6.     Cranial Nerves: Cranial nerves are intact. No cranial nerve deficit, dysarthria or facial asymmetry.     Sensory: Sensation is intact. No sensory deficit.     Motor: Motor function is intact. No weakness, tremor, atrophy, abnormal muscle tone or seizure activity.      Coordination: Coordination is intact. Coordination normal.     Gait: Gait is intact. Gait normal.     Comments: Gait sure and steady in clinic; in/out of chair without difficulty; bilateral hand grasp equal 5/5  Psychiatric:        Attention and Perception: Attention and perception normal.        Mood and Affect: Mood and affect normal.        Speech: Speech normal.        Behavior: Behavior normal. Behavior is cooperative.        Thought Content: Thought content normal.        Cognition and Memory: Cognition and memory normal.        Judgment: Judgment normal.           Assessment & Plan:  A-acute nonrecurrent maxillary sinusitis, elevated blood pressure without hypertension  P-Keflex 500mg  po BID x 10 days #30 RF0 dispensed from [facility] to patient.  Increase nasal saline use frequency and if no improvement 48 hours start keflex.  Continue po allergy medications (flonase/singulair/nasal saline/claritin).  Patient refused AVS printout.  Exitcare handout sinusitis and sinus rinse.   flonase 1 spray each nostril BID, saline 2 sprays each nostril q2h wa prn congestion. Denied personal or family history of ENT cancer.  Shower BID especially prior to bed. No evidence of systemic bacterial infection, non toxic and well hydrated.  I do not see where any further testing or imaging is necessary at this time.   I will suggest supportive care, rest, good hygiene and encourage the patient to take adequate fluids.  The patient is to return to clinic or EMERGENCY ROOM if symptoms worsen or change significantly.  Patient verbalized agreement and understanding of treatment plan and had no further questions at this time.   P2:  Hand washing and cover cough  Avoid further caffeine this afternoon.  Probably due to acute sinusitis/pain.  Has tylenol 1000mg  po q6h prn pain at home.  ER if chest pain, worst headache of life, dyspnea or visual changes for re-evaluation.  Will see RN within next 2 weeks for  repeat BP check.   Patient verbalized understanding information/instructions, agreed with plan of care and had no further questions at this time.

## 2020-03-03 ENCOUNTER — Ambulatory Visit: Payer: Self-pay | Admitting: *Deleted

## 2020-03-03 ENCOUNTER — Other Ambulatory Visit: Payer: Self-pay

## 2020-03-03 DIAGNOSIS — J0100 Acute maxillary sinusitis, unspecified: Secondary | ICD-10-CM

## 2020-03-03 NOTE — Progress Notes (Signed)
Pt in to clinic reporting dental pain x5 days which is always her first sx ahead of a sinus infection. Then began having pressure behind her eyes start 3 days ago, L>R. Today started having a mildly productive cough with chest congestion. Denies nasal congestion, rhinorrhea.  Sx very similar to previous sinusitis she was seen for in clinic Sept 2021 and Nov 2020. Treated with Keflex at that those times. Pt has only been using Singulair regularly at home. Advised pt that based on her recent Hx, she would likely benefit from daily use Sept to at least Feb of Claritin, Flonase, Singulair, nasal saline spray. Pt agreeable to this and advises that she will restart all at home today.  Pt denies any other sx including fever/chills, ShOB, n/v/d, otalgia, sore throat, body aches. Mucus is clear still. Pt denies bad taste or smell. Case discussed with NP Inetta Fermo over phone. Orders received for pt to start po allergy meds (Flonase, Claritin, Singulair, nasal saline spray and rinses) and if no improvement in 48 hours, start Keflex 500mg  po BID x10 days #30 RF0 dispensed from [facility].

## 2020-03-06 ENCOUNTER — Telehealth: Payer: Self-pay | Admitting: *Deleted

## 2020-03-06 NOTE — Telephone Encounter (Signed)
Error/ Duplicate

## 2020-03-06 NOTE — Progress Notes (Signed)
Pt called clinic reporting calling out this morning. Sts low grade fever 99.5 this morning. Also started Keflex on Saturday 12/11 after mucus became brown/yellow and dental pain began with worsening pressure behind eyes.  Advised pt that she will need to be out today and tomorrow at least due to needing to have no fever without antipyretics for >24 hours before return. Will discuss case with NP Inetta Fermo regarding testing vs bacterial sinus infection tomorrow 12/14 during clinic and will call pt for additional f/u at that time. Pt agreeable to plan. No further questions/concerns.

## 2020-03-07 NOTE — Progress Notes (Signed)
Pt called clinic asking if she can RTW tomorrow 12/15. No fever since yesterday AM. Nasal congestion, sinus pressure, dental pain all improving. Feels better overall. Discussed with NP Inetta Fermo. Pt approved to RTW tomorrow 12/15.

## 2020-03-16 ENCOUNTER — Encounter: Payer: Self-pay | Admitting: Registered Nurse

## 2020-03-16 ENCOUNTER — Other Ambulatory Visit: Payer: Self-pay

## 2020-03-16 ENCOUNTER — Ambulatory Visit: Payer: Self-pay | Admitting: Registered Nurse

## 2020-03-16 VITALS — BP 163/104 | HR 77 | Temp 97.5°F

## 2020-03-16 DIAGNOSIS — R1031 Right lower quadrant pain: Secondary | ICD-10-CM

## 2020-03-16 DIAGNOSIS — R030 Elevated blood-pressure reading, without diagnosis of hypertension: Secondary | ICD-10-CM

## 2020-03-16 MED ORDER — SALINE SPRAY 0.65 % NA SOLN: 2.0000 | NASAL | 0 refills | Status: DC

## 2020-03-16 MED ORDER — MONTELUKAST SODIUM 10 MG PO TABS: 10.0000 mg | ORAL_TABLET | Freq: Every day | ORAL | 3 refills | Status: DC

## 2020-03-16 MED ORDER — ACETAMINOPHEN 500 MG PO TABS: 1000.0000 mg | ORAL_TABLET | Freq: Four times a day (QID) | ORAL | 0 refills | Status: AC | PRN

## 2020-03-16 NOTE — Patient Instructions (Signed)
Abdominal Bloating When you have abdominal bloating, your abdomen may feel full, tight, or painful. It may also look bigger than normal or swollen (distended). Common causes of abdominal bloating include:  Swallowing air.  Constipation.  Problems digesting food.  Eating too much.  Irritable bowel syndrome. This is a condition that affects the large intestine.  Lactose intolerance. This is an inability to digest lactose, a natural sugar in dairy products.  Celiac disease. This is a condition that affects the ability to digest gluten, a protein found in some grains.  Gastroparesis. This is a condition that slows down the movement of food in the stomach and small intestine. It is more common in people with diabetes mellitus.  Gastroesophageal reflux disease (GERD). This is a digestive condition that makes stomach acid flow back into the esophagus.  Urinary retention. This means that the body is holding onto urine, and the bladder cannot be emptied all the way. Follow these instructions at home: Eating and drinking  Avoid eating too much.  Try not to swallow air while talking or eating.  Avoid eating while lying down.  Avoid these foods and drinks: ? Foods that cause gas, such as broccoli, cabbage, cauliflower, and baked beans. ? Carbonated drinks. ? Hard candy. ? Chewing gum. Medicines  Take over-the-counter and prescription medicines only as told by your health care provider.  Take probiotic medicines. These medicines contain live bacteria or yeasts that can help digestion.  Take coated peppermint oil capsules. Activity  Try to exercise regularly. Exercise may help to relieve bloating that is caused by gas and relieve constipation. General instructions  Keep all follow-up visits as told by your health care provider. This is important. Contact a health care provider if:  You have nausea and vomiting.  You have diarrhea.  You have abdominal pain.  You have unusual  weight loss or weight gain.  You have severe pain, and medicines do not help. Get help right away if:  You have severe chest pain.  You have trouble breathing.  You have shortness of breath.  You have trouble urinating.  You have darker urine than normal.  You have blood in your stools or have dark, tarry stools. Summary  Abdominal bloating means that the abdomen is swollen.  Common causes of abdominal bloating are swallowing air, constipation, and problems digesting food.  Avoid eating too much and avoid swallowing air.  Avoid foods that cause gas, carbonated drinks, hard candy, and chewing gum. This information is not intended to replace advice given to you by your health care provider. Make sure you discuss any questions you have with your health care provider. Document Revised: 06/29/2018 Document Reviewed: 04/12/2016 Elsevier Patient Education  2020 [employer] Gastritis, Adult Gastritis is inflammation of the stomach. There are two kinds of gastritis:  Acute gastritis. This kind develops suddenly.  Chronic gastritis. This kind is much more common and lasts for a long time. Gastritis happens when the lining of the stomach becomes weak or gets damaged. Without treatment, gastritis can lead to stomach bleeding and ulcers. What are the causes? This condition may be caused by:  An infection.  Drinking too much alcohol.  Certain medicines. These include steroids, antibiotics, and some over-the-counter medicines, such as aspirin or ibuprofen.  Having too much acid in the stomach.  A disease of the intestines or stomach.  Stress.  An allergic reaction.  Crohn's disease.  Some cancer treatments (radiation). Sometimes the cause of this condition is not known. What are  the signs or symptoms? Symptoms of this condition include:  Pain or a burning sensation in the upper abdomen.  Nausea.  Vomiting.  An uncomfortable feeling of fullness after  eating.  Weight loss.  Bad breath.  Blood in your vomit or stools. In some cases, there are no symptoms. How is this diagnosed? This condition may be diagnosed with:  Your medical history and a description of your symptoms.  A physical exam.  Tests. These can include: ? Blood tests. ? Stool tests. ? A test in which a thin, flexible instrument with a light and a camera is passed down the esophagus and into the stomach (upper endoscopy). ? A test in which a sample of tissue is taken for testing (biopsy). How is this treated? This condition may be treated with medicines. The medicines that are used vary depending on the cause of the gastritis:  If the condition is caused by a bacterial infection, you may be given antibiotic medicines.  If the condition is caused by too much acid in the stomach, you may be given medicines called H2 blockers, proton pump inhibitors, or antacids. Treatment may also involve stopping the use of certain medicines, such as aspirin, ibuprofen, or other NSAIDs. Follow these instructions at home: Medicines  Take over-the-counter and prescription medicines only as told by your health care provider.  If you were prescribed an antibiotic medicine, take it as told by your health care provider. Do not stop taking the antibiotic even if you start to feel better. Eating and drinking   Eat small, frequent meals instead of large meals.  Avoid foods and drinks that make your symptoms worse.  Drink enough fluid to keep your urine pale yellow. Alcohol use  Do not drink alcohol if: ? Your health care provider tells you not to drink. ? You are pregnant, may be pregnant, or are planning to become pregnant.  If you drink alcohol: ? Limit your use to:  0-1 drink a day for women.  0-2 drinks a day for men. ? Be aware of how much alcohol is in your drink. In the [place] one drink equals one 12 oz bottle of beer (355 mL), one 5 oz glass of wine (148 mL), or one 1  oz glass of hard liquor (44 mL). General instructions  Talk with your health care provider about ways to manage stress, such as getting regular exercise or practicing deep breathing, meditation, or yoga.  Do not use any products that contain nicotine or tobacco, such as cigarettes and e-cigarettes. If you need help quitting, ask your health care provider.  Keep all follow-up visits as told by your health care provider. This is important. Contact a health care provider if:  Your symptoms get worse.  Your symptoms return after treatment. Get help right away if:  You vomit blood or material that looks like coffee grounds.  You have black or dark red stools.  You are unable to keep fluids down.  Your abdominal pain gets worse.  You have a fever.  You do not feel better after one week. Summary  Gastritis is inflammation of the lining of the stomach that can occur suddenly (acute) or develop slowly over time (chronic).  This condition is diagnosed with a medical history, a physical exam, or tests.  This condition may be treated with medicines to treat infection or medicines to reduce the amount of acid in your stomach.  Follow your health care provider's instructions about taking medicines, making changes to your  diet, and knowing when to call for help. This information is not intended to replace advice given to you by your health care provider. Make sure you discuss any questions you have with your health care provider. Document Revised: 07/29/2017 Document Reviewed: 07/29/2017 Elsevier Patient Education  2020 [employer] Diverticulitis  Diverticulitis is infection or inflammation of small pouches (diverticula) in the colon that form due to a condition called diverticulosis. Diverticula can trap stool (feces) and bacteria, causing infection and inflammation. Diverticulitis may cause severe stomach pain and diarrhea. It may lead to tissue damage in the colon that causes  bleeding. The diverticula may also burst (rupture) and cause infected stool to enter other areas of the abdomen. Complications of diverticulitis can include:  Bleeding.  Severe infection.  Severe pain.  Rupture (perforation) of the colon.  Blockage (obstruction) of the colon. What are the causes? This condition is caused by stool becoming trapped in the diverticula, which allows bacteria to grow in the diverticula. This leads to inflammation and infection. What increases the risk? You are more likely to develop this condition if:  You have diverticulosis. The risk for diverticulosis increases if: ? You are overweight or obese. ? You use tobacco products. ? You do not get enough exercise.  You eat a diet that does not include enough fiber. High-fiber foods include fruits, vegetables, beans, nuts, and whole grains. What are the signs or symptoms? Symptoms of this condition may include:  Pain and tenderness in the abdomen. The pain is normally located on the left side of the abdomen, but it may occur in other areas.  Fever and chills.  Bloating.  Cramping.  Nausea.  Vomiting.  Changes in bowel routines.  Blood in your stool. How is this diagnosed? This condition is diagnosed based on:  Your medical history.  A physical exam.  Tests to make sure there is nothing else causing your condition. These tests may include: ? Blood tests. ? Urine tests. ? Imaging tests of the abdomen, including X-rays, ultrasounds, MRIs, or CT scans. How is this treated? Most cases of this condition are mild and can be treated at home. Treatment may include:  Taking over-the-counter pain medicines.  Following a clear liquid diet.  Taking antibiotic medicines by mouth.  Rest. More severe cases may need to be treated at a hospital. Treatment may include:  Not eating or drinking.  Taking prescription pain medicine.  Receiving antibiotic medicines through an IV tube.  Receiving  fluids and nutrition through an IV tube.  Surgery. When your condition is under control, your health care provider may recommend that you have a colonoscopy. This is an exam to look at the entire large intestine. During the exam, a lubricated, bendable tube is inserted into the anus and then passed into the rectum, colon, and other parts of the large intestine. A colonoscopy can show how severe your diverticula are and whether something else may be causing your symptoms. Follow these instructions at home: Medicines  Take over-the-counter and prescription medicines only as told by your health care provider. These include fiber supplements, probiotics, and stool softeners.  If you were prescribed an antibiotic medicine, take it as told by your health care provider. Do not stop taking the antibiotic even if you start to feel better.  Do not drive or use heavy machinery while taking prescription pain medicine. General instructions   Follow a full liquid diet or another diet as directed by your health care provider. After your symptoms improve, your  health care provider may tell you to change your diet. He or she may recommend that you eat a diet that contains at least 25 g (25 grams) of fiber daily. Fiber makes it easier to pass stool. Healthy sources of fiber include: ? Berries. One cup contains 4-8 grams of fiber. ? Beans or lentils. One half cup contains 5-8 grams of fiber. ? Green vegetables. One cup contains 4 grams of fiber.  Exercise for at least 30 minutes, 3 times each week. You should exercise hard enough to raise your heart rate and break a sweat.  Keep all follow-up visits as told by your health care provider. This is important. You may need a colonoscopy. Contact a health care provider if:  Your pain does not improve.  You have a hard time drinking or eating food.  Your bowel movements do not return to normal. Get help right away if:  Your pain gets worse.  Your symptoms do  not get better with treatment.  Your symptoms suddenly get worse.  You have a fever.  You vomit more than one time.  You have stools that are bloody, black, or tarry. Summary  Diverticulitis is infection or inflammation of small pouches (diverticula) in the colon that form due to a condition called diverticulosis. Diverticula can trap stool (feces) and bacteria, causing infection and inflammation.  You are at higher risk for this condition if you have diverticulosis and you eat a diet that does not include enough fiber.  Most cases of this condition are mild and can be treated at home. More severe cases may need to be treated at a hospital.  When your condition is under control, your health care provider may recommend that you have an exam called a colonoscopy. This exam can show how severe your diverticula are and whether something else may be causing your symptoms. This information is not intended to replace advice given to you by your health care provider. Make sure you discuss any questions you have with your health care provider. Document Revised: 02/21/2017 Document Reviewed: 04/13/2016 Elsevier Patient Education  2020 [employer] Inc. Viral Gastroenteritis, Adult  Viral gastroenteritis is also known as the stomach flu. This condition may affect your stomach, small intestine, and large intestine. It can cause sudden watery diarrhea, fever, and vomiting. This condition is caused by many different viruses. These viruses can be passed from person to person very easily (are contagious). Diarrhea and vomiting can make you feel weak and cause you to become dehydrated. You may not be able to keep fluids down. Dehydration can make you tired and thirsty, cause you to have a dry mouth, and decrease how often you urinate. It is important to replace the fluids that you lose from diarrhea and vomiting. What are the causes? Gastroenteritis is caused by many viruses, including rotavirus and norovirus.  Norovirus is the most common cause in adults. You can get sick after being exposed to the viruses from other people. You can also get sick by:  Eating food, drinking water, or touching a surface contaminated with one of these viruses.  Sharing utensils or other personal items with an infected person. What increases the risk? You are more likely to develop this condition if you:  Have a weak body defense system (immune system).  Live with one or more children who are younger than 75 years old.  Live in a nursing home.  Travel on cruise ships. What are the signs or symptoms? Symptoms of this condition start suddenly  1-3 days after exposure to a virus. Symptoms may last for a few days or for as long as a week. Common symptoms include watery diarrhea and vomiting. Other symptoms include:  Fever.  Headache.  Fatigue.  Pain in the abdomen.  Chills.  Weakness.  Nausea.  Muscle aches.  Loss of appetite. How is this diagnosed? This condition is diagnosed with a medical history and physical exam. You may also have a stool test to check for viruses or other infections. How is this treated? This condition typically goes away on its own. The focus of treatment is to prevent dehydration and restore lost fluids (rehydration). This condition may be treated with:  An oral rehydration solution (ORS) to replace important salts and minerals (electrolytes) in your body. Take this if told by your health care provider. This is a drink that is sold at pharmacies and retail stores.  Medicines to help with your symptoms.  Probiotic supplements to reduce symptoms of diarrhea.  Fluids given through an IV, if dehydration is severe. Older adults and people with other diseases or a weak immune system are at higher risk for dehydration. Follow these instructions at home:  Eating and drinking   Take an ORS as told by your health care provider.  Drink clear fluids in small amounts as you are  able. Clear fluids include: ? Water. ? Ice chips. ? Diluted fruit juice. ? Low-calorie sports drinks.  Drink enough fluid to keep your urine pale yellow.  Eat small amounts of healthy foods every 3-4 hours as you are able. This may include whole grains, fruits, vegetables, lean meats, and yogurt.  Avoid fluids that contain a lot of sugar or caffeine, such as energy drinks, sports drinks, and soda.  Avoid spicy or fatty foods.  Avoid alcohol. General instructions  Wash your hands often, especially after having diarrhea or vomiting. If soap and water are not available, use hand sanitizer.  Make sure that all people in your household wash their hands well and often.  Take over-the-counter and prescription medicines only as told by your health care provider.  Rest at home while you recover.  Watch your condition for any changes.  Take a warm bath to relieve any burning or pain from frequent diarrhea episodes.  Keep all follow-up visits as told by your health care provider. This is important. Contact a health care provider if you:  Cannot keep fluids down.  Have symptoms that get worse.  Have new symptoms.  Feel light-headed or dizzy.  Have muscle cramps. Get help right away if you:  Have chest pain.  Feel extremely weak or you faint.  See blood in your vomit.  Have vomit that looks like coffee grounds.  Have bloody or black stools or stools that look like tar.  Have a severe headache, a stiff neck, or both.  Have a rash.  Have severe pain, cramping, or bloating in your abdomen.  Have trouble breathing or you are breathing very quickly.  Have a fast heartbeat.  Have skin that feels cold and clammy.  Feel confused.  Have pain when you urinate.  Have signs of dehydration, such as: ? Dark urine, very little urine, or no urine. ? Cracked lips. ? Dry mouth. ? Sunken eyes. ? Sleepiness. ? Weakness. Summary  Viral gastroenteritis is also known as the  stomach flu. It can cause sudden watery diarrhea, fever, and vomiting.  This condition can be passed from person to person very easily (is contagious).  Take an  ORS if told by your health care provider. This is a drink that is sold at pharmacies and retail stores.  Wash your hands often, especially after having diarrhea or vomiting. If soap and water are not available, use hand sanitizer. This information is not intended to replace advice given to you by your health care provider. Make sure you discuss any questions you have with your health care provider. Document Revised: 08/28/2018 Document Reviewed: 01/14/2018 Elsevier Patient Education  2020 [employer]. Managing Your Hypertension Hypertension is commonly called high blood pressure. This is when the force of your blood pressing against the walls of your arteries is too strong. Arteries are blood vessels that carry blood from your heart throughout your body. Hypertension forces the heart to work harder to pump blood, and may cause the arteries to become narrow or stiff. Having untreated or uncontrolled hypertension can cause heart attack, stroke, kidney disease, and other problems. What are blood pressure readings? A blood pressure reading consists of a higher number over a lower number. Ideally, your blood pressure should be below 120/80. The first ("top") number is called the systolic pressure. It is a measure of the pressure in your arteries as your heart beats. The second ("bottom") number is called the diastolic pressure. It is a measure of the pressure in your arteries as the heart relaxes. What does my blood pressure reading mean? Blood pressure is classified into four stages. Based on your blood pressure reading, your health care provider may use the following stages to determine what type of treatment you need, if any. Systolic pressure and diastolic pressure are measured in a unit called mm Hg. Normal  Systolic pressure: below  120.  Diastolic pressure: below 80. Elevated  Systolic pressure: 120-129.  Diastolic pressure: below 80. Hypertension stage 1  Systolic pressure: 130-139.  Diastolic pressure: 80-89. Hypertension stage 2  Systolic pressure: 140 or above.  Diastolic pressure: 90 or above. What health risks are associated with hypertension? Managing your hypertension is an important responsibility. Uncontrolled hypertension can lead to:  A heart attack.  A stroke.  A weakened blood vessel (aneurysm).  Heart failure.  Kidney damage.  Eye damage.  Metabolic syndrome.  Memory and concentration problems. What changes can I make to manage my hypertension? Hypertension can be managed by making lifestyle changes and possibly by taking medicines. Your health care provider will help you make a plan to bring your blood pressure within a normal range. Eating and drinking   Eat a diet that is high in fiber and potassium, and low in salt (sodium), added sugar, and fat. An example eating plan is called the DASH (Dietary Approaches to Stop Hypertension) diet. To eat this way: ? Eat plenty of fresh fruits and vegetables. Try to fill half of your plate at each meal with fruits and vegetables. ? Eat whole grains, such as whole wheat pasta, brown rice, or whole grain bread. Fill about one quarter of your plate with whole grains. ? Eat low-fat diary products. ? Avoid fatty cuts of meat, processed or cured meats, and poultry with skin. Fill about one quarter of your plate with lean proteins such as fish, chicken without skin, beans, eggs, and tofu. ? Avoid premade and processed foods. These tend to be higher in sodium, added sugar, and fat.  Reduce your daily sodium intake. Most people with hypertension should eat less than 1,500 mg of sodium a day.  Limit alcohol intake to no more than 1 drink a day for nonpregnant  women and 2 drinks a day for men. One drink equals 12 oz of beer, 5 oz of wine, or 1 oz of  hard liquor. Lifestyle  Work with your health care provider to maintain a healthy body weight, or to lose weight. Ask what an ideal weight is for you.  Get at least 30 minutes of exercise that causes your heart to beat faster (aerobic exercise) most days of the week. Activities may include walking, swimming, or biking.  Include exercise to strengthen your muscles (resistance exercise), such as weight lifting, as part of your weekly exercise routine. Try to do these types of exercises for 30 minutes at least 3 days a week.  Do not use any products that contain nicotine or tobacco, such as cigarettes and e-cigarettes. If you need help quitting, ask your health care provider.  Control any long-term (chronic) conditions you have, such as high cholesterol or diabetes. Monitoring  Monitor your blood pressure at home as told by your health care provider. Your personal target blood pressure may vary depending on your medical conditions, your age, and other factors.  Have your blood pressure checked regularly, as often as told by your health care provider. Working with your health care provider  Review all the medicines you take with your health care provider because there may be side effects or interactions.  Talk with your health care provider about your diet, exercise habits, and other lifestyle factors that may be contributing to hypertension.  Visit your health care provider regularly. Your health care provider can help you create and adjust your plan for managing hypertension. Will I need medicine to control my blood pressure? Your health care provider may prescribe medicine if lifestyle changes are not enough to get your blood pressure under control, and if:  Your systolic blood pressure is 130 or higher.  Your diastolic blood pressure is 80 or higher. Take medicines only as told by your health care provider. Follow the directions carefully. Blood pressure medicines must be taken as  prescribed. The medicine does not work as well when you skip doses. Skipping doses also puts you at risk for problems. Contact a health care provider if:  You think you are having a reaction to medicines you have taken.  You have repeated (recurrent) headaches.  You feel dizzy.  You have swelling in your ankles.  You have trouble with your vision. Get help right away if:  You develop a severe headache or confusion.  You have unusual weakness or numbness, or you feel faint.  You have severe pain in your chest or abdomen.  You vomit repeatedly.  You have trouble breathing. Summary  Hypertension is when the force of blood pumping through your arteries is too strong. If this condition is not controlled, it may put you at risk for serious complications.  Your personal target blood pressure may vary depending on your medical conditions, your age, and other factors. For most people, a normal blood pressure is less than 120/80.  Hypertension is managed by lifestyle changes, medicines, or both. Lifestyle changes include weight loss, eating a healthy, low-sodium diet, exercising more, and limiting alcohol. This information is not intended to replace advice given to you by your health care provider. Make sure you discuss any questions you have with your health care provider. Document Revised: 07/03/2018 Document Reviewed: 02/07/2016 Elsevier Patient Education  2020 [employer].

## 2020-03-16 NOTE — Progress Notes (Signed)
Subjective:    Patient ID: Victoria Morales, female    DOB: Jul 23, 1955, 64 y.o.   MRN: [id number]  64y/o Caucasian established female pt c/o bloating and RLQ abd pain radiates to back since 0530 today. Burping more than usual and having more gas than usual.  Denied changes in diet.  Ate breakfast this am. Didn't have stool yesterday typically an every other day her usual pattern.  Two small stools today felt incomplete.  Normal color and consistency.  Last colonoscopy normal per patient denied history diverticulitis/diverticulosis.  Gallbladder removed in the past.  Already on PPI taking every day.  Tried tums and drinking soda today.  Hasn't tried any tylenol.  Putting on seat belt, driving over speed bumps, pressing on abdomen doesn't worsen pain.  Denied fever/chills/n/v/d/dyspnea/shortness of breath.     Review of Systems  Constitutional: Positive for appetite change. Negative for activity change, chills, diaphoresis, fatigue and fever.  HENT: Negative for trouble swallowing and voice change.   Eyes: Negative for photophobia and visual disturbance.  Respiratory: Negative for cough, shortness of breath, wheezing and stridor.   Cardiovascular: Negative for chest pain, palpitations and leg swelling.  Gastrointestinal: Positive for abdominal distention, abdominal pain and constipation. Negative for anal bleeding, blood in stool, diarrhea, nausea, rectal pain and vomiting.  Endocrine: Negative for cold intolerance and heat intolerance.  Genitourinary: Negative for difficulty urinating.  Musculoskeletal: Positive for back pain. Negative for gait problem, joint swelling, myalgias, neck pain and neck stiffness.  Allergic/Immunologic: Positive for environmental allergies. Negative for food allergies.  Neurological: Negative for dizziness, tremors, seizures, syncope, facial asymmetry, speech difficulty, weakness, light-headedness, numbness and headaches.  Hematological: Negative for adenopathy. Does  not bruise/bleed easily.  Psychiatric/Behavioral: Negative for agitation, confusion and sleep disturbance.       Objective:   Physical Exam Vitals and nursing note reviewed.  Constitutional:      General: She is not in acute distress.Vital signs are normal.     Appearance: She is well-developed and well-nourished. She is ill-appearing. She is not toxic-appearing or diaphoretic.  HENT:     Head: Normocephalic and atraumatic.  Eyes:     General: Lids are normal.     Extraocular Movements: EOM normal.     Conjunctiva/sclera: Conjunctivae normal.     Pupils: Pupils are equal, round, and reactive to light.  Neck:     Trachea: Trachea normal.  Cardiovascular:     Rate and Rhythm: Normal rate and regular rhythm.     Pulses: Intact distal pulses.     Heart sounds: Normal heart sounds. No murmur heard. No friction rub. No gallop.   Pulmonary:     Effort: Pulmonary effort is normal. No respiratory distress.     Breath sounds: Normal breath sounds. No stridor. No wheezing, rhonchi or rales.  Abdominal:     General: Abdomen is flat. Bowel sounds are decreased. There is no abdominal bruit. There are no signs of injury.     Palpations: Abdomen is soft. There is no shifting dullness, fluid wave, hepatomegaly, splenomegaly, mass or pulsatile mass.     Tenderness: There is no abdominal tenderness. There is no right CVA tenderness, left CVA tenderness, guarding or rebound. Negative signs include Murphy's sign.     Hernia: No hernia is present. There is no hernia in the umbilical area or ventral area.  Musculoskeletal:        General: Normal range of motion.     Cervical back: Normal range of motion and neck  supple.  Skin:    General: Skin is warm, dry and intact.     Capillary Refill: Capillary refill takes less than 2 seconds.     Coloration: Skin is not cyanotic, jaundiced, mottled or pale.     Findings: No erythema or rash.  Neurological:     General: No focal deficit present.     Mental  Status: She is alert and oriented to person, place, and time.     Cranial Nerves: No cranial nerve deficit.     Motor: No weakness.  Psychiatric:        Mood and Affect: Mood and affect and mood normal. Mood is not anxious or depressed.        Speech: Speech normal.        Behavior: Behavior normal.        Thought Content: Thought content normal.        Cognition and Memory: Cognition and memory normal.        Judgment: Judgment normal.    Patient appears in pain but can jump up and down in exam room and heel drop without any changes in pain.  Patient does deep palpation without any change in abdomen pain.  Standing to sitting to supine and reverse quickly without assist on exam table. Hypoactive bowel sounds x 4 quads, dull to percussion x 4 quads; gait sure and steady in clinic; in/out of chair without difficulty will follow up this afternoon for repeat BP with RN Rolly Salter.  Reviewed Epic care everywhere last colonoscopy 2012 small internal hemorrhoid otherwise normal.  Patient to follow up for repeat in 10 years sooner if new GI symptoms.  Last labs summer 2021 exec panel essentially normal   2014 patient did not return to clinic for BP recheck with RN Rolly Salter prior to clinic closure today.  Clinic closed 24-26 Dec  RN Rolly Salter will repeat BP 27 Dec if patient onsite.     Assessment & Plan:  A-RLQ abdomen pain initial visit, bloating initial visit, elevated blood pressure without hypertension diagnosis  P-On PPI, denied diverticulosis/diverticulitis.  DDx constipation, diverticulitis, gastritis, viral gastroenteritis, appendicitis  Tylenol 1000mg  po q6h prn pain.  May continue tums po prn manufacturer instructions.  Encouraged daily use PPI take on regular schedule.  Discussed viral gastroenteritis is circulating in community.   I have recommended clear fluids and bland diet.  Avoid dairy/spicy, fried and large portions of meat while having nausea.  If vomiting hold po intake x 1 hour.  Then sips  clear fluids like broths, ginger ale, power ade, gatorade, pedialyte may advance to soft/bland if no vomiting x 24 hours and appetite returned otherwise hydration main focus.  Return to the clinic if symptoms persist or worsen; I have alerted the patient to call if high fever, dehydration, marked weakness, fainting, increased abdominal pain, blood in stool or vomit (red or black).  Seek same day re-evaluation with a provider if continually worsening abdomen pain or new bright red or black/coffee ground stools or emesis.  Exitcare handouts printed and given on gastritis, bloating, diverticulitis, and  viral gastroenteritis.  Discussed dehydration, postponing bowel movement when having urge can worsen constipation.   Patient verbalized agreement and understanding of treatment plan and had no further questions at this time.   Acute pain, holiday stressors probable cause of elevated blood pressure.  Sep office visit 138/95 acute sinusitis.  July 2021 [facility] visit 110/74.  Patient given tylenol 1000mg  po may repeat every 6 hours UD x4 from clinic  stock.  Patient going to take dose after leaving clinic prior to returning to work.  Patient to follow up with RN Rolly Salter before clinic closure 4pm for repeat BP check today as will be closed Fri-Sun for holiday observance.  Reopening 20 Mar 2020.  Patient to avoid further caffeine intake today, eat meals on regular schedule/hydrate and go to bathroom when she has urge.  ER if chest pain, dyspnea, visual changes or worst headache of her life.  Exitcare handout on managing hypertension printed and given to patient.  If repeat BP once pain resolved greater than 120/80 recommend follow up with PCM.  Patient verbalized understanding information/instructions, agreed with plan of care and had no further questions at this time.

## 2020-03-17 ENCOUNTER — Other Ambulatory Visit: Payer: Self-pay

## 2020-03-17 ENCOUNTER — Encounter (HOSPITAL_COMMUNITY): Payer: Self-pay | Admitting: Emergency Medicine

## 2020-03-17 ENCOUNTER — Ambulatory Visit (HOSPITAL_COMMUNITY)
Admission: EM | Admit: 2020-03-17 | Discharge: 2020-03-17 | Disposition: A | Discharge status: Home / Self Care | Payer: PRIVATE HEALTH INSURANCE | Attending: Family Medicine | Admitting: Family Medicine

## 2020-03-17 ENCOUNTER — Ambulatory Visit (INDEPENDENT_AMBULATORY_CARE_PROVIDER_SITE_OTHER): Payer: PRIVATE HEALTH INSURANCE

## 2020-03-17 DIAGNOSIS — R1903 Right lower quadrant abdominal swelling, mass and lump: Secondary | ICD-10-CM | POA: Diagnosis not present

## 2020-03-17 DIAGNOSIS — R110 Nausea: Secondary | ICD-10-CM

## 2020-03-17 DIAGNOSIS — R1031 Right lower quadrant pain: Secondary | ICD-10-CM | POA: Diagnosis not present

## 2020-03-17 DIAGNOSIS — K5901 Slow transit constipation: Secondary | ICD-10-CM

## 2020-03-17 DIAGNOSIS — R140 Abdominal distension (gaseous): Secondary | ICD-10-CM

## 2020-03-17 LAB — POCT URINALYSIS DIPSTICK, ED / UC
Bilirubin Urine: NEGATIVE
Glucose, UA: NEGATIVE mg/dL
Ketones, ur: NEGATIVE mg/dL
Leukocytes,Ua: NEGATIVE
Nitrite: NEGATIVE
Protein, ur: NEGATIVE mg/dL
Specific Gravity, Urine: <=1.005 (ref 1.005–1.030)
Urobilinogen, UA: 0.2 mg/dL (ref 0.0–1.0)
pH: 6 (ref 5.0–8.0)

## 2020-03-17 MED ORDER — SENNOSIDES-DOCUSATE SODIUM 8.6-50 MG PO TABS: 1.0000 | ORAL_TABLET | Freq: Every evening | ORAL | 0 refills | Status: AC | PRN

## 2020-03-17 MED ORDER — DICYCLOMINE HCL 20 MG PO TABS: 20.0000 mg | ORAL_TABLET | Freq: Three times a day (TID) | ORAL | 0 refills | Status: DC

## 2020-03-17 MED ORDER — ONDANSETRON 4 MG PO TBDP: 4.0000 mg | ORAL_TABLET | Freq: Three times a day (TID) | ORAL | 0 refills | Status: DC | PRN

## 2020-03-17 MED ORDER — POLYETHYLENE GLYCOL 3350 17 G PO PACK: 17.0000 g | PACK | Freq: Every day | ORAL | 0 refills | Status: AC | PRN

## 2020-03-17 NOTE — ED Triage Notes (Signed)
Pt c/o RLQ abd pain onset yesterday. Pt states she feels kind of nauseous. She states the pain radiates to her back. Pt states she was constipated earlier in the week. She states she had a small bowel movement yesterday.

## 2020-03-17 NOTE — ED Provider Notes (Signed)
[facility]    CSN: [id number] Arrival date & time: 03/17/20  1008      History   Chief Complaint Chief Complaint  Patient presents with  . Abdominal Pain    HPI Victoria Morales is a 64 y.o. female.   HPI  Patient presents for evaluation of abdominal pain, RLQ x 2 day. Medical history significant for IBS and GERD. Reports recurrent episodes of constipation.  Patient is unaware of when she had a regular bowel movement.  Reports yesterday had a bowel movement which was very scant and hard.  Reports nausea and has been unable to drink water as indicated.  Prior to yesterday did not had any symptoms of abdominal pain.  She does not have her gallbladder she does have her appendix.  Denies any fever.  She has had nausea without vomitus.  Unable to characterize the type of pain just reports that the pain is pretty consistent and radiates to the right lower back.  Denies any dysuria.  She was seen by the health at work nurse yesterday and advised to follow-up if symptoms worsen.  Past Medical History:  Diagnosis Date  . Acid reflux   . Depression   . Epidermoid cyst 12/09/2015   Excision planned with Dr. Claud Kelp  . Hyperlipemia   . Hypothyroid     Patient Active Problem List   Diagnosis Date Noted  . Allergic rhinitis 09/28/2019  . Prediabetes 07/15/2017  . Essential tremor 07/15/2017  . Murmur 06/23/2015  . Gastroesophageal reflux disease 05/23/2015  . Hyperlipidemia 05/23/2015  . Hypothyroidism 05/23/2015  . Anxiety and depression 05/23/2015  . BMI 28.0-28.9,adult 05/23/2015    Past Surgical History:  Procedure Laterality Date  . CERVICAL POLYPECTOMY  11/2006  . CHOLECYSTECTOMY  06/2002  . CYST EXCISION  01/05/2016   Posterior neck. Benign epidermoid cyst.   . INNER EAR SURGERY      OB History   No obstetric history on file.      Home Medications    Prior to Admission medications   Medication Sig Start Date End Date Taking? Authorizing Provider   acetaminophen (TYLENOL) 500 MG tablet Take 2 tablets (1,000 mg total) by mouth every 6 (six) hours as needed for up to 3 days for mild pain or moderate pain. 03/16/20 03/19/20  Betancourt, Jarold Song, NP  aspirin EC 81 MG tablet Take 81 mg by mouth daily.    [provider]  calcium carbonate (TUMS - DOSED IN MG ELEMENTAL CALCIUM) 500 MG chewable tablet Chew 1 tablet by mouth daily. Unsure of dose taking prn    [provider]  clomiPRAMINE (ANAFRANIL) 50 MG capsule Take 50 mg by mouth at bedtime.    [provider]  fluticasone (FLONASE) 50 MCG/ACT nasal spray SHAKE LIQUID AND USE 1 SPRAY IN EACH NOSTRIL TWICE DAILY 02/16/19   Betancourt, Jarold Song, NP  levothyroxine (SYNTHROID, LEVOTHROID) 100 MCG tablet Take 1.5 tablets (150 mcg total) by mouth daily before breakfast. 07/15/17   Porfirio Oar, PA  Misc Natural Products (GLUCOSAMINE CHONDROITIN VIT D3 PO) Take 2 tablets by mouth daily.    [provider]  montelukast (SINGULAIR) 10 MG tablet Take 1 tablet (10 mg total) by mouth at bedtime. 03/16/20 03/16/21  Betancourt, Jarold Song, NP  Multiple Vitamin (MULTIVITAMIN WITH MINERALS) TABS tablet Take 1 tablet by mouth daily.    [provider]  omeprazole (PRILOSEC) 40 MG capsule Take 1 capsule (40 mg total) by mouth daily. 07/15/17  Porfirio Oar, PA  PARoxetine (PAXIL) 40 MG tablet Take 40 mg by mouth daily.     [provider]  QUEtiapine (SEROQUEL) 300 MG tablet Take by mouth. 04/10/18   [provider]  simethicone (MYLICON) 125 MG chewable tablet Chew 125 mg by mouth every 6 (six) hours as needed for flatulence (unsure of dose taking prn today).    [provider]  simvastatin (ZOCOR) 40 MG tablet Take 1 tablet (40 mg total) by mouth daily. 07/15/17   Porfirio Oar, PA  sodium chloride (OCEAN) 0.65 % SOLN nasal spray Place 2 sprays into both nostrils every 2 (two) hours while awake. 03/16/20 04/15/20  Betancourt, Jarold Song, NP  vitamin  C (ASCORBIC ACID) 500 MG tablet Take 500 mg by mouth daily.    [provider]    Family History Family History  Problem Relation Age of Onset  . Diabetes Mother   . Heart failure Father        pacemaker  . Diabetes Sister   . Hyperlipidemia Sister   . Hypertension Sister   . Cancer Sister 26       breast cancer  . Breast cancer Sister   . Breast cancer Paternal Aunt 24  . Breast cancer Paternal Aunt 74    Social History Social History   Tobacco Use  . Smoking status: Never Smoker  . Smokeless tobacco: Never Used  Substance Use Topics  . Alcohol use: No    Comment: stopped drinking 2017, "didn't miss it."  . Drug use: No     Allergies   Penicillins   Review of Systems Review of Systems Pertinent negatives listed in HPI  Physical Exam Triage Vital Signs ED Triage Vitals  Enc Vitals Group     BP 03/17/20 1129 (!) 156/103     Pulse Rate 03/17/20 1129 81     Resp 03/17/20 1129 18     Temp 03/17/20 1129 98.4 F (36.9 C)     Temp Source 03/17/20 1129 Oral     SpO2 03/17/20 1129 100 %     Weight --      Height --      Head Circumference --      Peak Flow --      Pain Score 03/17/20 1127 6     Pain Loc --      Pain Edu? --      Excl. in GC? --    No data found.  Updated Vital Signs BP (!) 156/103 (BP Location: Left Arm)   Pulse 81   Temp 98.4 F (36.9 C) (Oral)   Resp 18   SpO2 100%   Visual Acuity Right Eye Distance:   Left Eye Distance:   Bilateral Distance:    Right Eye Near:   Left Eye Near:    Bilateral Near:     Physical Exam   UC Treatments / Results  Labs (all labs ordered are listed, but only abnormal results are displayed) Labs Reviewed  POCT URINALYSIS DIPSTICK, ED / UC - Abnormal; Notable for the following components:      Result Value   Hgb urine dipstick TRACE (*)    All other components within normal limits    EKG   Radiology DG Abdomen 1 View  Result Date: 03/17/2020 CLINICAL DATA:  Right lower  quadrant pain, history of constipation, nausea EXAM: ABDOMEN - 1 VIEW COMPARISON:  None. FINDINGS: The bowel gas pattern is normal. No free air in the abdomen. Moderate burden  of stool in the right hemicolon. No radio-opaque calculi or other significant radiographic abnormality are seen. IMPRESSION: Nonobstructive pattern of bowel gas. Moderate burden of stool in the right hemicolon. No free air in the abdomen supine radiographs. Electronically Signed   By: Lauralyn Primes M.D.   On: 03/17/2020 11:56    Procedures Procedures (including critical care time)  Medications Ordered in UC Medications - No data to display  Initial Impression / Assessment and Plan / UC Course  I have reviewed the triage vital signs and the nursing notes.  Pertinent labs & imaging results that were available during my care of the patient were reviewed by me and considered in my medical decision making (see chart for details).    Advised patient we will treat today for symptoms of abdominal pain likely related to IBS causing constipation and bloating type symptoms.  However patient given strict precautions to follow-up at the emergency department as our exam today is very limited as we are unable to perform a CT of the abdomen.  Patient advised if she is unable to pass a bowel movement with prescribed medication or if her abdominal pain becomes more intense or severe she is to go immediately to the emergency department. Treating abdominal pain with Bentyl patient may take three times daily as needed with food.  For nausea Zofran.  Patient is intolerant of oral intake right now due to nausea therefore advised to start Senokot then transition to MiraLAX until she is able to have a watery loose stool.  Follow-up with primary care provider.  Patient understands red flags and limitations of urgent care visit.  Final Clinical Impressions(s) / UC Diagnoses   Final diagnoses:  Right lower quadrant abdominal pain  Slow transit  constipation  Bloating symptom  Nausea without vomiting     Discharge Instructions     If your symptoms worsen or your abdominal pain becomes more severe I would like for you to go immediately to the emergency department.  Take medications as prescribed.  Would like for you to transition from the oral laxatives to the MiraLAX once you are able to tolerate oral intake of fluids and food as the MiraLAX is better at relieving constipation compared to taking the pill forms of laxatives.  Follow-up with your primary care as needed.    ED Prescriptions    Medication Sig Dispense Auth. Provider   ondansetron (ZOFRAN ODT) 4 MG disintegrating tablet Take 1 tablet (4 mg total) by mouth every 8 (eight) hours as needed for nausea or vomiting. 20 tablet Bing Neighbors, FNP   dicyclomine (BENTYL) 20 MG tablet Take 1 tablet (20 mg total) by mouth 3 (three) times daily before meals. 20 tablet Bing Neighbors, FNP   senna-docusate (SENOKOT-S) 8.6-50 MG tablet Take 1-2 tablets by mouth at bedtime as needed for mild constipation. 60 tablet Bing Neighbors, FNP   polyethylene glycol (MIRALAX) 17 g packet Take 17 g by mouth daily as needed for moderate constipation. Until stool is watery 14 each Bing Neighbors, FNP     PDMP not reviewed this encounter.   Bing Neighbors, FNP 03/17/20 1240

## 2020-03-17 NOTE — Discharge Instructions (Addendum)
If your symptoms worsen or your abdominal pain becomes more severe I would like for you to go immediately to the emergency department.  Take medications as prescribed.  Would like for you to transition from the oral laxatives to the MiraLAX once you are able to tolerate oral intake of fluids and food as the MiraLAX is better at relieving constipation compared to taking the pill forms of laxatives.  Follow-up with your primary care as needed.

## 2020-03-19 ENCOUNTER — Other Ambulatory Visit: Payer: Self-pay

## 2020-03-19 ENCOUNTER — Ambulatory Visit (HOSPITAL_COMMUNITY)
Admission: EM | Admit: 2020-03-19 | Discharge: 2020-03-19 | Disposition: A | Discharge status: Home / Self Care | Payer: PRIVATE HEALTH INSURANCE | Attending: Physician Assistant | Admitting: Physician Assistant

## 2020-03-19 ENCOUNTER — Encounter (HOSPITAL_COMMUNITY): Payer: Self-pay

## 2020-03-19 DIAGNOSIS — K5901 Slow transit constipation: Secondary | ICD-10-CM | POA: Diagnosis not present

## 2020-03-19 MED ORDER — POLYETHYLENE GLYCOL 3350 17 GM/SCOOP PO POWD: ORAL | 0 refills | Status: DC

## 2020-03-19 NOTE — ED Triage Notes (Signed)
Pt presents with constipation X 2 days: pt states she is not having any abdominal pain or any other symptoms.

## 2020-03-19 NOTE — ED Provider Notes (Signed)
[facility]    CSN: [id number] Arrival date & time: 03/19/20  1328      History   Chief Complaint Chief Complaint  Patient presents with  . Constipation    HPI Victoria Morales is a 64 y.o. female.   Patient here c/w constipation x 4 days.  Was seen here 2 days ago, given rx of Miralax, Bentyl, and Senakot , which she has taken w/o relief.  She notes no BM since last visit, however, abdominal pain has resolved.  Denies f/c, n/v/d/c, hematochezia, melena, mucous in stool.     Past Medical History:  Diagnosis Date  . Acid reflux   . Depression   . Epidermoid cyst 12/09/2015   Excision planned with Dr. Claud Kelp  . Hyperlipemia   . Hypothyroid     Patient Active Problem List   Diagnosis Date Noted  . Allergic rhinitis 09/28/2019  . Prediabetes 07/15/2017  . Essential tremor 07/15/2017  . Murmur 06/23/2015  . Gastroesophageal reflux disease 05/23/2015  . Hyperlipidemia 05/23/2015  . Hypothyroidism 05/23/2015  . Anxiety and depression 05/23/2015  . BMI 28.0-28.9,adult 05/23/2015    Past Surgical History:  Procedure Laterality Date  . CERVICAL POLYPECTOMY  11/2006  . CHOLECYSTECTOMY  06/2002  . CYST EXCISION  01/05/2016   Posterior neck. Benign epidermoid cyst.   . INNER EAR SURGERY      OB History   No obstetric history on file.      Home Medications    Prior to Admission medications   Medication Sig Start Date End Date Taking? Authorizing Provider  acetaminophen (TYLENOL) 500 MG tablet Take 2 tablets (1,000 mg total) by mouth every 6 (six) hours as needed for up to 3 days for mild pain or moderate pain. 03/16/20 03/19/20  Betancourt, Jarold Song, NP  aspirin EC 81 MG tablet Take 81 mg by mouth daily.    [provider]  calcium carbonate (TUMS - DOSED IN MG ELEMENTAL CALCIUM) 500 MG chewable tablet Chew 1 tablet by mouth daily. Unsure of dose taking prn    [provider]  clomiPRAMINE (ANAFRANIL) 50 MG capsule Take 50 mg by  mouth at bedtime.    [provider]  dicyclomine (BENTYL) 20 MG tablet Take 1 tablet (20 mg total) by mouth 3 (three) times daily before meals. 03/17/20   Bing Neighbors, FNP  fluticasone (FLONASE) 50 MCG/ACT nasal spray SHAKE LIQUID AND USE 1 SPRAY IN EACH NOSTRIL TWICE DAILY 02/16/19   Betancourt, Jarold Song, NP  levothyroxine (SYNTHROID, LEVOTHROID) 100 MCG tablet Take 1.5 tablets (150 mcg total) by mouth daily before breakfast. 07/15/17   Porfirio Oar, PA  Misc Natural Products (GLUCOSAMINE CHONDROITIN VIT D3 PO) Take 2 tablets by mouth daily.    [provider]  montelukast (SINGULAIR) 10 MG tablet Take 1 tablet (10 mg total) by mouth at bedtime. 03/16/20 03/16/21  Betancourt, Jarold Song, NP  Multiple Vitamin (MULTIVITAMIN WITH MINERALS) TABS tablet Take 1 tablet by mouth daily.    [provider]  omeprazole (PRILOSEC) 40 MG capsule Take 1 capsule (40 mg total) by mouth daily. 07/15/17   Porfirio Oar, PA  ondansetron (ZOFRAN ODT) 4 MG disintegrating tablet Take 1 tablet (4 mg total) by mouth every 8 (eight) hours as needed for nausea or vomiting. 03/17/20   Bing Neighbors, FNP  PARoxetine (PAXIL) 40 MG tablet Take 40 mg by mouth daily.     [provider]  polyethylene glycol (MIRALAX) 17 g packet Take  17 g by mouth daily as needed for moderate constipation. Until stool is watery 03/17/20   Bing Neighbors, FNP  polyethylene glycol powder (MIRALAX) 17 GM/SCOOP powder 1/2 bottle in 32 ounces of Gatorade. 03/19/20   Evern Core, PA-C  QUEtiapine (SEROQUEL) 300 MG tablet Take by mouth. 04/10/18   [provider]  senna-docusate (SENOKOT-S) 8.6-50 MG tablet Take 1-2 tablets by mouth at bedtime as needed for mild constipation. 03/17/20   Bing Neighbors, FNP  simethicone (MYLICON) 125 MG chewable tablet Chew 125 mg by mouth every 6 (six) hours as needed for flatulence (unsure of dose taking prn today).    [provider]  simvastatin  (ZOCOR) 40 MG tablet Take 1 tablet (40 mg total) by mouth daily. 07/15/17   Porfirio Oar, PA  sodium chloride (OCEAN) 0.65 % SOLN nasal spray Place 2 sprays into both nostrils every 2 (two) hours while awake. 03/16/20 04/15/20  Betancourt, Jarold Song, NP  vitamin C (ASCORBIC ACID) 500 MG tablet Take 500 mg by mouth daily.    [provider]    Family History Family History  Problem Relation Age of Onset  . Diabetes Mother   . Heart failure Father        pacemaker  . Diabetes Sister   . Hyperlipidemia Sister   . Hypertension Sister   . Cancer Sister 61       breast cancer  . Breast cancer Sister   . Breast cancer Paternal Aunt 44  . Breast cancer Paternal Aunt 11    Social History Social History   Tobacco Use  . Smoking status: Never Smoker  . Smokeless tobacco: Never Used  Substance Use Topics  . Alcohol use: No    Comment: stopped drinking 2017, "didn't miss it."  . Drug use: No     Allergies   Penicillins   Review of Systems Review of Systems  Constitutional: Negative for chills, fatigue and fever.  HENT: Negative for congestion, ear pain, nosebleeds, postnasal drip, rhinorrhea, sinus pressure, sinus pain and sore throat.   Eyes: Negative for pain and redness.  Respiratory: Negative for cough, shortness of breath and wheezing.   Gastrointestinal: Positive for constipation. Negative for abdominal distention, abdominal pain, blood in stool, diarrhea, nausea and vomiting.  Musculoskeletal: Negative for arthralgias and myalgias.  Skin: Negative for rash.  Neurological: Negative for light-headedness and headaches.  Hematological: Negative for adenopathy. Does not bruise/bleed easily.  Psychiatric/Behavioral: Negative for confusion and sleep disturbance.     Physical Exam Triage Vital Signs ED Triage Vitals  Enc Vitals Group     BP 03/19/20 1417 (!) 153/100     Pulse Rate 03/19/20 1417 100     Resp 03/19/20 1417 17     Temp 03/19/20 1417 99.2 F (37.3 C)      Temp Source 03/19/20 1417 Oral     SpO2 03/19/20 1417 96 %     Weight --      Height --      Head Circumference --      Peak Flow --      Pain Score 03/19/20 1416 0     Pain Loc --      Pain Edu? --      Excl. in GC? --    No data found.  Updated Vital Signs BP (!) 153/100 (BP Location: Left Arm)   Pulse 100   Temp 99.2 F (37.3 C) (Oral)   Resp 17   SpO2 96%   Visual  Acuity Right Eye Distance:   Left Eye Distance:   Bilateral Distance:    Right Eye Near:   Left Eye Near:    Bilateral Near:     Physical Exam Vitals and nursing note reviewed.  Constitutional:      General: She is not in acute distress.    Appearance: Normal appearance. She is not ill-appearing.  HENT:     Head: Normocephalic and atraumatic.     Right Ear: Ear canal normal.  Eyes:     General: No scleral icterus.    Extraocular Movements: Extraocular movements intact.     Conjunctiva/sclera: Conjunctivae normal.  Cardiovascular:     Rate and Rhythm: Normal rate and regular rhythm.     Heart sounds: No murmur heard.   Pulmonary:     Effort: Pulmonary effort is normal. No respiratory distress.     Breath sounds: Normal breath sounds. No wheezing or rales.  Abdominal:     General: Abdomen is flat. There is no distension.     Palpations: Abdomen is soft.     Tenderness: There is no abdominal tenderness. There is no right CVA tenderness or guarding.  Musculoskeletal:     Cervical back: Normal range of motion. No rigidity.  Skin:    Capillary Refill: Capillary refill takes less than 2 seconds.     Coloration: Skin is not jaundiced.     Findings: No rash.  Neurological:     General: No focal deficit present.     Mental Status: She is alert and oriented to person, place, and time.     Motor: No weakness.     Gait: Gait normal.  Psychiatric:        Mood and Affect: Mood normal.        Behavior: Behavior normal.      UC Treatments / Results  Labs (all labs ordered are listed, but  only abnormal results are displayed) Labs Reviewed - No data to display  EKG   Radiology No results found.  Procedures Procedures (including critical care time)  Medications Ordered in UC Medications - No data to display  Initial Impression / Assessment and Plan / UC Course  I have reviewed the triage vital signs and the nursing notes.  Pertinent labs & imaging results that were available during my care of the patient were reviewed by me and considered in my medical decision making (see chart for details).     Patient declined repeat KUB Will try increased dose of miralax Exam benign, no red flag sx presented on exam or in history ED precautions provided. Final Clinical Impressions(s) / UC Diagnoses   Final diagnoses:  Slow transit constipation     Discharge Instructions     Follow up with PCP Go to ED with worsening symptoms. Take 1/2 bottle of miralax in 32 ounce bottle of Gatorade (not red Gatorade) Drink 8 ounces of bottle every 15 minutes    ED Prescriptions    Medication Sig Dispense Auth. Provider   polyethylene glycol powder (MIRALAX) 17 GM/SCOOP powder 1/2 bottle in 32 ounces of Gatorade. 255 g Evern Core, PA-C     PDMP not reviewed this encounter.   Evern Core, PA-C 03/19/20 [phone]

## 2020-03-19 NOTE — Discharge Instructions (Addendum)
Follow up with PCP Go to ED with worsening symptoms. Take 1/2 bottle of miralax in 32 ounce bottle of Gatorade (not red Gatorade) Drink 8 ounces of bottle every 15 minutes

## 2020-03-23 ENCOUNTER — Encounter: Payer: Self-pay | Admitting: Registered Nurse

## 2020-03-23 ENCOUNTER — Other Ambulatory Visit: Payer: Self-pay

## 2020-03-23 ENCOUNTER — Ambulatory Visit: Payer: Self-pay | Admitting: Registered Nurse

## 2020-03-23 VITALS — BP 152/104 | HR 79 | Temp 97.6°F

## 2020-03-23 DIAGNOSIS — R142 Eructation: Secondary | ICD-10-CM | POA: Insufficient documentation

## 2020-03-23 DIAGNOSIS — R030 Elevated blood-pressure reading, without diagnosis of hypertension: Secondary | ICD-10-CM

## 2020-03-23 DIAGNOSIS — K5900 Constipation, unspecified: Secondary | ICD-10-CM | POA: Insufficient documentation

## 2020-03-23 NOTE — Progress Notes (Signed)
Subjective:    Patient ID: Victoria Morales, female    DOB: 1955-05-17, 64 y.o.   MRN: [id number]  64y/o caucasian established female patient here with questions s/p ER/UC visits 12/24 and 12/26 for abdomen pain/constipation.  Patient reported given medication and it helped her to have bowel movements but wondering if she can take medication daily or if she has to switch to something else.  Had unsweet tea and diet mountain dew this am.  Denied chest pain/abdomen pain/visual changes/headache/feeling unwell. Abdomen pain resolved with bowel movement and prn bentyl use and taking miralax daily.  Isolated herself during the holidays since she was having abdomen pain and worried about getting anyone else sick  Next PCM visit in 3 months per patient.  Blood pressure was normal last PCM visit.   Patient reported anxious today and running around at work.     Review of Systems  Constitutional: Negative for activity change, appetite change, chills, diaphoresis, fatigue and fever.  HENT: Negative for trouble swallowing and voice change.   Eyes: Negative for photophobia and visual disturbance.  Respiratory: Negative for cough, shortness of breath, wheezing and stridor.   Cardiovascular: Negative for chest pain, palpitations and leg swelling.  Gastrointestinal: Positive for constipation. Negative for abdominal distention, abdominal pain, anal bleeding, blood in stool, diarrhea, nausea and vomiting.  Endocrine: Negative for cold intolerance and heat intolerance.  Genitourinary: Negative for difficulty urinating.  Musculoskeletal: Negative for gait problem, myalgias, neck pain and neck stiffness.  Skin: Negative for rash.  Allergic/Immunologic: Positive for environmental allergies. Negative for food allergies.  Neurological: Negative for dizziness, tremors, seizures, syncope, facial asymmetry, speech difficulty, weakness, light-headedness, numbness and headaches.  Hematological: Negative for adenopathy. Does  not bruise/bleed easily.  Psychiatric/Behavioral: Negative for agitation, confusion and sleep disturbance.       Objective:   Physical Exam Vitals and nursing note reviewed.  Constitutional:      General: She is awake. She is not in acute distress.    Appearance: Normal appearance. She is well-developed, well-groomed and overweight. She is not ill-appearing, toxic-appearing or diaphoretic.  HENT:     Head: Normocephalic and atraumatic.     Jaw: There is normal jaw occlusion.     Right Ear: Hearing and external ear normal.     Left Ear: Hearing and external ear normal.     Nose: Nose normal.     Mouth/Throat:     Pharynx: Oropharynx is clear.  Eyes:     General: Lids are normal. Vision grossly intact. Gaze aligned appropriately. Allergic shiner present. No visual field deficit or scleral icterus.       Right eye: No discharge.        Left eye: No discharge.     Extraocular Movements: Extraocular movements intact.     Conjunctiva/sclera: Conjunctivae normal.     Pupils: Pupils are equal, round, and reactive to light.  Neck:     Thyroid: No thyromegaly.     Trachea: Trachea and phonation normal.  Cardiovascular:     Rate and Rhythm: Normal rate and regular rhythm.     Pulses: Normal pulses.          Radial pulses are 2+ on the right side and 2+ on the left side.  Pulmonary:     Effort: Pulmonary effort is normal. No respiratory distress.     Breath sounds: Normal breath sounds and air entry. No stridor or transmitted upper airway sounds. No wheezing.     Comments: Spoke full  sentences without difficulty; no cough observed in clinic; wearing mask due to covid 19 pandemic Abdominal:     General: Abdomen is flat.     Palpations: Abdomen is soft. There is no fluid wave.  Musculoskeletal:        General: No signs of injury. Normal range of motion.     Cervical back: Normal range of motion and neck supple. No edema, erythema, signs of trauma, rigidity, torticollis or crepitus. No  pain with movement. Normal range of motion.     Right lower leg: No edema.     Left lower leg: No edema.  Lymphadenopathy:     Cervical: No cervical adenopathy.     Right cervical: No superficial cervical adenopathy.    Left cervical: No superficial cervical adenopathy.  Skin:    General: Skin is warm and dry.     Capillary Refill: Capillary refill takes less than 2 seconds.     Coloration: Skin is not ashen, cyanotic, jaundiced, mottled, pale or sallow.     Findings: No abrasion, abscess, acne, bruising, burn, ecchymosis, erythema, signs of injury, laceration, lesion, petechiae, rash or wound.     Nails: There is no clubbing.  Neurological:     General: No focal deficit present.     Mental Status: She is alert and oriented to person, place, and time. Mental status is at baseline.     GCS: GCS eye subscore is 4. GCS verbal subscore is 5. GCS motor subscore is 6.     Cranial Nerves: Cranial nerves are intact. No cranial nerve deficit, dysarthria or facial asymmetry.     Sensory: Sensation is intact. No sensory deficit.     Motor: Motor function is intact. No weakness, tremor, atrophy, abnormal muscle tone or seizure activity.     Coordination: Coordination is intact. Coordination normal.     Gait: Gait is intact. Gait normal.     Comments: Gait sure and steady in clinic; in/out of chair without difficulty; bilateral hand grasp equal 5/5  Psychiatric:        Attention and Perception: Attention and perception normal.        Mood and Affect: Mood and affect normal.        Speech: Speech normal.        Behavior: Behavior normal. Behavior is cooperative.        Thought Content: Thought content normal.        Cognition and Memory: Cognition and memory normal.        Judgment: Judgment normal.       CLINICAL DATA:  Right lower quadrant pain, history of constipation, nausea  EXAM: ABDOMEN - 1 VIEW  COMPARISON:  None.  FINDINGS: The bowel gas pattern is normal. No free air in  the abdomen. Moderate burden of stool in the right hemicolon. No radio-opaque calculi or other significant radiographic abnormality are seen.  IMPRESSION: Nonobstructive pattern of bowel gas. Moderate burden of stool in the right hemicolon. No free air in the abdomen supine radiographs.   Electronically Signed   By: Lauralyn Primes M.D.   On: 03/17/2020 11:56    Assessment & Plan:  A-constipation unspecified, elevated blood pressure without hypertension diagnosis  P-May take miralax one capful daily to have one soft stool regular.  Ensure drinking water to keep urine pale yellow and voiding every 3-4 hours while awake.  Continue PPI.  Encouraged daily use PPI take on regular schedule.  Discussed KUB showed stool/constipation in ER.  May continue to  use bentyl prn abdomen cramping if needed.  Increase fiber gradually e.g. if currently eating 3 servings fruits and vegetables increase 1 serving per day x 1 week so 4 per day x 7 days then increase to 5 per day the next week.  Discussed dehydration, postponing bowel movement when having urge can worsen constipation. Discussed with patient: High-fiber diet Eat more high-fiber foods, which will help prevent constipation. The best sources of fiber are whole-grain cereals, such as shredded wheat or cereals with bran. Fresh fruit and raw or cooked vegetables, especially asparagus, cabbage, carrots, corn, and broccoli are other good sources of fiber. . Fluids Drink plenty of water. This helps to soften bowel movements so they are easier to pass.  Exercise. May require fiber supplement since she knows she is not getting 4-5 servings fruits/vegetables per day.  Will try to increase fruits/fibers and whole grains in diet. Attempt diet modification. Patient  verbalized agreement and understanding of treatment plan and had no further questions at this time.   Anxiety about bowel constipation/meds, holiday stressors probable cause of elevated blood  pressure.  Sep office visit 138/95 acute sinusitis.  July 2021 [facility] visit 110/74.  Patient scheduled weekly follow up with RN Rolly Salter for BP checks today.  If continue elevated will schedule follow up with PCM sooner and bring BP log with her.  Decrease caffeine intake and increase water intake.  Avoid further caffeine intake today, eat meals on regular schedule/hydrate and go to bathroom when she has urge.  ER if chest pain, dyspnea, visual changes or worst headache of her life.   If repeat BP once pain resolved greater than 120/80 recommend follow up with PCM.  Patient verbalized understanding information/instructions, agreed with plan of care and had no further questions at this time.

## 2020-03-23 NOTE — Patient Instructions (Signed)

## 2020-03-30 ENCOUNTER — Other Ambulatory Visit: Payer: Self-pay

## 2020-03-30 ENCOUNTER — Ambulatory Visit: Payer: Self-pay | Admitting: *Deleted

## 2020-03-30 VITALS — BP 151/97 | HR 81

## 2020-03-30 DIAGNOSIS — R030 Elevated blood-pressure reading, without diagnosis of hypertension: Secondary | ICD-10-CM

## 2020-03-30 NOTE — Progress Notes (Signed)
Routine BP check. Pt has been taking sinus decongestants intermittently for several months. Took yesterday as well.

## 2020-04-27 ENCOUNTER — Other Ambulatory Visit: Payer: Self-pay

## 2020-04-27 ENCOUNTER — Ambulatory Visit: Payer: Self-pay | Admitting: *Deleted

## 2020-04-27 VITALS — BP 129/97 | HR 99

## 2020-04-27 DIAGNOSIS — R030 Elevated blood-pressure reading, without diagnosis of hypertension: Secondary | ICD-10-CM

## 2020-04-27 NOTE — Progress Notes (Signed)
BP check after recovering from sinusitis and use of phenylephrine. No use in 3 weeks. Feels well. Asymptomatic with HTN. BP still elevated. Will do a few weekly BP checks and if remaining high, pt will f/u with her pcp. Appt dates/times sent to pt by email.

## 2020-05-12 ENCOUNTER — Other Ambulatory Visit: Payer: Self-pay

## 2020-05-12 ENCOUNTER — Ambulatory Visit: Payer: Self-pay | Admitting: *Deleted

## 2020-05-12 VITALS — BP 125/88 | HR 85

## 2020-05-12 DIAGNOSIS — R030 Elevated blood-pressure reading, without diagnosis of hypertension: Secondary | ICD-10-CM

## 2020-05-12 NOTE — Progress Notes (Signed)
Routine BP check per pt request. No phenylephrine in past 2 weeks. Managing sinuses with Claritin and nasal saline. No complaints with that regimen.

## 2020-05-25 ENCOUNTER — Encounter: Payer: Self-pay | Admitting: Registered Nurse

## 2020-05-25 ENCOUNTER — Telehealth: Payer: Self-pay | Admitting: Registered Nurse

## 2020-05-25 DIAGNOSIS — E871 Hypo-osmolality and hyponatremia: Secondary | ICD-10-CM

## 2020-05-25 NOTE — Telephone Encounter (Signed)
Hip pain not improving per [employer]  Patient to schedule appt with me for Tuesday 8 Mar if I have not seen her in clinic for this every before.  Reviewed epic and did not see any visits with this diagnosis in the past year.

## 2020-05-28 NOTE — Telephone Encounter (Signed)
Patient contacted via telephone and stated she hasn't had any hip problems but she has been having episodes when driving home from work feeling like she is choking in car.  "Like a gas bubble, burps but then reoccurs"  Denied changes in stool, emesis, regurgitation.  Some post nasal drip.  Denied cough/wheezing/cold/URI symptoms/sore throat. She is worried about throat cancer. PMHx GERD/SAR  Patient reported both parents smoked at home when she was growing up.  She has never smoked.  She stated she will stop in clinic on Tuesday for me to listen to lungs and look in throat.  Patient due annual labs April for [facility].  Discussed if thyroid enlarged could cause breathing/swallowing symptoms.  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  She is taking her meds, not driving with windows down or vent only.  Discussed RN Rolly Salter will get with her to schedule annual labs fasting and she is to see me on Tuesday.  Patient verbalized understanding information instructions, agreed with plan of care and had no further questions at this time.  Patient A&Ox3 spoke full sentences without difficulty, no cough/throat clearing/nasal sniffing or congestion during 6 minute telephone call.

## 2020-05-29 NOTE — Telephone Encounter (Signed)
Fasting labs scheduled Fri 06/02/20 at 1000.

## 2020-05-30 NOTE — Telephone Encounter (Signed)
Noted labs scheduled 

## 2020-06-01 ENCOUNTER — Other Ambulatory Visit: Payer: Self-pay

## 2020-06-01 ENCOUNTER — Encounter: Payer: Self-pay | Admitting: Registered Nurse

## 2020-06-01 ENCOUNTER — Ambulatory Visit: Payer: Self-pay | Admitting: Registered Nurse

## 2020-06-01 VITALS — BP 131/98 | HR 88 | Temp 98.6°F

## 2020-06-01 DIAGNOSIS — E039 Hypothyroidism, unspecified: Secondary | ICD-10-CM

## 2020-06-01 DIAGNOSIS — K219 Gastro-esophageal reflux disease without esophagitis: Secondary | ICD-10-CM

## 2020-06-01 DIAGNOSIS — R030 Elevated blood-pressure reading, without diagnosis of hypertension: Secondary | ICD-10-CM

## 2020-06-01 DIAGNOSIS — J301 Allergic rhinitis due to pollen: Secondary | ICD-10-CM

## 2020-06-01 DIAGNOSIS — Z1211 Encounter for screening for malignant neoplasm of colon: Secondary | ICD-10-CM

## 2020-06-01 MED ORDER — SALINE SPRAY 0.65 % NA SOLN: 2.0000 | NASAL | 0 refills | Status: DC

## 2020-06-01 NOTE — Telephone Encounter (Signed)
See office visit note today.  Labs still pending draw but are scheduled.

## 2020-06-01 NOTE — Patient Instructions (Signed)
Bradley's Neurology in Clinical Practice (8th ed., pp. [phone]). [place], [place]: [employer]."> [employer] of [employer] (21st ed., pp. [phone]). [place], [place]: [employer]>   Allergic Rhinitis, Adult  Allergic rhinitis is an allergic reaction that affects the mucous membrane inside the nose. The mucous membrane is the tissue that produces mucus. There are two types of allergic rhinitis:  Seasonal. This type is also called hay fever and happens only during certain seasons.  Perennial. This type can happen at any time of the year. Allergic rhinitis cannot be spread from person to person. This condition can be mild, moderate, or severe. It can develop at any age and may be outgrown. What are the causes? This condition is caused by allergens. These are things that can cause an allergic reaction. Allergens may differ for seasonal allergic rhinitis and perennial allergic rhinitis.  Seasonal allergic rhinitis is triggered by pollen. Pollen can come from grasses, trees, and weeds.  Perennial allergic rhinitis may be triggered by: ? Dust mites. ? Proteins in a pet's urine, saliva, or dander. Dander is dead skin cells from a pet. ? Smoke, mold, or car fumes. What increases the risk? You are more likely to develop this condition if you have a family history of allergies or other conditions related to allergies, including:  Allergic conjunctivitis. This is inflammation of parts of the eyes and eyelids.  Asthma. This condition affects the lungs and makes it hard to breathe.  Atopic dermatitis or eczema. This is long term (chronic) inflammation of the skin.  Food allergies. What are the signs or symptoms? Symptoms of this condition include:  Sneezing or coughing.  A stuffy nose (nasal congestion), itchy nose, or nasal discharge.  Itchy eyes and tearing of the eyes.  A feeling of mucus dripping down the back of your throat (postnasal drip).  Trouble sleeping.  Tiredness or  fatigue.  Headache.  Sore throat. How is this diagnosed? This condition may be diagnosed with your symptoms, medical history, and physical exam. Your health care provider may check for related conditions, such as:  Asthma.  Pink eye. This is eye inflammation caused by infection (conjunctivitis).  Ear infection.  Upper respiratory infection. This is an infection in the nose, throat, or upper airways. You may also have tests to find out which allergens trigger your symptoms. These may include skin tests or blood tests. How is this treated? There is no cure for this condition, but treatment can help control symptoms. Treatment may include:  Taking medicines that block allergy symptoms, such as corticosteroids and antihistamines. Medicine may be given as a shot, nasal spray, or pill.  Avoiding any allergens.  Being exposed again and again to tiny amounts of allergens to help you build a defense against allergens (immunotherapy). This is done if other treatments have not helped. It may include: ? Allergy shots. These are injected medicines that have small amounts of allergen in them. ? Sublingual immunotherapy. This involves taking small doses of a medicine with allergen in it under your tongue. If these treatments do not work, your health care provider may prescribe newer, stronger medicines. Follow these instructions at home: Avoiding allergens Find out what you are allergic to and avoid those allergens. These are some things you can do to help avoid allergens:  If you have perennial allergies: ? Replace carpet with wood, tile, or vinyl flooring. Carpet can trap dander and dust. ? Do not smoke. Do not allow smoking in your home. ? Change your heating and air conditioning  filters at least once a month.  If you have seasonal allergies, take these steps during allergy season: ? Keep windows closed as much as possible. ? Plan outdoor activities when pollen counts are lowest. Check  pollen counts before you plan outdoor activities. ? When coming indoors, change clothing and shower before sitting on furniture or bedding.  If you have a pet in the house that produces allergens: ? Keep the pet out of the bedroom. ? Vacuum, sweep, and dust regularly. General instructions  Take over-the-counter and prescription medicines only as told by your health care provider.  Drink enough fluid to keep your urine pale yellow.  Keep all follow-up visits as told by your health care provider. This is important. Where to find more information  American Academy of Allergy, Asthma & Immunology: www.aaaai.org Contact a health care provider if:  You have a fever.  You develop a cough that does not go away.  You make whistling sounds when you breathe (wheeze).  Your symptoms slow you down or stop you from doing your normal activities each day. Get help right away if:  You have shortness of breath. This symptom may represent a serious problem that is an emergency. Do not wait to see if the symptom will go away. Get medical help right away. Call your local emergency services (911 in the [place] Do not drive yourself to the hospital. Summary  Allergic rhinitis may be managed by taking medicines as directed and avoiding allergens.  If you have seasonal allergies, keep windows closed as much as possible during allergy season.  Contact your health care provider if you develop a fever or a cough that does not go away. This information is not intended to replace advice given to you by your health care provider. Make sure you discuss any questions you have with your health care provider. Document Revised: 04/30/2019 Document Reviewed: 03/09/2019 Else[employer] Patient Education  2021 [employer] Gastroesophageal Reflux Disease, Adult Gastroesophageal reflux (GER) happens when acid from the stomach flows up into the tube that connects the mouth and the stomach (esophagus). Normally, food  travels down the esophagus and stays in the stomach to be digested. However, when a person has GER, food and stomach acid sometimes move back up into the esophagus. If this becomes a more serious problem, the person may be diagnosed with a disease called gastroesophageal reflux disease (GERD). GERD occurs when the reflux:  Happens often.  Causes frequent or severe symptoms.  Causes problems such as damage to the esophagus. When stomach acid comes in contact with the esophagus, the acid may cause inflammation in the esophagus. Over time, GERD may create small holes (ulcers) in the lining of the esophagus. What are the causes? This condition is caused by a problem with the muscle between the esophagus and the stomach (lower esophageal sphincter, or LES). Normally, the LES muscle closes after food passes through the esophagus to the stomach. When the LES is weakened or abnormal, it does not close properly, and that allows food and stomach acid to go back up into the esophagus. The LES can be weakened by certain dietary substances, medicines, and medical conditions, including:  Tobacco use.  Pregnancy.  Having a hiatal hernia.  Alcohol use.  Certain foods and beverages, such as coffee, chocolate, onions, and peppermint. What increases the risk? You are more likely to develop this condition if you:  Have an increased body weight.  Have a connective tissue disorder.  Take NSAIDs, such as ibuprofen. What  are the signs or symptoms? Symptoms of this condition include:  Heartburn.  Difficult or painful swallowing and the feeling of having a lump in the throat.  A bitter taste in the mouth.  Bad breath and having a large amount of saliva.  Having an upset or bloated stomach and belching.  Chest pain. Different conditions can cause chest pain. Make sure you see your health care provider if you experience chest pain.  Shortness of breath or wheezing.  Ongoing (chronic) cough or a  nighttime cough.  Wearing away of tooth enamel.  Weight loss. How is this diagnosed? This condition may be diagnosed based on a medical history and a physical exam. To determine if you have mild or severe GERD, your health care provider may also monitor how you respond to treatment. You may also have tests, including:  A test to examine your stomach and esophagus with a small camera (endoscopy).  A test that measures the acidity level in your esophagus.  A test that measures how much pressure is on your esophagus.  A barium swallow or modified barium swallow test to show the shape, size, and functioning of your esophagus. How is this treated? Treatment for this condition may vary depending on how severe your symptoms are. Your health care provider may recommend:  Changes to your diet.  Medicine.  Surgery. The goal of treatment is to help relieve your symptoms and to prevent complications. Follow these instructions at home: Eating and drinking  Follow a diet as recommended by your health care provider. This may involve avoiding foods and drinks such as: ? Coffee and tea, with or without caffeine. ? Drinks that contain alcohol. ? Energy drinks and sports drinks. ? Carbonated drinks or sodas. ? Chocolate and cocoa. ? Peppermint and mint flavorings. ? Garlic and onions. ? Horseradish. ? Spicy and acidic foods, including peppers, chili powder, curry powder, vinegar, hot sauces, and barbecue sauce. ? Citrus fruit juices and citrus fruits, such as oranges, lemons, and limes. ? Tomato-based foods, such as red sauce, chili, salsa, and pizza with red sauce. ? Fried and fatty foods, such as donuts, french fries, potato chips, and high-fat dressings. ? High-fat meats, such as hot dogs and fatty cuts of red and white meats, such as rib eye steak, sausage, ham, and bacon. ? High-fat dairy items, such as whole milk, butter, and cream cheese.  Eat small, frequent meals instead of large  meals.  Avoid drinking large amounts of liquid with your meals.  Avoid eating meals during the 2-3 hours before bedtime.  Avoid lying down right after you eat.  Do not exercise right after you eat.   Lifestyle  Do not use any products that contain nicotine or tobacco. These products include cigarettes, chewing tobacco, and vaping devices, such as e-cigarettes. If you need help quitting, ask your health care provider.  Try to reduce your stress by using methods such as yoga or meditation. If you need help reducing stress, ask your health care provider.  If you are overweight, reduce your weight to an amount that is healthy for you. Ask your health care provider for guidance about a safe weight loss goal.   General instructions  Pay attention to any changes in your symptoms.  Take over-the-counter and prescription medicines only as told by your health care provider. Do not take aspirin, ibuprofen, or other NSAIDs unless your health care provider told you to take these medicines.  Wear loose-fitting clothing. Do not wear anything tight around  your waist that causes pressure on your abdomen.  Raise (elevate) the head of your bed about 6 inches (15 cm). You can use a wedge to do this.  Avoid bending over if this makes your symptoms worse.  Keep all follow-up visits. This is important. Contact a health care provider if:  You have: ? New symptoms. ? Unexplained weight loss. ? Difficulty swallowing or it hurts to swallow. ? Wheezing or a persistent cough. ? A hoarse voice.  Your symptoms do not improve with treatment. Get help right away if:  You have sudden pain in your arms, neck, jaw, teeth, or back.  You suddenly feel sweaty, dizzy, or light-headed.  You have chest pain or shortness of breath.  You vomit and the vomit is green, yellow, or black, or it looks like blood or coffee grounds.  You faint.  You have stool that is red, bloody, or black.  You cannot swallow,  drink, or eat. These symptoms may represent a serious problem that is an emergency. Do not wait to see if the symptoms will go away. Get medical help right away. Call your local emergency services (911 in the [place] Do not drive yourself to the hospital. Summary  Gastroesophageal reflux happens when acid from the stomach flows up into the esophagus. GERD is a disease in which the reflux happens often, causes frequent or severe symptoms, or causes problems such as damage to the esophagus.  Treatment for this condition may vary depending on how severe your symptoms are. Your health care provider may recommend diet and lifestyle changes, medicine, or surgery.  Contact a health care provider if you have new or worsening symptoms.  Take over-the-counter and prescription medicines only as told by your health care provider. Do not take aspirin, ibuprofen, or other NSAIDs unless your health care provider told you to do so.  Keep all follow-up visits as told by your health care provider. This is important. This information is not intended to replace advice given to you by your health care provider. Make sure you discuss any questions you have with your health care provider. Document Revised: 09/20/2019 Document Reviewed: 09/20/2019 [employer] Patient Education  2021 [employer]

## 2020-06-01 NOTE — Progress Notes (Signed)
Subjective:    Patient ID: Victoria Morales, female    DOB: 04/02/1955, 65 y.o.   MRN: [id number]  64y/o Caucasian established female pt c/o choking sensation intermittently over past week. Reports it worsens throughout the day. Worse when lying down at night. Denies sensation when eating or swallowing. Taking Omeprazole 40mg  daily, Tums prn. Has increased Tums use during past week thinking GERD causing symptoms but not feeling like acid rising up throat or into back of mouth.  Denied food or liquids getting stuck.  Has noticed when driving home after work sometimes coughing/choking sensation also.  Her partner has told her she has been coughing more lately.  Has noticed some post nasal drip/allergy flare.  Taking her medications as prescribed.  Has lab draw scheduled for annual Be Well.  Showers when she gets home from work.  Takes omeprazole and levothyroxine prior to breakfast together. PMHx hypothyroidism, seasonal allergic rhinitis. Denied fever/chills/illness/changes in stools.  Patient feels like has gas bubble at times sometimes burps.  Denied n/v/d.  Patient reported due for routine colonoscopy also may talk to GI about GERD also.  Has noticed weight gain during covid pandemic.     Review of Systems  Constitutional: Negative for activity change, appetite change, chills, diaphoresis, fatigue, fever and unexpected weight change.  HENT: Positive for postnasal drip and sore throat. Negative for congestion, dental problem, drooling, ear discharge, ear pain, facial swelling, hearing loss, mouth sores, nosebleeds, sinus pressure, sinus pain, sneezing, tinnitus, trouble swallowing and voice change.   Eyes: Negative for photophobia and visual disturbance.  Respiratory: Positive for cough and choking. Negative for chest tightness, shortness of breath and stridor.   Cardiovascular: Negative for chest pain.  Gastrointestinal: Negative for abdominal pain, diarrhea, nausea and vomiting.  Endocrine:  Negative for cold intolerance and heat intolerance.  Genitourinary: Negative for difficulty urinating.  Musculoskeletal: Negative for gait problem, neck pain and neck stiffness.  Skin: Negative for rash.  Allergic/Immunologic: Positive for environmental allergies. Negative for food allergies.  Neurological: Negative for dizziness, tremors, seizures, syncope, facial asymmetry, speech difficulty, weakness, light-headedness, numbness and headaches.  Hematological: Negative for adenopathy. Does not bruise/bleed easily.  Psychiatric/Behavioral: Negative for agitation, confusion and sleep disturbance.       Objective:   Physical Exam Vitals and nursing note reviewed.  Constitutional:      General: She is awake. She is not in acute distress.    Appearance: Normal appearance. She is well-developed, well-groomed and overweight. She is not ill-appearing, toxic-appearing or diaphoretic.  HENT:     Head: Normocephalic and atraumatic.     Jaw: There is normal jaw occlusion. No trismus.     Salivary Glands: Right salivary gland is not diffusely enlarged or tender. Left salivary gland is not diffusely enlarged or tender.     Right Ear: Hearing, ear canal and external ear normal. A middle ear effusion is present. There is no impacted cerumen.     Left Ear: Hearing, ear canal and external ear normal. A middle ear effusion is present. There is no impacted cerumen.     Nose: Mucosal edema and rhinorrhea present. No nasal deformity, septal deviation, laceration or congestion. Rhinorrhea is clear.     Right Turbinates: Enlarged. Not swollen or pale.     Left Turbinates: Enlarged. Not swollen or pale.     Right Sinus: No maxillary sinus tenderness or frontal sinus tenderness.     Left Sinus: No maxillary sinus tenderness or frontal sinus tenderness.  Mouth/Throat:     Lips: Pink. No lesions.     Mouth: Mucous membranes are moist. Mucous membranes are not pale, not dry and not cyanotic. No injury,  lacerations, oral lesions or angioedema.     Dentition: No gingival swelling, dental caries, dental abscesses or gum lesions.     Tongue: No lesions. Tongue does not deviate from midline.     Palate: No mass and lesions.     Pharynx: Uvula midline. Pharyngeal swelling and posterior oropharyngeal erythema present. No oropharyngeal exudate or uvula swelling.     Tonsils: No tonsillar exudate or tonsillar abscesses.     Comments: Cobblestoning posterior pharynx; bilateral TMs air fluid level clear; bilateral allergic shiners; clear discharge bilateral nasal turbinates Eyes:     General: Lids are normal. Lids are everted, no foreign bodies appreciated. Gaze aligned appropriately. Allergic shiner present. No visual field deficit or scleral icterus.       Right eye: No foreign body, discharge or hordeolum.        Left eye: No foreign body, discharge or hordeolum.     Extraocular Movements: Extraocular movements intact.     Right eye: Normal extraocular motion and no nystagmus.     Left eye: Normal extraocular motion and no nystagmus.     Conjunctiva/sclera: Conjunctivae normal.     Right eye: Right conjunctiva is not injected. No chemosis, exudate or hemorrhage.    Left eye: Left conjunctiva is not injected. No chemosis, exudate or hemorrhage.    Pupils: Pupils are equal, round, and reactive to light. Pupils are equal.     Right eye: Pupil is round and reactive.     Left eye: Pupil is round and reactive.  Neck:     Thyroid: No thyroid mass, thyromegaly or thyroid tenderness.     Trachea: Trachea and phonation normal. No tracheal tenderness or tracheal deviation.  Cardiovascular:     Rate and Rhythm: Normal rate and regular rhythm.     Chest Wall: PMI is not displaced.     Pulses: Normal pulses.          Radial pulses are 2+ on the right side and 2+ on the left side.     Heart sounds: Normal heart sounds, S1 normal and S2 normal. No murmur heard. No friction rub. No gallop.   Pulmonary:      Effort: Pulmonary effort is normal. No accessory muscle usage or respiratory distress.     Breath sounds: Normal breath sounds and air entry. No stridor, decreased air movement or transmitted upper airway sounds. No decreased breath sounds, wheezing, rhonchi or rales.     Comments: Wearing mask due to covid 19 pandemic; spoke full sentences without difficulty; no cough observed in exam room Chest:     Chest wall: No tenderness.  Abdominal:     General: Abdomen is flat. Bowel sounds are decreased. There is no distension.     Palpations: Abdomen is soft. There is no mass.     Tenderness: There is no abdominal tenderness. There is no right CVA tenderness, left CVA tenderness, guarding or rebound. Negative signs include Murphy's sign.     Hernia: No hernia is present. There is no hernia in the umbilical area or ventral area.     Comments: Dull to percussion x 4 quads; hypoactive bowel sounds x 4 quads; standing to sitting to supine and reverse quickly without assistance on exam table  Musculoskeletal:        General: No swelling, tenderness, deformity  or signs of injury. Normal range of motion.     Right shoulder: Normal. No swelling, deformity, effusion or laceration. Normal range of motion.     Left shoulder: Normal. No swelling, deformity, effusion or laceration. Normal range of motion.     Right elbow: Normal. No swelling, deformity, effusion or lacerations. Normal range of motion.     Left elbow: Normal. No swelling, deformity, effusion or lacerations. Normal range of motion.     Right hand: Normal.     Left hand: Normal.     Cervical back: Normal, normal range of motion and neck supple. No swelling, edema, deformity, erythema, signs of trauma, lacerations, rigidity, tenderness or crepitus. No pain with movement. Normal range of motion.     Thoracic back: Normal. No swelling, edema, deformity, signs of trauma or lacerations. Normal range of motion.     Lumbar back: Normal. No swelling, edema,  deformity, signs of trauma or lacerations. Normal range of motion.     Right hip: Normal. No deformity or lacerations. Normal range of motion.     Left hip: Normal. No deformity or lacerations. Normal range of motion.     Right knee: Normal. No swelling, deformity or effusion.     Left knee: Normal. No swelling, deformity or effusion.     Right lower leg: No edema.     Left lower leg: No edema.  Lymphadenopathy:     Head:     Right side of head: No submental, submandibular, tonsillar, preauricular, posterior auricular or occipital adenopathy.     Left side of head: No submental, submandibular, tonsillar, preauricular, posterior auricular or occipital adenopathy.     Cervical: No cervical adenopathy.     Right cervical: No superficial, deep or posterior cervical adenopathy.    Left cervical: No superficial, deep or posterior cervical adenopathy.  Skin:    General: Skin is warm and dry.     Capillary Refill: Capillary refill takes less than 2 seconds.     Coloration: Skin is not ashen, cyanotic, jaundiced, mottled, pale or sallow.     Findings: No abrasion, abscess, acne, bruising, burn, ecchymosis, erythema, signs of injury, laceration, lesion, petechiae, rash or wound.     Nails: There is no clubbing.  Neurological:     General: No focal deficit present.     Mental Status: She is alert and oriented to person, place, and time. Mental status is at baseline. She is not disoriented.     GCS: GCS eye subscore is 4. GCS verbal subscore is 5. GCS motor subscore is 6.     Cranial Nerves: Cranial nerves are intact. No cranial nerve deficit, dysarthria or facial asymmetry.     Sensory: Sensation is intact. No sensory deficit.     Motor: Motor function is intact. No weakness, tremor, atrophy, abnormal muscle tone or seizure activity.     Coordination: Coordination is intact. Coordination normal.     Gait: Gait is intact. Gait normal.     Comments: Gait sure and steady in clinic; in/out of chair and  on/off exam table without difficulty  Psychiatric:        Attention and Perception: Attention and perception normal.        Mood and Affect: Mood and affect normal.        Speech: Speech normal.        Behavior: Behavior normal. Behavior is cooperative.        Thought Content: Thought content normal.  Cognition and Memory: Cognition and memory normal.        Judgment: Judgment normal.       Epic reviewed last CSP May 2012 normal except hemorrhoids follow up in 10 years. Per chart review weight 2020 Mar Be Well appt 167lbs    Assessment & Plan:  A-GERD, SAR, elevated blood pressure without diagnosis hypertension, elevated BMI, hypothyroidism  P-Discussed with patient her symptoms typical of allergy/GERD flare.  Symptoms worsen when lying flat.  Improved when head elevated.  Physical exam unremarkable in clinic today.  Discussed weight loss, avoid foods that typically worsen GERD symptoms, continue omeprazole DR 40mg  po daily but take with food not empty/fasting stomach.  Fasting labs Be Well scheduled with RN and I will call her when results available typically next day after blood draw.  Discussed extra abdominal fat, wearing too tight clothes/belt at waist,  stress, food triggers e.g. late meals prior to bedtime, acidic e.g. tomato sauces, spicy meats/foods can worsen GERD symptoms.  If omeprazole and TUMS not controlling symptoms consider follow up with GI provider.  Consider EGD.  Exitcare handout on GERD.  Patient verbalized understanding information/instructions, agreed with plan of care and had no further questions at this time.  Patient to continue her allergy medicine regimen each day. singulair 10mg  po qhs. Patient may use normal saline nasal spray 2 sprays each nostril q2h wa as needed. flonase Rolly Salter 1 spray each nostril BID.  Patient denied personal or family history of ENT cancer.  Avoid triggers if possible.  Shower prior to bedtime if exposed to triggers.  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  If allergic dust/dust mites recommend mattress/pillow covers/encasements; washing linens, vacuuming, sweeping, dusting weekly.  Call or return to clinic as needed if these symptoms worsen or fail to improve as anticipated.   Exitcare handout on allergic rhinitis and sinus rinse.  Patient verbalized understanding of instructions, agreed with plan of care and had no further questions at this time.  P2:  Avoidance and hand washing.  Patient concerned about her worsening cough/choking symptoms and that they may be related to growing up with parents who smoked in the house.  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  These may be elevated her blood pressure.  Encouraged weight loss, staying active 150 minutes exercise per week, decreasing added sugars in diet.  Follow up repeat BP in 1-2 weeks.  Same day follow up with provider if difficulty breathing/shortness of breath/chest pain/visual changes/worst headache of her life.    History of hypothyroidism.  Exec panel and Hgba1c fasting pending for annual labs/Be Well annual for work.  Discussed with patient levothyroxine should not be taken with other medications and at least 30 minutes prior to eating or 2 hours afterwards.  Discussed sometimes if thyroid enlarged can give choking sensation also/changes in voice/difficulty swallowing.  Patient verbalized understanding information/instructions, agreed with plan of care and had no further questions at this time.  Discussed CSP due May 2022 and patient stated she will schedule with her provider.  Patient verbalized understanding information/instructions and no further questions at this time.

## 2020-06-02 ENCOUNTER — Ambulatory Visit: Payer: Self-pay | Admitting: *Deleted

## 2020-06-02 VITALS — BP 132/91 | HR 75 | Ht 64.0 in | Wt 179.0 lb

## 2020-06-02 DIAGNOSIS — Z0000 Encounter for general adult medical examination without abnormal findings: Secondary | ICD-10-CM

## 2020-06-02 DIAGNOSIS — Z79899 Other long term (current) drug therapy: Secondary | ICD-10-CM

## 2020-06-02 NOTE — Progress Notes (Signed)
Be Well insurance premium discount evaluation: Labs Drawn. Replacements ROI form signed. Tobacco Free Attestation form signed.  Forms placed in paper chart.  

## 2020-06-03 ENCOUNTER — Encounter: Payer: Self-pay | Admitting: Registered Nurse

## 2020-06-03 LAB — CMP12+LP+TP+TSH+6AC+CBC/D/PLT
ALT: 19 IU/L (ref 0–32)
AST: 21 IU/L (ref 0–40)
Albumin/Globulin Ratio: 1.9 (ref 1.2–2.2)
Albumin: 4.1 g/dL (ref 3.8–4.8)
Alkaline Phosphatase: 78 IU/L (ref 44–121)
BUN/Creatinine Ratio: 18 (ref 12–28)
BUN: 12 mg/dL (ref 8–27)
Basophils Absolute: 0.1 10*3/uL (ref 0.0–0.2)
Basos: 1 %
Bilirubin Total: 0.4 mg/dL (ref 0.0–1.2)
Calcium: 8.6 mg/dL — ABNORMAL LOW (ref 8.7–10.3)
Chloride: 94 mmol/L — ABNORMAL LOW (ref 96–106)
Chol/HDL Ratio: 2.8 ratio (ref 0.0–4.4)
Cholesterol, Total: 152 mg/dL (ref 100–199)
Creatinine, Ser: 0.68 mg/dL (ref 0.57–1.00)
EOS (ABSOLUTE): 0.2 10*3/uL (ref 0.0–0.4)
Eos: 3 %
Estimated CHD Risk: <0.5 times avg. (ref 0.0–1.0)
Free Thyroxine Index: 1.7 (ref 1.2–4.9)
GGT: 18 IU/L (ref 0–60)
Globulin, Total: 2.2 g/dL (ref 1.5–4.5)
Glucose: 86 mg/dL (ref 65–99)
HDL: 54 mg/dL (ref >39)
Hematocrit: 38.6 % (ref 34.0–46.6)
Hemoglobin: 12.8 g/dL (ref 11.1–15.9)
Immature Grans (Abs): 0 10*3/uL (ref 0.0–0.1)
Immature Granulocytes: 0 %
Iron: 81 ug/dL (ref 27–139)
LDH: 195 IU/L (ref 119–226)
LDL Chol Calc (NIH): 86 mg/dL (ref 0–99)
Lymphocytes Absolute: 1.9 10*3/uL (ref 0.7–3.1)
Lymphs: 27 %
MCH: 30.3 pg (ref 26.6–33.0)
MCHC: 33.2 g/dL (ref 31.5–35.7)
MCV: 91 fL (ref 79–97)
Monocytes Absolute: 0.6 10*3/uL (ref 0.1–0.9)
Monocytes: 9 %
Neutrophils Absolute: 4.2 10*3/uL (ref 1.4–7.0)
Neutrophils: 60 %
Phosphorus: 3.9 mg/dL (ref 3.0–4.3)
Platelets: 212 10*3/uL (ref 150–450)
Potassium: 4.5 mmol/L (ref 3.5–5.2)
RBC: 4.23 x10E6/uL (ref 3.77–5.28)
RDW: 12.3 % (ref 11.7–15.4)
Sodium: 129 mmol/L — ABNORMAL LOW (ref 134–144)
T3 Uptake Ratio: 25 % (ref 24–39)
T4, Total: 6.7 ug/dL (ref 4.5–12.0)
TSH: 2.59 u[IU]/mL (ref 0.450–4.500)
Total Protein: 6.3 g/dL (ref 6.0–8.5)
Triglycerides: 61 mg/dL (ref 0–149)
Uric Acid: 3.7 mg/dL (ref 3.0–7.2)
VLDL Cholesterol Cal: 12 mg/dL (ref 5–40)
WBC: 7 10*3/uL (ref 3.4–10.8)
eGFR: 98 mL/min/{1.73_m2} (ref >59)

## 2020-06-03 LAB — MAGNESIUM: Magnesium: 1.8 mg/dL (ref 1.6–2.3)

## 2020-06-03 LAB — HGB A1C W/O EAG: Hgb A1c MFr Bld: 5.6 % (ref 4.8–5.6)

## 2020-06-03 MED ORDER — CALCIUM CARBONATE ANTACID 500 MG PO CHEW: CHEWABLE_TABLET | ORAL | Status: AC

## 2020-06-03 NOTE — Patient Instructions (Signed)
Hypocalcemia, Adult Hypocalcemia is when the level of calcium in a person's blood is below normal. Calcium is a mineral that is used by the body in many ways. Not having enough blood calcium can affect the nervous system. This can lead to problems with muscles, the heart, and the brain. What are the causes? This condition may be caused by:  A deficiency of vitamin D or magnesium or both.  Decreased levels of parathyroid hormone (hypoparathyroidism).  Kidney function problems.  Low levels of a body protein called albumin.  Inflammation of the pancreas (pancreatitis).  Not taking in enough vitamins and minerals in the diet or having intestinal problems that interfere with nutrient absorption.  Certain medicines. What are the signs or symptoms? Some people may not have any symptoms, especially if they have long-term (chronic) hypocalcemia. Symptoms of this condition may include:  Numbness and tingling in the fingers, toes, or around the mouth.  Muscle twitching, aches, or cramps, especially in the legs, feet, and back.  Spasm of the voice box (laryngospasm). This may make it difficult to breath or speak.  Fast heartbeats (palpitations) and abnormal heart rhythms (arrhythmias).  Shaking uncontrollably (seizures).  Memory problems, confusion, or difficulty thinking.  Depression, anxiety, irritability, or changes in personality. Long-term symptoms of this condition may include:  Coarse, brittle hair and nails.  Dry skin or lasting skin diseases (psoriasis, eczema, or dermatitis).  Dental cavities.  Clouding of the eye lens (cataracts). How is this diagnosed? This condition is usually diagnosed with a blood test. You may also have other tests to help determine the underlying cause of the condition. This may include more blood tests and imaging tests.   How is this treated? This condition may be treated with:  Calcium given by mouth (orally) or given through an IV. The method  used for giving calcium will depend on the severity of the condition. If your condition is severe, you may need to be closely monitored in the hospital.  Giving other minerals (electrolytes), such as magnesium. Other treatment will depend on the cause of the condition. Follow these instructions at home:  Follow diet instructions from your health care provider or dietitian.  Take supplements only as told by your health care provider.  Keep all follow-up visits as told by your health care provider. This is important. Contact a health care provider if you:  Have increased muscle twitching or cramps.  Have new swelling in the feet, ankles, or legs.  Develop changes in mood, memory, or personality. Get help right away if you:  Have chest pain.  Have persistent rapid or irregular heartbeats.  Have difficulty breathing.  Faint.  Start to have seizures.  Have confusion. Summary  Hypocalcemia is when the level of calcium in a person's blood is below normal. Not having enough blood calcium can affect the nervous system. This can lead to problems with muscles, the heart, and the brain.  This condition may be treated with calcium given by mouth or through an IV, taking other minerals, and treating the underlying cause of hypocalcemia.  Take supplements only as told by your health care provider.  Contact a health care provider if you have new or worsening symptoms.  Keep all follow-up visits as told by your health care provider. This is important. This information is not intended to replace advice given to you by your health care provider. Make sure you discuss any questions you have with your health care provider. Document Revised: 03/20/2018 Document Reviewed: 03/20/2018 Elsevier   Patient Education  2021 [employer]  Hyponatremia Hyponatremia is when the amount of salt (sodium) in your blood is too low. When salt levels are low, your body may take in extra water. This can cause  swelling throughout the body. The swelling often affects the brain. What are the causes? This condition may be caused by:  Certain medical problems or conditions.  Vomiting a lot.  Having watery poop (diarrhea) often.  Certain medicines or illegal drugs.  Not having enough water in the body (dehydration).  Drinking too much water.  Eating a diet that is low in salt.  Large burns on your body.  Too much sweating. What increases the risk? You are more likely to get this condition if you:  Have long-term (chronic) kidney disease.  Have heart failure.  Have a medical condition that causes you to have watery poop often.  Do very hard exercises.  Take medicines that affect the amount of salt is in your blood. What are the signs or symptoms? Symptoms of this condition include:  Headache.  Feeling like you may vomit (nausea).  Vomiting.  Being very tired (lethargic).  Muscle weakness and cramps.  Not wanting to eat as much as normal (loss of appetite).  Feeling weak or light-headed. Severe symptoms of this condition include:  Confusion.  Feeling restless (agitation).  Having a fast heart rate.  Passing out (fainting).  Seizures.  Coma. How is this treated? Treatment for this condition depends on the cause. Treatment may include:  Getting fluids through an IV tube that is put into one of your veins.  Taking medicines to fix the salt levels in your blood. If medicines are causing the problem, your medicines will need to be changed.  Limiting how much water or fluid you take in.  Monitoring in the hospital to watch your symptoms.   Follow these instructions at home:  Take over-the-counter and prescription medicines only as told by your doctor. Many medicines can make this condition worse. Talk with your doctor about any medicines that you are taking.  Eat and drink exactly as you are told by your doctor. ? Eat only the foods you are told to  eat. ? Limit how much fluid you take.  Do not drink alcohol.  Keep all follow-up visits as told by your doctor. This is important.   Contact a doctor if:  You feel more like you may vomit.  You feel more tired.  Your headache gets worse.  You feel more confused.  You feel weaker.  Your symptoms go away and then they come back.  You have trouble following the diet instructions. Get help right away if:  You have a seizure.  You pass out.  You keep having watery poop.  You keep vomiting. Summary  Hyponatremia is when the amount of salt in your blood is too low.  When salt levels are low, you can have swelling throughout the body. The swelling mostly affects the brain.  Treatment depends on the cause. Treatment may include getting IV fluids, medicines, or not drinking as much fluid. This information is not intended to replace advice given to you by your health care provider. Make sure you discuss any questions you have with your health care provider. Document Revised: 05/28/2018 Document Reviewed: 02/12/2018 Elsevier Patient Education  2021 [employer].

## 2020-06-03 NOTE — Telephone Encounter (Signed)
Contacted patient via telephone and notified her of lab results, signs and symptoms of hyponatremia, hypocalcemia.  She reported she had collard greens for dinner last night and no symptoms of trouble breathing today.  Patient reports typically has a greek yogurt and string cheese daily (her only dairy intake typically).  Discussed calcium content in various foods:  Dairy and Soy  Amount      Calcium (mg)     Milk (skim, low fat, whole)  1 cup  300      Buttermilk  1 cup  300  Cottage Cheese  0.5 cup  65  Ice Cream or Ice Milk  0.5 cup  100  Sour Cream, cultured  1 cup  250  Soy Milk, calcium fortified  1 cup  200 to 400  Yogurt  1 cup  450  Yogurt drink  12 oz  300  Carnation Instant Breakfast      1 packet  250  Hot Cocoa, calcium fortified  1 packet  320  Nonfat dry milk powder  5 Tbsp  300  Brie Cheese  1 oz  50  Hard Cheese (cheddar, jack)      1 oz  200  Mozzarella  1 oz  200  Parmesan Cheese  1 Tbsp  70  Swiss or Gruyere  1 oz  270     Vegetables Acorn squash, cooked  1 cup    90      Arugula, raw  1 cup  125  Bok Choy, raw  1 cup  40  Broccoli, cooked  1 cup  180  Chard or Okra, cooked  1 cup  100  Chicory (curly endive), raw      1 cup  40  Collard greens  1 cup  50  Corn, brine packed  1 cup  10  Dandelion greens, raw  1 cup  80  Kale, raw  1 cup  55  Kelp or Kombe  1 cup  60  Mustard greens  1 cup  40  Spinach, cooked  1 cup  240  Turnip greens, raw  1 cup  80     Fruits Figs, dried, uncooked  1 cup    300      Kiwi, raw  1 cup  50  Orange juice, calcium fortified  8 oz  300  Orange juice, from concentrate      1 cup  20     Legumes Garbanzo Beans, cooked  1 cup  80      Legumes, general, cooked      0.5 cup  15 to 50  Pinto Beans, cooked  1 cup  75  Soybeans,  boiled  0.5 cup      100  Temphe  0.5 cup  75  Tofu, firm, calcium set  4 oz      250 to 750      Tofu, soft regular  4 oz  120 to 390  White Beans, cooked  0.5 cup  70     Grains Cereals (calcium fortified)      0.5 to 1 cup      250 to 1000      Amaranth, cooked  0.5 cup  135  Bread, calcium fortified  1 slice     150 to 200  Brown rice, long grain, raw      1 cup  50  Oatmeal, instant  1 package  100 to 150  Tortillas, corn  2  85     Nuts and Seeds Almonds, toasted unblanched  1 oz.  80  Sesame seeds, whole roasted      1 oz.  280  Sesame tahini  1 oz. (2 Tbsp)      130      Sunflower seeds, dried  1 oz.  50     Fish Mackerel, canned      3 oz.      250  Salmon, canned, with bones      3 oz.  170 to 210      Sardines  3 oz.  370     Other Molasses, blackstrap      1 Tbsp      135     Patient asked about prunes and they are 50mg  per 1 cup.  Discussed goal 4 to 5 servings of calcium rich foods per day.  She reported calcium carbonate in her tums at home 1000mg  per tablet.  Discussed with patient do not take more than 2 tablets per day.  Discussed intake of calcium above recommended intake could put her at risk for kidney stones.  She will schedule lab with RN for repeat in 2-4 weeks BMET plus calcium.  Patient notified handouts and information in her mychart also.  Patient verbalized understanding information/instructions, agreed with plan of care and had no further questions at this time.

## 2020-06-05 NOTE — Progress Notes (Signed)
Attempted to call pt to discuss as MyChart mesg not yet read. No answer. LVMRCB.

## 2020-06-05 NOTE — Progress Notes (Signed)
Noted reviewed RN Rolly Salter note and patient denying symptoms today.  RN Rolly Salter gave patient handouts.

## 2020-06-05 NOTE — Progress Notes (Signed)
Pt by office. Diet and exercise recommendations discussed re: low electrolytes, borderline A1c. Denies any sx of low sodium or calcium.  Routine f/u with pcp. Copy of labs provided to pt. Results routed to pcp per pt request. No further questions/concerns.

## 2020-06-05 NOTE — Progress Notes (Signed)
Also provided handouts on hyponatremia and hypocalcemia.

## 2020-06-05 NOTE — Telephone Encounter (Signed)
See lab note patient notified of results hyponatremia and hypocalcermia and patient adjusting diet/taking supplement and will have follow up nonfasting labs.

## 2020-08-04 ENCOUNTER — Ambulatory Visit: Payer: Self-pay | Admitting: *Deleted

## 2020-08-04 ENCOUNTER — Other Ambulatory Visit: Payer: Self-pay

## 2020-08-04 DIAGNOSIS — E871 Hypo-osmolality and hyponatremia: Secondary | ICD-10-CM

## 2020-08-05 LAB — BASIC METABOLIC PANEL
BUN/Creatinine Ratio: 14 (ref 12–28)
BUN: 10 mg/dL (ref 8–27)
CO2: 22 mmol/L (ref 20–29)
Calcium: 9.3 mg/dL (ref 8.7–10.3)
Chloride: 100 mmol/L (ref 96–106)
Creatinine, Ser: 0.72 mg/dL (ref 0.57–1.00)
Glucose: 73 mg/dL (ref 65–99)
Potassium: 4.4 mmol/L (ref 3.5–5.2)
Sodium: 137 mmol/L (ref 134–144)
eGFR: 93 mL/min/{1.73_m2} (ref >59)

## 2020-08-08 NOTE — Telephone Encounter (Signed)
Repeat labs calcium and BMET normal

## 2020-10-25 ENCOUNTER — Encounter: Payer: Self-pay | Admitting: Registered Nurse

## 2020-10-25 ENCOUNTER — Telehealth: Payer: Self-pay | Admitting: Registered Nurse

## 2020-10-25 DIAGNOSIS — Z20822 Contact with and (suspected) exposure to covid-19: Secondary | ICD-10-CM

## 2020-10-25 NOTE — Telephone Encounter (Signed)
[employer] Tim notified NP that pt reported covid exposure 10/24/20 up to date on covid vaccines and was notified after assisting friends with home cleaning that she tested positive for covid after having 4 negative covid tests.   Patient asymptomatic/did not develop symptoms of  trouble breathing, chest pain, nausea, vomiting, diarrhea, cough, runny nose, congestion, sore throat, HA, body aches, fever or chills.   Monitor for symptoms x 10 days and wearing mask at work and no eating in employee lunch room.  Day 0 10/24/20  Day 10 11/03/20  test day 5 10/29/20  Reviewed possible Covid symptoms including cough, shortness of breath with exertion or at rest, runny nose, congestion, sinus pain/pressure, sore throat, fever/chills, body aches, fatigue, loss of taste/smell, GI symptoms of nausea/vomiting/diarrhea. Discussed current variant usually sore throat, congestion, runny nose, cough, headache, body aches (acts more like allergies/flare)  If symptoms develop patient to go home/stay home/isolate in own room and if possible use only one bathroom if living with others in home.  Wear mask when out of room to help prevent spread to others in household.  Sanitize high touch surfaces with lysol/chlorox/bleach spray or wipes daily as viruses are known to live on surfaces from 24 hours to days.  Discussed with patient she can contact me at [phone] when clinic closed if questions or concerns RN Rolly Salter in clinic Mondays, Tuesdays, Thursdays all day and Fridays until lunch [phone]   Pt verbalized understanding and agreement with plan of care. No further questions/concerns at this time. Pt reminded to contact clinic with any changes in symptoms or questions/concerns.   Patient A&Ox3 spoke full sentences without difficulty, no cough/congestion/throat clearing audible during 4 minute telephone call.   HR notified patient to wear mask x 10 days, monitor for symptoms test 10/29/20 or if symptoms develop contact clinic staff.   strict mask wear through Day 10 and no eating in employee lunch room.

## 2020-10-29 NOTE — Telephone Encounter (Signed)
Patient contacted via telephone spoke full sentences without difficulty A&Ox3 no nasal congestion/throat clearing or cough during 2 minute telephone call feeling well continues asymptomatic and negative home covid test reiterated to monitor for symptoms and mask wear through Day 10 11/03/20 and no eating in employee lunch room.  Patient verbalized understanding information/instructions, agreed with plan of care and had no further questions at this time.  HR notified continues asymptomatic/negative test and strict mask wear/no eating in employee lunch room through day 10 11/03/20

## 2020-11-02 NOTE — Telephone Encounter (Signed)
Called to f/u for sx check. No answer. LVMRCB. Will try again tomorrow on Day 10.

## 2020-11-02 NOTE — Telephone Encounter (Signed)
Pt returned call and stated she feels well. Remains asymptomatic.

## 2020-11-02 NOTE — Telephone Encounter (Signed)
Reviewed RN Rolly Salter note patient feeling well continues asymptomatic day 10 tomorrow 8/12

## 2020-11-05 NOTE — Telephone Encounter (Signed)
Patient contacted via telephone feeling well asymptomatic Day 10 11/03/20 cleared to return to eating in employee lunch room.  Patient A&Ox3 spoke full sentences without difficulty no cough/throat clearing/nasal congestion during 2 minute telephone call.  HR notified asymptomatic and cleared to eat in employee lunch room again.  Patient verbalized understanding information/instructions, agreed with plan of care and had no further questions at this time.

## 2021-03-23 ENCOUNTER — Other Ambulatory Visit: Payer: Self-pay | Admitting: Registered Nurse

## 2021-03-23 DIAGNOSIS — J309 Allergic rhinitis, unspecified: Secondary | ICD-10-CM

## 2021-03-24 ENCOUNTER — Encounter: Payer: Self-pay | Admitting: Registered Nurse

## 2021-03-24 NOTE — Telephone Encounter (Signed)
Patient with year round allergic rhinitis.  Refilled singulair 10mg  po qhs #90 RF3 electronic Rx to her pharmacy of choice.

## 2021-04-11 ENCOUNTER — Other Ambulatory Visit: Payer: Self-pay | Admitting: Physician Assistant

## 2021-04-11 DIAGNOSIS — Z1231 Encounter for screening mammogram for malignant neoplasm of breast: Secondary | ICD-10-CM

## 2021-04-17 ENCOUNTER — Ambulatory Visit
Admission: RE | Admit: 2021-04-17 | Discharge: 2021-04-17 | Disposition: A | Discharge status: Home / Self Care | Payer: No Typology Code available for payment source | Source: Ambulatory Visit | Attending: Physician Assistant | Admitting: Physician Assistant

## 2021-04-17 ENCOUNTER — Other Ambulatory Visit: Payer: Self-pay

## 2021-04-17 DIAGNOSIS — Z1231 Encounter for screening mammogram for malignant neoplasm of breast: Secondary | ICD-10-CM

## 2021-05-22 ENCOUNTER — Ambulatory Visit: Payer: Self-pay | Admitting: Registered Nurse

## 2021-05-22 ENCOUNTER — Encounter: Payer: Self-pay | Admitting: Registered Nurse

## 2021-05-22 VITALS — BP 140/102 | HR 83 | Temp 98.0°F

## 2021-05-22 DIAGNOSIS — J019 Acute sinusitis, unspecified: Secondary | ICD-10-CM

## 2021-05-22 DIAGNOSIS — R030 Elevated blood-pressure reading, without diagnosis of hypertension: Secondary | ICD-10-CM

## 2021-05-22 MED ORDER — CEPHALEXIN 500 MG PO CAPS: 500.0000 mg | ORAL_CAPSULE | Freq: Two times a day (BID) | ORAL | 0 refills | Status: AC

## 2021-05-22 MED ORDER — ACETAMINOPHEN 500 MG PO TABS: 1000.0000 mg | ORAL_TABLET | Freq: Four times a day (QID) | ORAL | 0 refills | Status: AC | PRN

## 2021-05-22 NOTE — Patient Instructions (Signed)
Allergic Rhinitis, Adult Allergic rhinitis is an allergic reaction that affects the mucous membrane inside the nose. The mucous membrane is the tissue that produces mucus. There are two types of allergic rhinitis: Seasonal. This type is also called hay fever and happens only during certain seasons. Perennial. This type can happen at any time of the year. Allergic rhinitis cannot be spread from person to person. This condition can be mild, moderate, or severe. It can develop at any age and may be outgrown. What are the causes? This condition is caused by allergens. These are things that can cause an allergic reaction. Allergens may differ for seasonal allergic rhinitis and perennial allergic rhinitis. Seasonal allergic rhinitis is triggered by pollen. Pollen can come from grasses, trees, and weeds. Perennial allergic rhinitis may be triggered by: Dust mites. Proteins in a pet's urine, saliva, or dander. Dander is dead skin cells from a pet. Smoke, mold, or car fumes. What increases the risk? You are more likely to develop this condition if you have a family history of allergies or other conditions related to allergies, including: Allergic conjunctivitis. This is inflammation of parts of the eyes and eyelids. Asthma. This condition affects the lungs and makes it hard to breathe. Atopic dermatitis or eczema. This is long term (chronic) inflammation of the skin. Food allergies. What are the signs or symptoms? Symptoms of this condition include: Sneezing or coughing. A stuffy nose (nasal congestion), itchy nose, or nasal discharge. Itchy eyes and tearing of the eyes. A feeling of mucus dripping down the back of your throat (postnasal drip). Trouble sleeping. Tiredness or fatigue. Headache. Sore throat. How is this diagnosed? This condition may be diagnosed with your symptoms, medical history, and physical exam. Your health care provider may check for related conditions, such  as: Asthma. Pink eye. This is eye inflammation caused by infection (conjunctivitis). Ear infection. Upper respiratory infection. This is an infection in the nose, throat, or upper airways. You may also have tests to find out which allergens trigger your symptoms. These may include skin tests or blood tests. How is this treated? There is no cure for this condition, but treatment can help control symptoms. Treatment may include: Taking medicines that block allergy symptoms, such as corticosteroids and antihistamines. Medicine may be given as a shot, nasal spray, or pill. Avoiding any allergens. Being exposed again and again to tiny amounts of allergens to help you build a defense against allergens (immunotherapy). This is done if other treatments have not helped. It may include: Allergy shots. These are injected medicines that have small amounts of allergen in them. Sublingual immunotherapy. This involves taking small doses of a medicine with allergen in it under your tongue. If these treatments do not work, your health care provider may prescribe newer, stronger medicines. Follow these instructions at home: Avoiding allergens Find out what you are allergic to and avoid those allergens. These are some things you can do to help avoid allergens: If you have perennial allergies: Replace carpet with wood, tile, or vinyl flooring. Carpet can trap dander and dust. Do not smoke. Do not allow smoking in your home. Change your heating and air conditioning filters at least once a month. If you have seasonal allergies, take these steps during allergy season: Keep windows closed as much as possible. Plan outdoor activities when pollen counts are lowest. Check pollen counts before you plan outdoor activities. When coming indoors, change clothing and shower before sitting on furniture or bedding. If you have a pet in  the house that produces allergens: Keep the pet out of the bedroom. Vacuum, sweep, and  dust regularly. General instructions Take over-the-counter and prescription medicines only as told by your health care provider. Drink enough fluid to keep your urine pale yellow. Keep all follow-up visits as told by your health care provider. This is important. Where to find more information American Academy of Allergy, Asthma & Immunology: www.aaaai.org Contact a health care provider if: You have a fever. You develop a cough that does not go away. You make whistling sounds when you breathe (wheeze). Your symptoms slow you down or stop you from doing your normal activities each day. Get help right away if: You have shortness of breath. This symptom may represent a serious problem that is an emergency. Do not wait to see if the symptom will go away. Get medical help right away. Call your local emergency services (911 in the [place] Do not drive yourself to the hospital. Summary Allergic rhinitis may be managed by taking medicines as directed and avoiding allergens. If you have seasonal allergies, keep windows closed as much as possible during allergy season. Contact your health care provider if you develop a fever or a cough that does not go away. This information is not intended to replace advice given to you by your health care provider. Make sure you discuss any questions you have with your health care provider. Document Revised: 04/30/2019 Document Reviewed: 03/09/2019 Elsevier Patient Education  2022 [employer]. How to Perform a Sinus Rinse A sinus rinse is a home treatment that is used to rinse your sinuses with a germ-free (sterile) mixture of salt and water (saline solution). Sinuses are air-filled spaces in your skull that are behind the bones of your face and forehead. They open into your nasal cavity. A sinus rinse can help to clear mucus, dirt, dust, or pollen from your nasal cavity. You may do a sinus rinse when you have a cold, a virus, nasal allergy symptoms, a sinus  infection, or stuffiness in your nose or sinuses. What are the risks? A sinus rinse is generally safe and effective. However, there are a few risks, which include: A burning sensation in your sinuses. This may happen if you do not make the saline solution as directed. Be sure to follow all directions when making the saline solution. Nasal irritation. Infection. This may be from unclean supplies or from contaminated water. Infection from contaminated water is rare, but possible. Do not do a sinus rinse if you have had ear or nasal surgery, ear infection, or plugged ears, unless recommended by your health care provider. Supplies needed: Saline solution or powder. Distilled or sterile water to mix with saline powder. You may use boiled and cooled tap water. Boil tap water for 5 minutes; cool until it is lukewarm. Use within 24 hours. Do not use regular tap water to mix with the saline solution. Neti pot or nasal rinse bottle. These supplies release the saline solution into your nose and through your sinuses. Neti pots and nasal rinse bottles can be purchased at [employer], a health food store, or online. How to perform a sinus rinse  Wash your hands with soap and water for at least 20 seconds. If soap and water are not available, use hand sanitizer. Wash your device according to the directions that came with the product and then dry it. Use the solution that comes with your product or one that is sold separately in stores. Follow the mixing directions  on the package to mix with sterile or distilled water. Fill the device with the amount of saline solution noted in the device instructions. Stand by a sink and tilt your head sideways over the sink. Place the spout of the device in your upper nostril (the one closer to the ceiling). Gently pour or squeeze the saline solution into your nasal cavity. The liquid should drain out from the lower nostril if you are not too congested. While  rinsing, breathe through your open mouth. Gently blow your nose to clear any mucus and rinse solution. Blowing too hard may cause ear pain. Turn your head in the other direction and repeat in your other nostril. Clean and rinse your device with clean water and then air-dry it. Talk with your health care provider or pharmacist if you have questions about how to do a sinus rinse. Summary A sinus rinse is a home treatment that is used to rinse your sinuses with a sterile mixture of salt and water (saline solution). You may do a sinus rinse when you have a cold, a virus, nasal allergy symptoms, a sinus infection, or stuffiness in your nose or sinuses. A sinus rinse is generally safe and effective. Follow all instructions carefully. This information is not intended to replace advice given to you by your health care provider. Make sure you discuss any questions you have with your health care provider. Document Revised: 08/28/2020 Document Reviewed: 08/28/2020 Elsevier Patient Education  2022 [employer]. Sinusitis, Adult Sinusitis is inflammation of your sinuses. Sinuses are hollow spaces in the bones around your face. Your sinuses are located: Around your eyes. In the middle of your forehead. Behind your nose. In your cheekbones. Mucus normally drains out of your sinuses. When your nasal tissues become inflamed or swollen, mucus can become trapped or blocked. This allows bacteria, viruses, and fungi to grow, which leads to infection. Most infections of the sinuses are caused by a virus. Sinusitis can develop quickly. It can last for up to 4 weeks (acute) or for more than 12 weeks (chronic). Sinusitis often develops after a cold. What are the causes? This condition is caused by anything that creates swelling in the sinuses or stops mucus from draining. This includes: Allergies. Asthma. Infection from bacteria or viruses. Deformities or blockages in your nose or sinuses. Abnormal growths in the  nose (nasal polyps). Pollutants, such as chemicals or irritants in the air. Infection from fungi (rare). What increases the risk? You are more likely to develop this condition if you: Have a weak body defense system (immune system). Do a lot of swimming or diving. Overuse nasal sprays. Smoke. What are the signs or symptoms? The main symptoms of this condition are pain and a feeling of pressure around the affected sinuses. Other symptoms include: Stuffy nose or congestion. Thick drainage from your nose. Swelling and warmth over the affected sinuses. Headache. Upper toothache. A cough that may get worse at night. Extra mucus that collects in the throat or the back of the nose (postnasal drip). Decreased sense of smell and taste. Fatigue. A fever. Sore throat. Bad breath. How is this diagnosed? This condition is diagnosed based on: Your symptoms. Your medical history. A physical exam. Tests to find out if your condition is acute or chronic. This may include: Checking your nose for nasal polyps. Viewing your sinuses using a device that has a light (endoscope). Testing for allergies or bacteria. Imaging tests, such as an MRI or CT scan. In rare cases, a bone  biopsy may be done to rule out more serious types of fungal sinus disease. How is this treated? Treatment for sinusitis depends on the cause and whether your condition is chronic or acute. If caused by a virus, your symptoms should go away on their own within 10 days. You may be given medicines to relieve symptoms. They include: Medicines that shrink swollen nasal passages (topical intranasal decongestants). Medicines that treat allergies (antihistamines). A spray that eases inflammation of the nostrils (topical intranasal corticosteroids). Rinses that help get rid of thick mucus in your nose (nasal saline washes). If caused by bacteria, your health care provider may recommend waiting to see if your symptoms improve. Most  bacterial infections will get better without antibiotic medicine. You may be given antibiotics if you have: A severe infection. A weak immune system. If caused by narrow nasal passages or nasal polyps, you may need to have surgery. Follow these instructions at home: Medicines Take, use, or apply over-the-counter and prescription medicines only as told by your health care provider. These may include nasal sprays. If you were prescribed an antibiotic medicine, take it as told by your health care provider. Do not stop taking the antibiotic even if you start to feel better. Hydrate and humidify  Drink enough fluid to keep your urine pale yellow. Staying hydrated will help to thin your mucus. Use a cool mist humidifier to keep the humidity level in your home above 50%. Inhale steam for 10-15 minutes, 3-4 times a day, or as told by your health care provider. You can do this in the bathroom while a hot shower is running. Limit your exposure to cool or dry air. Rest Rest as much as possible. Sleep with your head raised (elevated). Make sure you get enough sleep each night. General instructions  Apply a warm, moist washcloth to your face 3-4 times a day or as told by your health care provider. This will help with discomfort. Wash your hands often with soap and water to reduce your exposure to germs. If soap and water are not available, use hand sanitizer. Do not smoke. Avoid being around people who are smoking (secondhand smoke). Keep all follow-up visits as told by your health care provider. This is important. Contact a health care provider if: You have a fever. Your symptoms get worse. Your symptoms do not improve within 10 days. Get help right away if: You have a severe headache. You have persistent vomiting. You have severe pain or swelling around your face or eyes. You have vision problems. You develop confusion. Your neck is stiff. You have trouble breathing. Summary Sinusitis is  soreness and inflammation of your sinuses. Sinuses are hollow spaces in the bones around your face. This condition is caused by nasal tissues that become inflamed or swollen. The swelling traps or blocks the flow of mucus. This allows bacteria, viruses, and fungi to grow, which leads to infection. If you were prescribed an antibiotic medicine, take it as told by your health care provider. Do not stop taking the antibiotic even if you start to feel better. Keep all follow-up visits as told by your health care provider. This is important. This information is not intended to replace advice given to you by your health care provider. Make sure you discuss any questions you have with your health care provider. Document Revised: 08/11/2017 Document Reviewed: 08/11/2017 Elsevier Patient Education  2022 [employer] Inc. Sinus Headache A sinus headache occurs when your sinuses become clogged or swollen. Sinuses are air-filled spaces  in your skull that are behind the bones of your face and forehead. Sinus headaches can range from mild to severe. What are the causes? A sinus headache can result from various conditions that affect the sinuses. Common causes include: Colds. Sinus infections. Allergies. Many people confuse sinus headaches with migraines or tension headaches because both headaches can cause facial pain and nasal symptoms. What are the signs or symptoms? The main symptom of this condition is a headache that may feel like pain or pressure in your face, forehead, ears, or upper teeth. People who have a sinus headache often have other symptoms, such as: Congested or runny nose. Fever. Inability to smell. Weather changes can make symptoms worse. How is this diagnosed? This condition may be diagnosed based on: A physical exam and medical history. Imaging tests, such as a CT scan or MRI, to check for problems with the sinuses. Examination of the sinuses using a thin tool with a camera that is  inserted through your nose (endoscopy). How is this treated? Treatment for this condition depends on the cause. Sinus pain that is caused by a sinus infection may be treated with antibiotic medicine. Sinus pain that is caused by congestion may be helped by rinsing out (flushing) the nose and sinuses with saline solution. Sinus pain that is caused by allergies may be helped by allergy medicines (antihistamines) and medicated nasal sprays. Sinus surgery may be needed in some cases if other treatments do not help. Follow these instructions at home: General instructions If directed: Apply a warm, moist washcloth to your face to help relieve pain. Use a nasal saline wash. Hydrate and humidify Drink enough water to keep your urine clear or pale yellow. Staying hydrated will help to thin your mucus. Use a cool mist humidifier to keep the humidity level in your home above 50%. Inhale steam for 10-15 minutes, 3-4 times a day or as told by your health care provider. You can do this in the bathroom while a hot shower is running. Limit your exposure to cool or dry air. Medicines  Take over-the-counter and prescription medicines only as told by your health care provider. If you were prescribed an antibiotic medicine, take it as told by your health care provider. Do not stop taking the antibiotic even if you start to feel better. If you have congestion, use a nasal spray to help lessen pressure. Contact a health care provider if: You have a headache more than one time a week. You have sensitivity to light or sound. You develop a fever. You feel nauseous or you vomit. Your headaches do not get better with treatment. Many people think that they have a sinus headache when they actually have a migraine or a tension headache. Get help right away if: You have vision problems. You have sudden, severe pain in your face or head. You have a seizure. You are confused. You have a stiff neck. Summary A sinus  headache occurs when your sinuses become clogged or swollen. A sinus headache can result from various conditions that affect the sinuses, such as a cold, a sinus infection, or an allergy. Treatment for this condition depends on the cause. It may include medicine, such as antibiotics or antihistamines. This information is not intended to replace advice given to you by your health care provider. Make sure you discuss any questions you have with your health care provider. Document Revised: 12/17/2019 Document Reviewed: 12/21/2019 Elsevier Patient Education  2022 [employer].

## 2021-05-22 NOTE — Progress Notes (Signed)
Subjective:    Patient ID: Victoria Morales, female    DOB: July 01, 1955, 66 y.o.   MRN: [id number]  65y/o Caucasian established married female pt c/o frontal and maxillary sinus pain and pressure. Endorses productive cough, intermittent sore throat, itchy but nonpainful ears. Symptoms present x3 days. Mucus clear to yellow tinged but now with upper dental pain since yesterday.  Using OTC Tylenol sinus, flonase, nasal saline twice a day, and singulair at home.   My blood pressure up from the cold medicine.     Review of Systems  Constitutional:  Negative for activity change, appetite change, chills, diaphoresis, fatigue, fever and unexpected weight change.  HENT:  Positive for congestion, postnasal drip, rhinorrhea, sinus pressure and sinus pain. Negative for dental problem, drooling, ear discharge, ear pain, facial swelling, hearing loss, mouth sores, nosebleeds, sneezing, sore throat, tinnitus, trouble swallowing and voice change.   Eyes:  Negative for photophobia, pain, discharge, redness, itching and visual disturbance.  Respiratory:  Positive for cough. Negative for choking, chest tightness, shortness of breath, wheezing and stridor.   Cardiovascular:  Negative for chest pain, palpitations and leg swelling.  Gastrointestinal:  Negative for abdominal distention, abdominal pain, blood in stool, constipation, diarrhea, nausea and vomiting.  Endocrine: Negative for cold intolerance and heat intolerance.  Genitourinary:  Negative for difficulty urinating, dysuria and hematuria.  Musculoskeletal:  Negative for arthralgias, back pain, gait problem, joint swelling, myalgias, neck pain and neck stiffness.  Skin:  Negative for color change, pallor, rash and wound.  Allergic/Immunologic: Positive for environmental allergies. Negative for food allergies.  Neurological:  Positive for headaches. Negative for dizziness, tremors, seizures, syncope, facial asymmetry, speech difficulty, weakness,  light-headedness and numbness.  Hematological:  Negative for adenopathy. Does not bruise/bleed easily.  Psychiatric/Behavioral:  Negative for agitation, behavioral problems, confusion and sleep disturbance.       Objective:   Physical Exam Vitals and nursing note reviewed.  Constitutional:      General: She is awake. She is not in acute distress.    Appearance: Normal appearance. She is well-developed, well-groomed and overweight. She is not ill-appearing, toxic-appearing or diaphoretic.  HENT:     Head: Normocephalic and atraumatic.     Jaw: There is normal jaw occlusion. No trismus.     Salivary Glands: Right salivary gland is not diffusely enlarged or tender. Left salivary gland is not diffusely enlarged or tender.     Right Ear: Hearing, ear canal and external ear normal. No decreased hearing noted. No drainage. A middle ear effusion is present. There is no impacted cerumen.     Left Ear: Hearing, ear canal and external ear normal. No decreased hearing noted. No drainage. A middle ear effusion is present. There is no impacted cerumen.     Nose: Mucosal edema, congestion and rhinorrhea present. No nasal deformity, septal deviation or laceration. Rhinorrhea is clear.     Right Turbinates: Enlarged and swollen. Not pale.     Left Turbinates: Enlarged and swollen. Not pale.     Right Sinus: Maxillary sinus tenderness and frontal sinus tenderness present.     Left Sinus: Maxillary sinus tenderness and frontal sinus tenderness present.     Mouth/Throat:     Lips: Pink. No lesions.     Mouth: Mucous membranes are moist. Mucous membranes are not pale, not dry and not cyanotic. No lacerations, oral lesions or angioedema.     Dentition: No dental abscesses or gum lesions.     Tongue: No lesions. Tongue does  not deviate from midline.     Palate: No mass and lesions.     Pharynx: Uvula midline. Pharyngeal swelling and posterior oropharyngeal erythema present. No oropharyngeal exudate or uvula  swelling.     Tonsils: No tonsillar exudate or tonsillar abscesses. 1+ on the right. 1+ on the left.     Comments: Cobblestoning posterior pharynx; bilateral allergic shiners; clear discharge bilateral nasal turbinates with edema/erythema; bilateral TMs air fluid level clear; nasal sniffing/congestion audible in exam room; ttp bridge of nose and maxillary sinuses greater than frontal sinuses; tonsils 1+/4 erythema/edema Eyes:     General: Lids are normal. Vision grossly intact. Gaze aligned appropriately. Allergic shiner present. No scleral icterus.       Right eye: No foreign body, discharge or hordeolum.        Left eye: No foreign body, discharge or hordeolum.     Extraocular Movements: Extraocular movements intact.     Right eye: Normal extraocular motion and no nystagmus.     Left eye: Normal extraocular motion and no nystagmus.     Conjunctiva/sclera: Conjunctivae normal.     Right eye: Right conjunctiva is not injected. No chemosis, exudate or hemorrhage.    Left eye: Left conjunctiva is not injected. No chemosis, exudate or hemorrhage.    Pupils: Pupils are equal, round, and reactive to light. Pupils are equal.     Right eye: Pupil is round and reactive.     Left eye: Pupil is round and reactive.  Neck:     Thyroid: No thyroid mass or thyromegaly.     Trachea: Trachea and phonation normal. No tracheal tenderness or tracheal deviation.  Cardiovascular:     Rate and Rhythm: Normal rate and regular rhythm.     Pulses: Normal pulses.          Radial pulses are 2+ on the right side and 2+ on the left side.     Heart sounds: Normal heart sounds, S1 normal and S2 normal. Heart sounds not distant. No murmur heard. Pulmonary:     Effort: Pulmonary effort is normal. No accessory muscle usage or respiratory distress.     Breath sounds: Normal breath sounds and air entry. No stridor, decreased air movement or transmitted upper airway sounds. No decreased breath sounds, wheezing, rhonchi or  rales.     Comments: Spoke full sentences without difficulty; no cough observed in exam room; wearing cloth mask Chest:     Chest wall: No tenderness.  Abdominal:     General: There is no distension.     Palpations: Abdomen is soft.  Musculoskeletal:        General: No tenderness. Normal range of motion.     Right shoulder: Normal.     Left shoulder: Normal.     Right hand: Normal.     Left hand: Normal.     Cervical back: Normal range of motion and neck supple. No swelling, edema, deformity, erythema, signs of trauma, lacerations, rigidity, torticollis, tenderness or crepitus. No pain with movement. Normal range of motion.     Right hip: Normal.     Left hip: Normal.     Right knee: Normal.     Left knee: Normal.     Right lower leg: No edema.     Left lower leg: No edema.  Lymphadenopathy:     Head:     Right side of head: No submental, submandibular, tonsillar, preauricular, posterior auricular or occipital adenopathy.     Left side of head:  No submental, submandibular, tonsillar, preauricular, posterior auricular or occipital adenopathy.     Cervical: No cervical adenopathy.     Right cervical: No superficial, deep or posterior cervical adenopathy.    Left cervical: No superficial, deep or posterior cervical adenopathy.  Skin:    General: Skin is warm and dry.     Capillary Refill: Capillary refill takes less than 2 seconds.     Coloration: Skin is not ashen, cyanotic, jaundiced, mottled, pale or sallow.     Findings: No abrasion, abscess, acne, bruising, burn, ecchymosis, erythema, signs of injury, laceration, lesion, petechiae, rash or wound.     Nails: There is no clubbing.  Neurological:     Mental Status: She is alert and oriented to person, place, and time. She is not disoriented.     GCS: GCS eye subscore is 4. GCS verbal subscore is 5. GCS motor subscore is 6.     Cranial Nerves: Cranial nerves 2-12 are intact. No cranial nerve deficit, dysarthria or facial asymmetry.      Sensory: Sensation is intact. No sensory deficit.     Motor: Motor function is intact. No weakness, tremor, atrophy, abnormal muscle tone or seizure activity.     Coordination: Coordination is intact. Coordination normal.     Gait: Gait is intact. Gait normal.     Comments: Gait sure and steady in clinic; in/out of chair and on/off exam table without difficulty; bilateral hand grasp equal 5/5  Psychiatric:        Attention and Perception: Attention and perception normal.        Mood and Affect: Mood and affect normal.        Speech: Speech normal.        Behavior: Behavior normal. Behavior is cooperative.        Thought Content: Thought content normal.        Cognition and Memory: Cognition and memory normal.        Judgment: Judgment normal.          Assessment & Plan:  A-acute rhinosinusitis, elevated blood pressure  P-Continue allergy medications daily.  Increase frequently of nasal saline use 2 sprays each nostril q2h while awake.  Shower after work/being outside.  Home covid test was negative this weekend.  Symptoms worsening after walking outside most likely allergic rhinitis flare.  Discussed viral/allergic mucous will be clear/clear yellow/green and antibiotics do not help. If fever, cloudy/opaque brown/pink mucous from sinuses start cephalexin 500mg  po BID x 10 days #30 RF0 dispensed from [facility] to patient today. May continue tylenol cold/sinus per manufacturer's instructions.   flonase 50mcg 1 spray each nostril BID  Patient denied personal or family history of ENT cancer.  OTC antihistamine of choice claritin/zyrtec 10mg  po daily.  Singulair 10mg  po qhs. Avoid triggers if possible.  Shower prior to bedtime if exposed to triggers.  If allergic dust/dust mites recommend mattress/pillow covers/encasements; washing linens, vacuuming, sweeping, dusting weekly.  Call or return to clinic as needed if these symptoms worsen or fail to improve as anticipated.  Patient refused printed AVS.   Patient verbalized understanding of instructions, agreed with plan of care and had no further questions at this time.  P2:  Avoidance and hand washing.   Acute illness and OTC cough/cold medicine.  See RN Hildred Alamin next week to repeat blood pressure.  Avoid caffeine intake more than 8 oz per day.  Discussed ER if chest pain, worst headache of life, dyspnea or visual changes for re-evaluation.  Patient verbalized  understanding information/instructions, agreed with plan of care and had no further questions at this time.

## 2021-06-14 ENCOUNTER — Ambulatory Visit: Payer: Self-pay | Admitting: *Deleted

## 2021-06-14 VITALS — BP 143/96

## 2021-06-14 DIAGNOSIS — R030 Elevated blood-pressure reading, without diagnosis of hypertension: Secondary | ICD-10-CM

## 2021-06-14 NOTE — Progress Notes (Signed)
Reviewed BP reduction strategies such as stress reduction, exercise and weight loss, caffeine intake reduction. Pt endorses drinking 40oz coffee each morning. Sts she will work on reducing the volume of coffee intake. Also can move to half decaf.  ?Is increasing activity outside of work as well. Walking long distances or [place], 2 miles in 23 min, etc.  ?

## 2021-12-04 DIAGNOSIS — J3089 Other allergic rhinitis: Secondary | ICD-10-CM | POA: Insufficient documentation

## 2021-12-04 DIAGNOSIS — H9311 Tinnitus, right ear: Secondary | ICD-10-CM | POA: Insufficient documentation

## 2021-12-25 ENCOUNTER — Ambulatory Visit: Payer: No Typology Code available for payment source

## 2021-12-25 DIAGNOSIS — Z23 Encounter for immunization: Secondary | ICD-10-CM

## 2022-04-02 DIAGNOSIS — D123 Benign neoplasm of transverse colon: Secondary | ICD-10-CM | POA: Insufficient documentation

## 2022-04-08 ENCOUNTER — Ambulatory Visit: Payer: Self-pay | Admitting: Occupational Medicine

## 2022-04-08 DIAGNOSIS — H579 Unspecified disorder of eye and adnexa: Secondary | ICD-10-CM

## 2022-04-08 NOTE — Progress Notes (Addendum)
Patient complains of itchy and red eye after cat sits on lap. Patient on OTC allergies meds daily.  Encourage to get OTC allergy eye drops. Could try benadryl at night. Ice pack on eyes. If continues could talk to PCP about seeing allergy doctor. Given Ice pack and refresh eye drops at this time. Patient request Benadryl to take now reports doesn't make sleepy. Noted couple red bumps to forehead. If continues to bother her to let me know and we will schedule appt with Otila Kluver NP.

## 2022-06-11 ENCOUNTER — Telehealth: Payer: Self-pay | Admitting: Registered Nurse

## 2022-06-11 ENCOUNTER — Encounter: Payer: Self-pay | Admitting: Registered Nurse

## 2022-06-11 DIAGNOSIS — E039 Hypothyroidism, unspecified: Secondary | ICD-10-CM

## 2022-06-11 DIAGNOSIS — Z0000 Encounter for general adult medical examination without abnormal findings: Secondary | ICD-10-CM

## 2022-06-11 DIAGNOSIS — E785 Hyperlipidemia, unspecified: Secondary | ICD-10-CM

## 2022-06-11 DIAGNOSIS — K219 Gastro-esophageal reflux disease without esophagitis: Secondary | ICD-10-CM

## 2022-06-11 MED ORDER — LEVOTHYROXINE SODIUM 100 MCG PO TABS: 100.0000 ug | ORAL_TABLET | Freq: Every day | ORAL | 1 refills | Status: DC

## 2022-06-11 MED ORDER — OMEPRAZOLE 40 MG PO CPDR: 40.0000 mg | DELAYED_RELEASE_CAPSULE | Freq: Every day | ORAL | 1 refills | Status: DC

## 2022-06-11 MED ORDER — LEVOTHYROXINE SODIUM 50 MCG PO TABS: 50.0000 ug | ORAL_TABLET | Freq: Every day | ORAL | 1 refills | Status: DC

## 2022-06-11 MED ORDER — SIMVASTATIN 40 MG PO TABS: 40.0000 mg | ORAL_TABLET | Freq: Every day | ORAL | 1 refills | Status: DC

## 2022-06-11 NOTE — Telephone Encounter (Signed)
Patient last filled 90 day supply 03/07/2022.  Last labs 12/04/2021  Be Well 2025 labs will be due prior to end of July ordered and patient to schedule with RN Kimrey.  90 day supply levothyroxine 149mcg po daily; simvastatin 40mg  po daily and omeprazole DR 40mg  po daily #90 RF1 each  Patient verbalized understanding information/instructions and had no further questions at this time.

## 2022-08-28 ENCOUNTER — Telehealth: Payer: Self-pay | Admitting: Registered Nurse

## 2022-08-28 ENCOUNTER — Encounter: Payer: Self-pay | Admitting: Registered Nurse

## 2022-08-28 DIAGNOSIS — R638 Other symptoms and signs concerning food and fluid intake: Secondary | ICD-10-CM

## 2022-08-28 DIAGNOSIS — E871 Hypo-osmolality and hyponatremia: Secondary | ICD-10-CM

## 2022-08-28 DIAGNOSIS — R7303 Prediabetes: Secondary | ICD-10-CM

## 2022-08-28 DIAGNOSIS — Z0000 Encounter for general adult medical examination without abnormal findings: Secondary | ICD-10-CM

## 2022-08-28 NOTE — Telephone Encounter (Signed)
Epic reviewed last labs sep 2023; fasting female executive panel, hgba1c and vitamin D ordered for patient

## 2022-09-04 ENCOUNTER — Other Ambulatory Visit: Payer: No Typology Code available for payment source | Admitting: Occupational Medicine

## 2022-09-05 ENCOUNTER — Other Ambulatory Visit: Payer: Self-pay | Admitting: Occupational Medicine

## 2022-09-05 DIAGNOSIS — Z0000 Encounter for general adult medical examination without abnormal findings: Secondary | ICD-10-CM

## 2022-09-05 DIAGNOSIS — K219 Gastro-esophageal reflux disease without esophagitis: Secondary | ICD-10-CM

## 2022-09-05 NOTE — Progress Notes (Signed)
Lab drawn from [facility] tolerated well no issues noted.   

## 2022-09-06 ENCOUNTER — Encounter: Payer: Self-pay | Admitting: Registered Nurse

## 2022-09-06 LAB — CMP12+LP+TP+TSH+6AC+CBC/D/PLT
ALT: 25 IU/L (ref 0–32)
AST: 20 IU/L (ref 0–40)
Albumin/Globulin Ratio: 1.9
Albumin: 4.3 g/dL (ref 3.9–4.9)
Alkaline Phosphatase: 87 IU/L (ref 44–121)
BUN/Creatinine Ratio: 20 (ref 12–28)
BUN: 16 mg/dL (ref 8–27)
Basophils Absolute: 0.1 10*3/uL (ref 0.0–0.2)
Basos: 1 %
Bilirubin Total: 0.4 mg/dL (ref 0.0–1.2)
Calcium: 9.2 mg/dL (ref 8.7–10.3)
Chloride: 96 mmol/L (ref 96–106)
Chol/HDL Ratio: 3.1 ratio (ref 0.0–4.4)
Cholesterol, Total: 156 mg/dL (ref 100–199)
Creatinine, Ser: 0.8 mg/dL (ref 0.57–1.00)
EOS (ABSOLUTE): 0.3 10*3/uL (ref 0.0–0.4)
Eos: 4 %
Estimated CHD Risk: <0.5 times avg. (ref 0.0–1.0)
Free Thyroxine Index: 2 (ref 1.2–4.9)
GGT: 20 IU/L (ref 0–60)
Globulin, Total: 2.3 g/dL (ref 1.5–4.5)
Glucose: 86 mg/dL (ref 70–99)
HDL: 50 mg/dL (ref >39)
Hematocrit: 41.2 % (ref 34.0–46.6)
Hemoglobin: 13.3 g/dL (ref 11.1–15.9)
Immature Grans (Abs): 0 10*3/uL (ref 0.0–0.1)
Immature Granulocytes: 0 %
Iron: 150 ug/dL — ABNORMAL HIGH (ref 27–139)
LDH: 223 IU/L (ref 119–226)
LDL Chol Calc (NIH): 87 mg/dL (ref 0–99)
Lymphocytes Absolute: 1.5 10*3/uL (ref 0.7–3.1)
Lymphs: 24 %
MCH: 30.4 pg (ref 26.6–33.0)
MCHC: 32.3 g/dL (ref 31.5–35.7)
MCV: 94 fL (ref 79–97)
Monocytes Absolute: 0.6 10*3/uL (ref 0.1–0.9)
Monocytes: 9 %
Neutrophils Absolute: 4.1 10*3/uL (ref 1.4–7.0)
Neutrophils: 62 %
Phosphorus: 4.8 mg/dL — ABNORMAL HIGH (ref 3.0–4.3)
Platelets: 228 10*3/uL (ref 150–450)
Potassium: 4.9 mmol/L (ref 3.5–5.2)
RBC: 4.38 x10E6/uL (ref 3.77–5.28)
RDW: 11.9 % (ref 11.7–15.4)
Sodium: 130 mmol/L — ABNORMAL LOW (ref 134–144)
T3 Uptake Ratio: 26 % (ref 24–39)
T4, Total: 7.7 ug/dL (ref 4.5–12.0)
TSH: 1.64 u[IU]/mL (ref 0.450–4.500)
Total Protein: 6.6 g/dL (ref 6.0–8.5)
Triglycerides: 107 mg/dL (ref 0–149)
Uric Acid: 4.7 mg/dL (ref 3.0–7.2)
VLDL Cholesterol Cal: 19 mg/dL (ref 5–40)
WBC: 6.6 10*3/uL (ref 3.4–10.8)
eGFR: 81 mL/min/{1.73_m2} (ref >59)

## 2022-09-06 LAB — HEMOGLOBIN A1C
Est. average glucose Bld gHb Est-mCnc: 123 mg/dL
Hgb A1c MFr Bld: 5.9 % — ABNORMAL HIGH (ref 4.8–5.6)

## 2022-09-06 LAB — MAGNESIUM: Magnesium: 1.9 mg/dL (ref 1.6–2.3)

## 2022-09-06 LAB — VITAMIN D 25 HYDROXY (VIT D DEFICIENCY, FRACTURES): Vit D, 25-Hydroxy: 44.5 ng/mL (ref 30.0–100.0)

## 2022-09-06 NOTE — Telephone Encounter (Signed)
See lab results note elevated iron, phosphorus, hgba1c and low sodium.  Repeat bmet and iron in 3 months and hgba1c in 6 months.

## 2022-09-08 NOTE — Progress Notes (Signed)
Noted patient has completed follow up labs

## 2022-09-11 ENCOUNTER — Ambulatory Visit: Payer: No Typology Code available for payment source | Admitting: Occupational Medicine

## 2022-09-11 VITALS — BP 118/80 | Ht 65.0 in | Wt 179.5 lb

## 2022-09-11 DIAGNOSIS — Z0000 Encounter for general adult medical examination without abnormal findings: Secondary | ICD-10-CM

## 2022-09-11 DIAGNOSIS — R7303 Prediabetes: Secondary | ICD-10-CM

## 2022-09-11 DIAGNOSIS — R638 Other symptoms and signs concerning food and fluid intake: Secondary | ICD-10-CM

## 2022-09-11 NOTE — Progress Notes (Signed)
Be well insurance premium discount evaluation: Met  Epic reviewed by RN Kimrey transcribed labs and reviewed with the patient. Scheduled follow up labs.   Tobacco attestation signed. [facility] ROI formed signed. Forms placed in the chart.   Patient given handouts for [facility] pharmacies and discount drugs list, MyChart, [facility], [facility], [place] [facility] and [facility].  What to do for infectious illness protocol. Given handout for list of medications that can be filled at [facility]. Given Clinic hours and Clinic Email.  

## 2022-09-11 NOTE — Progress Notes (Signed)
Noted RN Kimrey notified to have patient take picture of her multivitamin RDA/ingredient label and sent to [email or web address]

## 2022-09-18 NOTE — Progress Notes (Signed)
Patient reviewed results with RN Bess Kinds and signed Be Well paperwork.  I signed provider Be Well 2025 paperwork for patient 09/12/22

## 2022-10-14 NOTE — Progress Notes (Signed)
RN Kimrey followed up with patient last month stated she drinks a lot of water typically tap water and takes multivitamin  Recommended stop vitamin if iron in it and recheck level in 3 months after changes.

## 2022-10-14 NOTE — Progress Notes (Signed)
Patient taking multivitamin and drinking tap water.  Recommended stop multivitamin if it contains iron and recheck blood level 3 months after dietary changes.  RN Bess Kinds has lab appts scheduled 12/11/22 and 03/05/23 with patient.

## 2022-12-11 ENCOUNTER — Other Ambulatory Visit: Payer: No Typology Code available for payment source

## 2023-01-02 ENCOUNTER — Telehealth: Payer: Self-pay | Admitting: Registered Nurse

## 2023-01-02 ENCOUNTER — Encounter: Payer: Self-pay | Admitting: Registered Nurse

## 2023-01-02 DIAGNOSIS — E871 Hypo-osmolality and hyponatremia: Secondary | ICD-10-CM

## 2023-01-02 DIAGNOSIS — R7303 Prediabetes: Secondary | ICD-10-CM

## 2023-01-02 DIAGNOSIS — R638 Other symptoms and signs concerning food and fluid intake: Secondary | ICD-10-CM

## 2023-01-02 NOTE — Telephone Encounter (Signed)
Overdue for Be Well lab follow up orders expired extended and my chart message reminder sent to patient to schedule with [facility] or PCM

## 2023-01-13 ENCOUNTER — Ambulatory Visit: Payer: Self-pay

## 2023-01-13 DIAGNOSIS — Z23 Encounter for immunization: Secondary | ICD-10-CM

## 2023-01-30 ENCOUNTER — Telehealth: Payer: Self-pay | Admitting: Registered Nurse

## 2023-01-30 DIAGNOSIS — R638 Other symptoms and signs concerning food and fluid intake: Secondary | ICD-10-CM

## 2023-01-30 DIAGNOSIS — E039 Hypothyroidism, unspecified: Secondary | ICD-10-CM

## 2023-01-30 NOTE — Telephone Encounter (Signed)
Patient had labs drawn at [facility] office.  Reviewed with patient no iron level drawn will recheck later this month.  Patient notified TSH elevated and to contact her PCM for further instructions  CMET already drawn no need to repeat normal order cancelled.  Hgba1c normal  Patient verbalized understanding stated she would call PCM office regarding TSH discussed normally test repeated or medication adjusted patient had no further questions at this time.  Will have iron drawn when next TSH drawn.  Discussed this is nonfasting lab.  Component Ref Range & Units 7 d ago  TSH 0.450 - 4.50 uIU/mL 15.600 High   Resulting Agency [facility]  Narrative Performed by [facility] Performed at:  [address] [facility] [address], [place], [place]  [id number] Lab Director: Jolene Schimke MD, Phone:  [phone] Specimen Collected: 01/23/23 09:40   Performed by: Verdell Carmine Last Resulted: 01/24/23 05:35  Received From: [facility]  Result Received: 01/29/23 09:22   Component Ref Range & Units 7 d ago Comments  Cholesterol, Total 100 - 199 mg/dL 865   Triglycerides 0 - 149 mg/dL 784   HDL >69 mg/dL 57   VLDL Cholesterol Cal 5 - 40 mg/dL 21   LDL 0 - 99 mg/dL 629 High    LDL/HDL Ratio 0.0 - 3.2 ratio 1.8                                     LDL/HDL Ratio                                             Men  Women                               1/2 Avg.Risk  1.0    1.5                                   Avg.Risk  3.6    3.2                                2X Avg.Risk  6.2    5.0                                3X Avg.Risk  8.0    6.1  Resulting Agency [facility]   Narrative Performed by [facility] Performed at:  [address] [facility] [address], [place], [place]  [id number] Lab Director: Jolene Schimke MD, Phone:  [phone] Specimen Collected: 01/23/23 09:40   Performed by: Verdell Carmine Last Resulted: 01/24/23 05:35  Received From: [facility]  Result Received: 01/29/23 09:22  Specimen: Blood Component Ref  Range & Units 7 d ago  Glucose 70 - 99 mg/dL 84  BUN 8 - 27 mg/dL 12  Creatinine 4.40 - 3.47 mg/dL 4.25  eGFR >95 GL/OVF/6.43 86  BUN/Creatinine Ratio 12 - 28 16  Sodium 134 - 144 mmol/L 133 Low   Potassium 3.5 - 5.2 mmol/L 4.9  Chloride 96 - 106 mmol/L 97  CO2 20 - 29 mmol/L 22  CALCIUM 8.7 -  10.3 mg/dL 9.5  Total Protein 6.0 - 8.5 g/dL 6.7  Albumin, Serum 3.9 - 4.9 g/dL 4.2  Globulin, Total 1.5 - 4.5 g/dL 2.5  Total Bilirubin 0.0 - 1.2 mg/dL 0.3  Alkaline Phosphatase 44 - 121 IU/L 90  AST 0 - 40 IU/L 25  ALT (SGPT) 0 - 32 IU/L 28  Resulting Agency [facility]  Narrative Performed by [facility] Performed at:  [address] - [facility] [address] [place], [place]  [id number] Lab Director: Jolene Schimke MD, Phone:  [phone] Specimen Collected: 01/23/23 09:40   Performed by: Verdell Carmine Last Resulted: 01/24/23 05:35  Received From: [facility]  Result Received: 01/29/23 09:22  Specimen: Blood Component Ref Range & Units 7 d ago  Hemoglobin A1c 4.8 - 5.6 % 5.6  Resulting Agency [facility]  Specimen Collected: 01/23/23 09:31   Performed by: Verdell Carmine Last Resulted: 01/23/23 10:40  Received From: [facility]  Result Received: 01/29/23 09:22   Component Ref Range & Units 7 d ago  WBC 3.7 - 11.0 thou/mcL 6.8  RBC 4.01 - 4.90 million/mcL 4.35  HGB 12.2 - 14.9 gm/dL 25.3  HCT 66.4 - 40.3 % 39.5  MCV 82.0 - 98.0 fL 90.9  MCH 27.0 - 33.0 pg 32.1  MCHC 31.0 - 37.0 gm/dL 47.4  Plt Ct 259 - 563 thou/mcL 252  RDW CV 11.8 - 14.9 % 13.3  NEUTROPHIL % % 73.9  LYMPHOCYTE % % 21.0  MID% % 5.1  ABSOLUTE NEUTROPHIL COUNT 1.50 - 7.50 thou/mcL 5.00  ABSOLUTE LYMPHOCYTE COUNT 1.00 - 4.50 thou/mcL 1.40  ABSOLUTE MID 0.1 - 1.5 thou/mcL <0.5  Resulting Agency [facility]  Specimen Collected: 01/23/23 09:31   Performed by: [facility] Last Resulted: 01/23/23 09:50  Received From: [facility]  Result Received: 01/29/23  09:22

## 2023-02-05 NOTE — Telephone Encounter (Signed)
Patient stated had labs drawn at [facility] and will follow up with [facility]

## 2023-03-04 ENCOUNTER — Telehealth: Payer: Self-pay | Admitting: Registered Nurse

## 2023-03-04 ENCOUNTER — Encounter: Payer: Self-pay | Admitting: Registered Nurse

## 2023-03-04 DIAGNOSIS — E039 Hypothyroidism, unspecified: Secondary | ICD-10-CM

## 2023-03-04 DIAGNOSIS — E785 Hyperlipidemia, unspecified: Secondary | ICD-10-CM

## 2023-03-04 DIAGNOSIS — K219 Gastro-esophageal reflux disease without esophagitis: Secondary | ICD-10-CM

## 2023-03-04 MED ORDER — LEVOTHYROXINE SODIUM 100 MCG PO TABS: 150.0000 ug | ORAL_TABLET | Freq: Every day | ORAL | 3 refills | Status: DC

## 2023-03-04 MED ORDER — OMEPRAZOLE 40 MG PO CPDR: 40.0000 mg | DELAYED_RELEASE_CAPSULE | Freq: Every day | ORAL | 0 refills | Status: DC

## 2023-03-04 MED ORDER — SIMVASTATIN 20 MG PO TABS: 40.0000 mg | ORAL_TABLET | Freq: Every day | ORAL | Status: DC

## 2023-03-04 NOTE — Telephone Encounter (Signed)
Patient last filled 90 day supply 09/03/2022.  levothyroxine po daily sig t1.5 tabs po daily #150 RF3; simvastatin 20mg  sig t2 po daily #180 RF3 and omeprazole DR 40mg  po daily #90 RF0 each  Patient verbalized understanding information/instructions and had no further questions at this time.  Had labs with [facility] 01/23/23 Discussed with patient TSH elevated usually recommend repeat lab or dose adjustment on 01/30/23 to contact her PCM office to ensure if dose change/lab follow up.  Patient brought rx from [facility] for her chronic medications available at [facility]. Glucose 70 - 99 mg/dL 84  BUN 8 - 27 mg/dL 12  Creatinine 8.84 - 1.66 mg/dL 0.63  eGFR >01 SW/FUX/3.23 86  BUN/Creatinine Ratio 12 - 28 16  Sodium 134 - 144 mmol/L 133 Low   Potassium 3.5 - 5.2 mmol/L 4.9  Chloride 96 - 106 mmol/L 97  CO2 20 - 29 mmol/L 22  CALCIUM 8.7 - 10.3 mg/dL 9.5  Total Protein 6.0 - 8.5 g/dL 6.7  Albumin, Serum 3.9 - 4.9 g/dL 4.2  Globulin, Total 1.5 - 4.5 g/dL 2.5  Total Bilirubin 0.0 - 1.2 mg/dL 0.3  Alkaline Phosphatase 44 - 121 IU/L 90  AST 0 - 40 IU/L 25  ALT (SGPT) 0 - 32 IU/L 28  Resulting Agency [facility]   Component Ref Range & Units 1 mo ago Comments  Cholesterol, Total 100 - 199 mg/dL 557   Triglycerides 0 - 149 mg/dL 322   HDL >02 mg/dL 57   VLDL Cholesterol Cal 5 - 40 mg/dL 21   LDL 0 - 99 mg/dL 542 High    LDL/HDL Ratio 0.0 - 3.2 ratio 1.8                                     LDL/HDL Ratio                                             Men  Women                               1/2 Avg.Risk  1.0    1.5                                   Avg.Risk  3.6    3.2                                2X Avg.Risk  6.2    5.0                                3X Avg.Risk  8.0    6.1  Resulting Agency [facility]   Narrative Performed by [facility] Performed at:  [address] [facility] [address] [place], [place]  [id number] Lab Director: Jolene Schimke MD, Phone:   [phone] Specimen Collected: 01/23/23 09:40   Performed by: Verdell Carmine Last Resulted: 01/24/23 05:35  Received From: [facility]  Result Received: 01/29/23 09:22  Specimen: Blood Component Ref Range & Units 1 mo ago  TSH 0.450 -  4.50 uIU/mL 15.600 High   Resulting Agency [facility]  Narrative Performed by [facility] Performed at:  [address] [facility] [address] [place], [place]  [id number] Lab Director: Jolene Schimke MD, Phone:  [phone] Specimen Collected: 01/23/23 09:40   Performed by: Verdell Carmine Last Resulted: 01/24/23 05:35  Received From: [facility]  Result Received: 01/29/23 09:22  Specimen: Blood Component Ref Range & Units 1 mo ago  Hemoglobin A1c 4.8 - 5.6 % 5.6  Resulting Agency [facility]  Specimen Collected: 01/23/23 09:31   Performed by: Verdell Carmine Last Resulted: 01/23/23 10:40  Received From: [facility]  Result Received: 01/29/23 09:22   Component Ref Range & Units 1 mo ago  WBC 3.7 - 11.0 thou/mcL 6.8  RBC 4.01 - 4.90 million/mcL 4.35  HGB 12.2 - 14.9 gm/dL 82.9  HCT 56.2 - 13.0 % 39.5  MCV 82.0 - 98.0 fL 90.9  MCH 27.0 - 33.0 pg 32.1  MCHC 31.0 - 37.0 gm/dL 86.5  Plt Ct 784 - 696 thou/mcL 252  RDW CV 11.8 - 14.9 % 13.3  NEUTROPHIL % % 73.9  LYMPHOCYTE % % 21.0  MID% % 5.1  ABSOLUTE NEUTROPHIL COUNT 1.50 - 7.50 thou/mcL 5.00  ABSOLUTE LYMPHOCYTE COUNT 1.00 - 4.50 thou/mcL 1.40  ABSOLUTE MID 0.1 - 1.5 thou/mcL <0.5  Resulting Agency [facility]  Specimen Collected: 01/23/23 09:31   Performed by: [facility] Last Resulted: 01/23/23 09:50  Received From: [facility]  Result Received: 01/29/23 09:22  Specimen: Blood - Vein specimen (specimen) Component Ref Range & Units 1 yr ago  TSH 0.450 - 4.500 uIU/mL 0.734  Free T4 0.82 - 1.77 ng/dL 2.95  Resulting Agency [facility]  Narrative Performed by Verdell Carmine Performed at:  [address] [facility]  [address] [place], [place]   [id number] Lab Director: Jolene Schimke MD, Phone:  [phone] Specimen Collected: 12/04/21 09:23   Performed by: Verdell Carmine Last Resulted: 12/05/21 02:35  Received From: [facility]  Result Received: 12/25/21 15:32

## 2023-03-05 ENCOUNTER — Other Ambulatory Visit: Payer: No Typology Code available for payment source

## 2023-03-06 ENCOUNTER — Telehealth: Payer: Self-pay | Admitting: Registered Nurse

## 2023-03-06 DIAGNOSIS — E039 Hypothyroidism, unspecified: Secondary | ICD-10-CM

## 2023-03-06 DIAGNOSIS — Z0000 Encounter for general adult medical examination without abnormal findings: Secondary | ICD-10-CM

## 2023-03-06 DIAGNOSIS — R638 Other symptoms and signs concerning food and fluid intake: Secondary | ICD-10-CM

## 2023-03-06 NOTE — Telephone Encounter (Signed)
See tcon dated 03/06/23

## 2023-03-20 NOTE — Telephone Encounter (Signed)
Discussed with patient lab results again today and she stated she found out she had been taking her levothyroxine pill incorrectly only 1 instead of 1.5.  Restarted 1.5 tabs about a month ago.  Discussed with patient will recheck TSH with iron level in 8 weeks from today.  Patient stated has been drinking unsweet tea from [employer] daily.  Trying to cut down on mountain dew intake and coffee intake as she doesn't need as much sugar and caffeine in her life.  Discussed tea sometimes can elevate iron levels.  Patient agreed with repeat labs in 2 months iron and TSH  Patient verbalized understanding information/instructions, agreed with plan of care and had no further questions at this time.

## 2023-04-22 ENCOUNTER — Other Ambulatory Visit: Payer: Self-pay | Admitting: Physician Assistant

## 2023-04-22 DIAGNOSIS — Z1231 Encounter for screening mammogram for malignant neoplasm of breast: Secondary | ICD-10-CM

## 2023-04-23 ENCOUNTER — Other Ambulatory Visit: Payer: Self-pay | Admitting: Physician Assistant

## 2023-04-23 ENCOUNTER — Ambulatory Visit
Admission: RE | Admit: 2023-04-23 | Discharge: 2023-04-23 | Discharge status: Home / Self Care | Payer: No Typology Code available for payment source | Source: Ambulatory Visit | Attending: Physician Assistant | Admitting: Physician Assistant

## 2023-04-23 DIAGNOSIS — Z1231 Encounter for screening mammogram for malignant neoplasm of breast: Secondary | ICD-10-CM

## 2023-06-17 ENCOUNTER — Encounter: Payer: Self-pay | Admitting: Registered Nurse

## 2023-06-17 ENCOUNTER — Telehealth: Payer: Self-pay | Admitting: Registered Nurse

## 2023-06-17 DIAGNOSIS — E039 Hypothyroidism, unspecified: Secondary | ICD-10-CM

## 2023-06-17 DIAGNOSIS — K219 Gastro-esophageal reflux disease without esophagitis: Secondary | ICD-10-CM

## 2023-06-17 DIAGNOSIS — E785 Hyperlipidemia, unspecified: Secondary | ICD-10-CM

## 2023-06-17 MED ORDER — LEVOTHYROXINE SODIUM 100 MCG PO TABS: 100.0000 ug | ORAL_TABLET | Freq: Every day | ORAL | Status: DC

## 2023-06-17 MED ORDER — OMEPRAZOLE 40 MG PO CPDR: 40.0000 mg | DELAYED_RELEASE_CAPSULE | Freq: Every day | ORAL | Status: DC

## 2023-06-17 MED ORDER — LEVOTHYROXINE SODIUM 50 MCG PO TABS: 50.0000 ug | ORAL_TABLET | Freq: Every day | ORAL | 2 refills | Status: DC

## 2023-06-17 MED ORDER — SALINE SPRAY 0.65 % NA SOLN: 2.0000 | NASAL | Status: DC

## 2023-06-17 NOTE — Telephone Encounter (Signed)
 Patient last fills 03/04/2023 90 day supply each medication.  Last labs Oct 2024 with PCM.  Needs TSH recheck after taking correct dose levothyroxine x  3 months ordered for patient.  Omeprazole 40mg  DR po daily #90 Rf0, levothyroxine po daily take with po daily #90 each dispensed to patient today and simvastatin 40mg  po daily #90.  Next fasting labs due Oct 2025 with PCM.

## 2023-06-17 NOTE — Telephone Encounter (Signed)
 [facility] Well 2026 labs completed Oct 2024 with PCM.  Patient notified to come to clinic to sign paperwork 06/17/23 labs met requirements for discount starting 24 Dec 2023.  Component Ref Range & Units 4 mo ago  Hemoglobin A1c 4.8 - 5.6 % 5.6  Resulting Agency [facility]  Specimen Collected: 01/23/23 09:31   Performed by: Verdell Carmine Last Resulted: 01/23/23 10:40  Received From: [facility]  Result Received: 01/29/23 09:22    Component Ref Range & Units 4 mo ago Comments  Cholesterol, Total 100 - 199 mg/dL 528   Triglycerides 0 - 149 mg/dL 413   HDL >24 mg/dL 57   VLDL Cholesterol Cal 5 - 40 mg/dL 21   LDL 0 - 99 mg/dL 401 High    LDL/HDL Ratio 0.0 - 3.2 ratio 1.8                                     LDL/HDL Ratio                                             Men  Women                               1/2 Avg.Risk  1.0    1.5                                   Avg.Risk  3.6    3.2                                2X Avg.Risk  6.2    5.0                                3X Avg.Risk  8.0    6.1  Resulting Agency [facility]   Narrative Performed by [facility] Performed at:  [address] [facility] [address], [place], [place]  [id number] Lab Director: Jolene Schimke MD, Phone:  [phone] Specimen Collected: 01/23/23 09:40   Performed by: Verdell Carmine Last Resulted: 01/24/23 05:35  Received From: [facility]  Result Received: 01/29/23 09:22

## 2023-08-23 IMAGING — MG MM DIGITAL SCREENING BILAT W/ TOMO AND CAD
8 series · 8 of 24 positions shown · non-contrast
Comparison: Previous exam(s).

CLINICAL DATA: Screening.

EXAM:
DIGITAL SCREENING BILATERAL MAMMOGRAM WITH TOMOSYNTHESIS AND CAD
TECHNIQUE: Bilateral screening digital craniocaudal and mediolateral oblique
mammograms were obtained. Bilateral screening digital breast
tomosynthesis was performed. The images were evaluated with
computer-aided detection.

[L CC synth-2D]
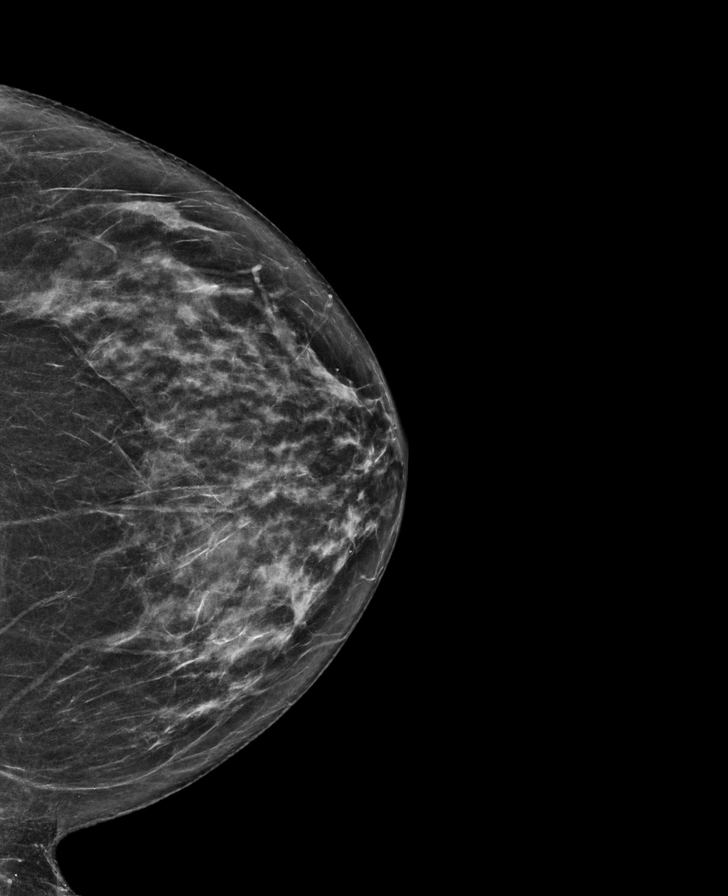

[L MLO synth-2D]
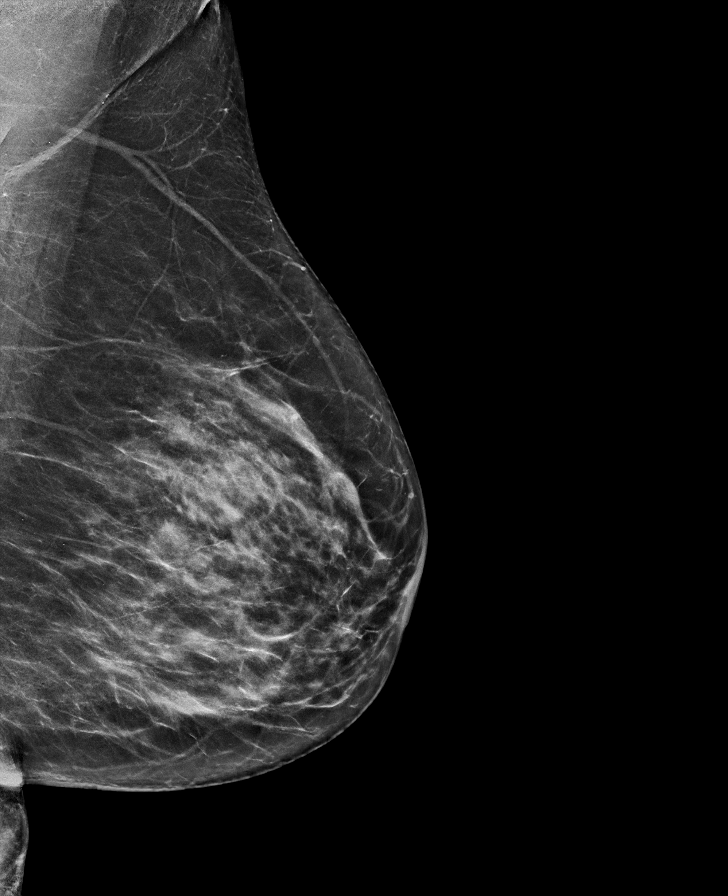

[R MLO synth-2D]
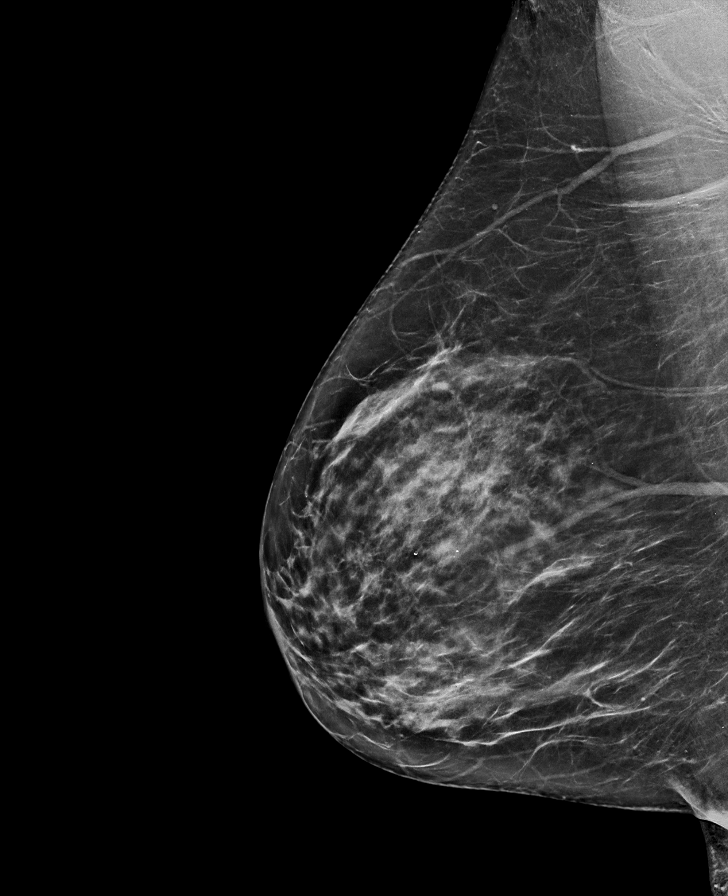

[R CC synth-2D]
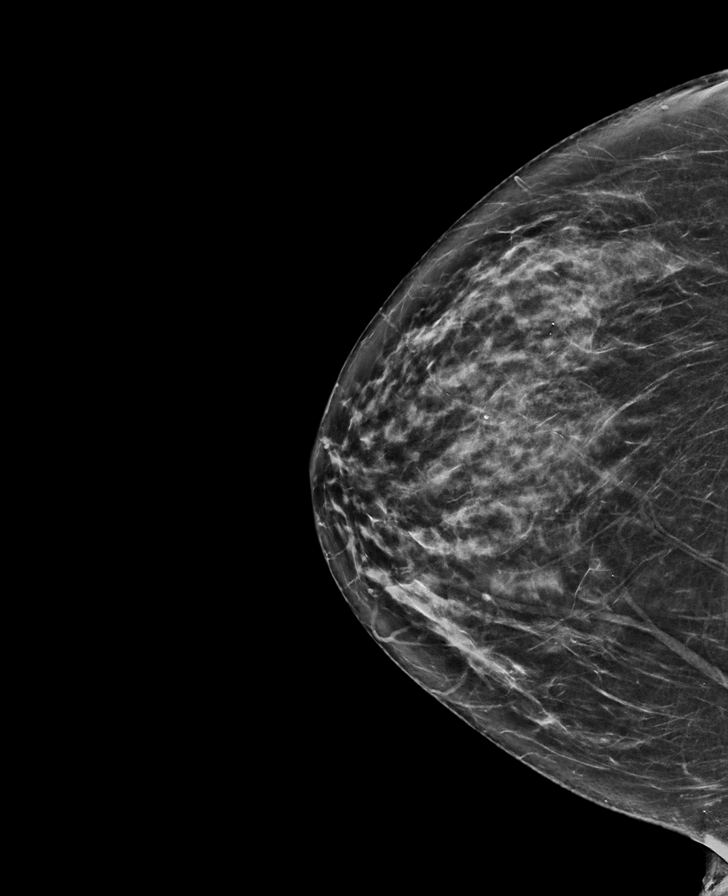

[R MLO tomo · tomo slice 37/74.0]
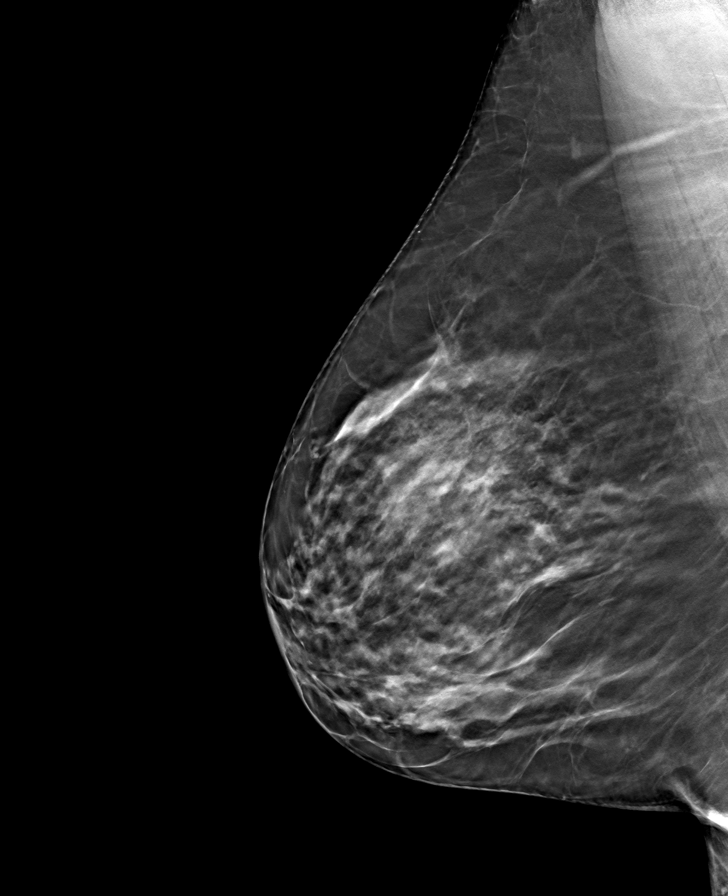

[L CC tomo · tomo slice 33/66.0]
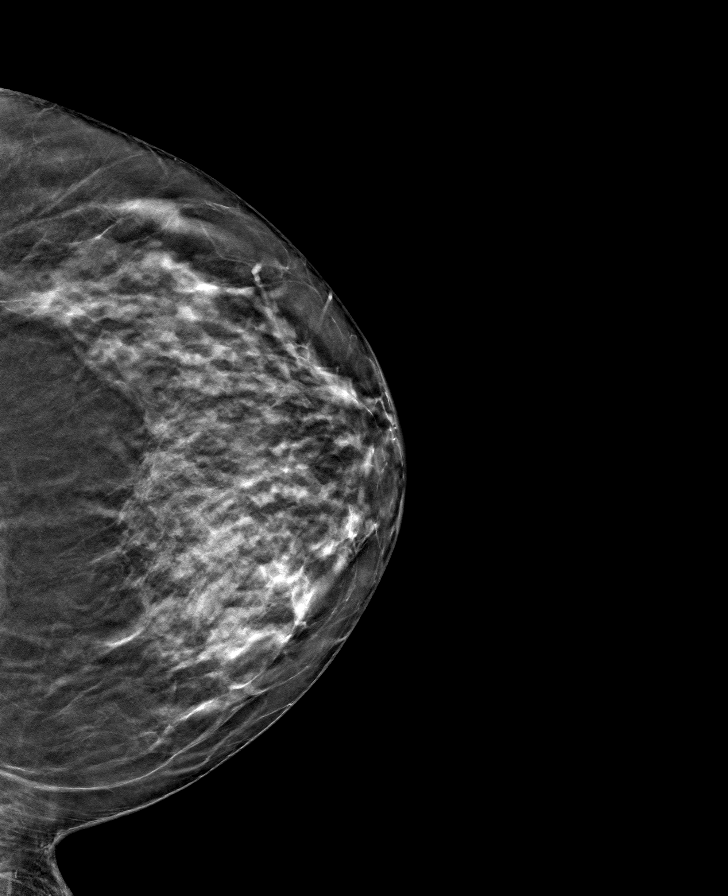

[R CC tomo · tomo slice 37/73.0]
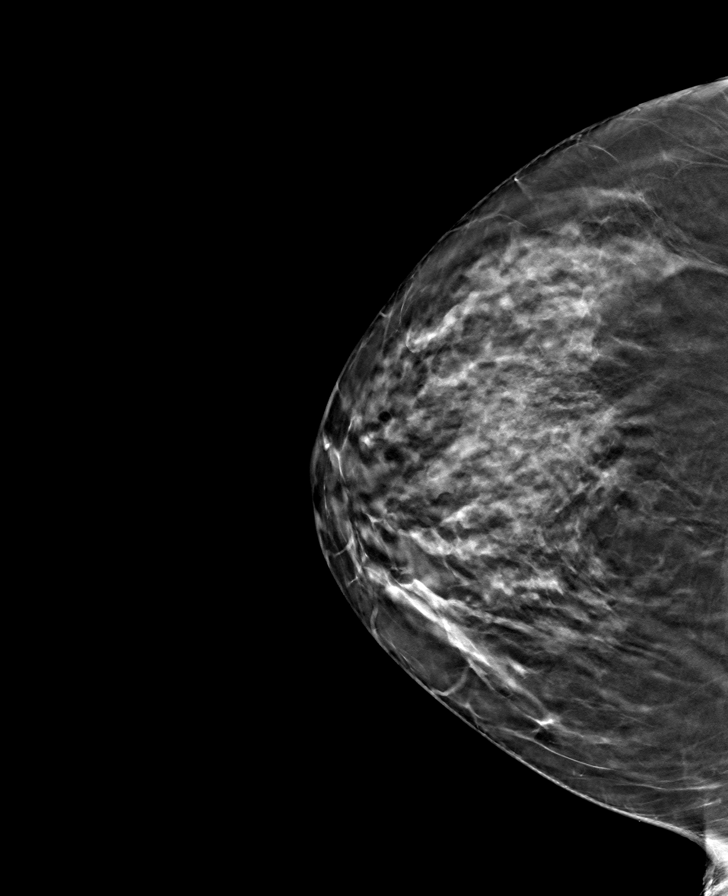

[L MLO tomo · tomo slice 36/71.0]
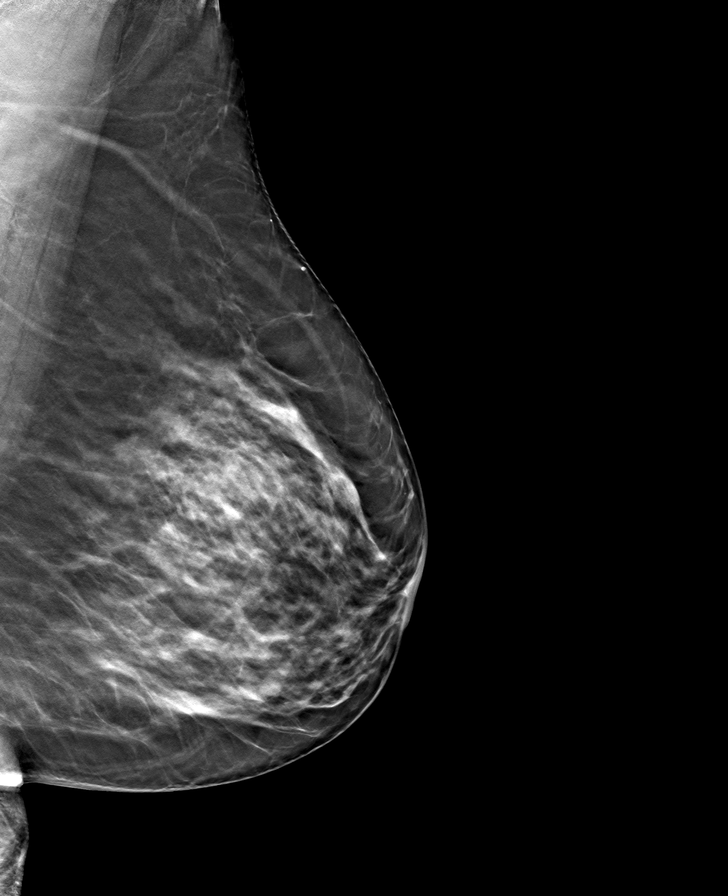

[8 of 24 positions shown; findings below may reference images not displayed]

ACR Breast Density Category c: The breast tissue is heterogeneously
dense, which may obscure small masses.
FINDINGS: There are no findings suspicious for malignancy.
IMPRESSION: No mammographic evidence of malignancy. A result letter of this
screening mammogram will be mailed directly to the patient.

RECOMMENDATION:
Screening mammogram in one year. (Code:[id number]-W-BC3)

BI-RADS CATEGORY  1: Negative.

## 2023-08-27 NOTE — Telephone Encounter (Signed)
 Patient signed paperwork and [employer] form given to HR [place] met requirements for insurance discount starting 24 Dec 2023

## 2023-09-04 ENCOUNTER — Telehealth: Payer: Self-pay | Admitting: Registered Nurse

## 2023-09-04 ENCOUNTER — Encounter: Payer: Self-pay | Admitting: Registered Nurse

## 2023-09-04 DIAGNOSIS — K219 Gastro-esophageal reflux disease without esophagitis: Secondary | ICD-10-CM

## 2023-09-04 DIAGNOSIS — E785 Hyperlipidemia, unspecified: Secondary | ICD-10-CM

## 2023-09-04 DIAGNOSIS — E039 Hypothyroidism, unspecified: Secondary | ICD-10-CM

## 2023-09-04 MED ORDER — LEVOTHYROXINE SODIUM 100 MCG PO TABS: 100.0000 ug | ORAL_TABLET | Freq: Every day | ORAL | 1 refills | Status: DC

## 2023-09-04 MED ORDER — SIMVASTATIN 40 MG PO TABS: 40.0000 mg | ORAL_TABLET | Freq: Every day | ORAL | 2 refills | Status: DC

## 2023-09-04 MED ORDER — SALINE SPRAY 0.65 % NA SOLN: 2.0000 | NASAL | Status: AC

## 2023-09-04 MED ORDER — LEVOTHYROXINE SODIUM 50 MCG PO TABS: 50.0000 ug | ORAL_TABLET | Freq: Every day | ORAL | 1 refills | Status: DC

## 2023-09-04 MED ORDER — OMEPRAZOLE 40 MG PO CPDR: 40.0000 mg | DELAYED_RELEASE_CAPSULE | Freq: Every day | ORAL | 0 refills | Status: DC

## 2023-09-04 NOTE — Telephone Encounter (Signed)
 Patient last fills 06/17/23 90 day supply each medication. Last labs Oct 2024 with PCM. Needs TSH recheck after taking correct dose levothyroxine  150mcgx 3 months Patient reminded today can schedule with RN Thersia Flax or PCM nonfasting.  Dispensed 90 days each levothyroxine  100mcg, levothyroxine  50mcg, omeprazole  DR 40mg  and simvastatin  40mg  to patient today from [facility].  levothyroxine  take 1 100mcg daily with 50mcg tab po daily ordered for patient. Omeprazole  40mg  DR po daily #90 Rf0, levothyroxine  100mcg po daily take with 50mcg po daily #90 each dispensed to patient today and simvastatin  40mg  po daily #90. Next fasting labs due Oct 2025 with PCM.   Component Ref Range & Units 7 mo ago  TSH 0.450 - 4.50 uIU/mL 15.600 High   Resulting Agency [facility] 1  Narrative Performed by [facility] Performed at:  [address] [facility] [address], [place], [place]  [id number] Lab Director: Pearlean Botts MD, Phone:  [phone] Specimen Collected: 01/23/23 09:40   Performed by: Trenia Fritter Last Resulted: 01/24/23 05:35  Received From: [facility]  Result Received: 01/29/23 09:22   cimen: Blood Component Ref Range & Units 7 mo ago Comments  Cholesterol, Total 100 - 199 mg/dL 371   Triglycerides 0 - 149 mg/dL 062   HDL >69 mg/dL 57   VLDL Cholesterol Cal 5 - 40 mg/dL 21   LDL 0 - 99 mg/dL 485 High    LDL/HDL Ratio 0.0 - 3.2 ratio 1.8                                     LDL/HDL Ratio                                             Men  Women                               1/2 Avg.Risk  1.0    1.5                                   Avg.Risk  3.6    3.2                                2X Avg.Risk  6.2    5.0                                3X Avg.Risk  8.0    6.1  Resulting Agency [facility]   Narrative Performed by [facility] Performed at:  [address] [facility] [address], [place], [place]  [id number] Lab Director: Pearlean Botts MD, Phone:  [phone] Specimen Collected: 01/23/23 09:40   Performed by:  Trenia Fritter Last Resulted: 01/24/23 05:35  Received From: [facility]  Result Received: 01/29/23 09:22  men: Blood Component Ref Range & Units 7 mo ago  Glucose 70 - 99 mg/dL 84  BUN 8 - 27 mg/dL 12  Creatinine 1.69 - 6.78 mg/dL 9.38  eGFR >10 FB/PZW/2.58 86  BUN/Creatinine Ratio 12 - 28 16  Sodium 134 - 144 mmol/L 133 Low   Potassium 3.5 -  5.2 mmol/L 4.9  Chloride 96 - 106 mmol/L 97  CO2 20 - 29 mmol/L 22  CALCIUM  8.7 - 10.3 mg/dL 9.5  Total Protein 6.0 - 8.5 g/dL 6.7  Albumin, Serum 3.9 - 4.9 g/dL 4.2  Globulin, Total 1.5 - 4.5 g/dL 2.5  Total Bilirubin 0.0 - 1.2 mg/dL 0.3  Alkaline Phosphatase 44 - 121 IU/L 90  AST 0 - 40 IU/L 25  ALT (SGPT) 0 - 32 IU/L 28  Resulting Agency [facility]  Narrative Performed by [facility] Performed at:  [address] [facility] [address] [place], [place]  [id number] Lab Director: Pearlean Botts MD, Phone:  [phone] Specimen Collected: 01/23/23 09:40   Performed by: Trenia Fritter Last Resulted: 01/24/23 05:35  Received From: [facility]  Result Received: 01/29/23 09:22

## 2023-09-09 ENCOUNTER — Other Ambulatory Visit

## 2023-09-10 ENCOUNTER — Other Ambulatory Visit: Payer: Self-pay

## 2023-09-10 VITALS — BP 137/94

## 2023-09-10 DIAGNOSIS — E039 Hypothyroidism, unspecified: Secondary | ICD-10-CM

## 2023-09-10 NOTE — Progress Notes (Signed)
 TSH lab

## 2023-09-11 ENCOUNTER — Ambulatory Visit: Payer: Self-pay | Admitting: Registered Nurse

## 2023-09-11 LAB — TSH: TSH: 3.69 u[IU]/mL (ref 0.450–4.500)

## 2023-09-12 ENCOUNTER — Other Ambulatory Visit: Payer: Self-pay | Admitting: Registered Nurse

## 2023-09-12 DIAGNOSIS — E039 Hypothyroidism, unspecified: Secondary | ICD-10-CM

## 2023-10-09 ENCOUNTER — Encounter: Payer: Self-pay | Admitting: Registered Nurse

## 2023-10-09 ENCOUNTER — Ambulatory Visit: Payer: Self-pay | Admitting: Registered Nurse

## 2023-10-09 VITALS — BP 130/90 | HR 66 | Resp 16

## 2023-10-09 DIAGNOSIS — M79672 Pain in left foot: Secondary | ICD-10-CM

## 2023-10-09 MED ORDER — MELOXICAM 7.5 MG PO TABS: 7.5000 mg | ORAL_TABLET | Freq: Every day | ORAL | 0 refills | Status: AC

## 2023-10-09 NOTE — Progress Notes (Signed)
 Established Patient Office Visit  Subjective   Patient ID: Victoria Morales, female    DOB: 09/16/1955  Age: 68 y.o. MRN: [id number]  Chief Complaint  Patient presents with   Pain    Left heel x 2 months    68y/o caucasian established female here for evaluation left heel pain worsening/flare up.  Has been wearing plantar fasciitis socks, shoe inserts, rotating shoes and icing.  Stopped using massage ball as pain had improved.  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  Denied injury/falls/rash/bruising or swelling.  Pain with weight bearing especially prolonged  She had some leg pain earlier in the week and after ensuring not altering gait resolved.      Review of Systems  Constitutional:  Negative for chills and fever.  Respiratory:  Negative for shortness of breath and wheezing.   Cardiovascular:  Negative for leg swelling.  Gastrointestinal:  Negative for diarrhea, nausea and vomiting.  Musculoskeletal:  Positive for myalgias. Negative for back pain, falls, joint pain and neck pain.  Neurological:  Negative for tingling, tremors, sensory change, seizures and weakness.  Psychiatric/Behavioral:  The patient does not have insomnia.       Objective:     BP (!) 130/90 (BP Location: Left Arm, Patient Position: Sitting)   Pulse 66   Resp 16    Physical Exam Vitals reviewed.  Constitutional:      General: She is awake. She is not in acute distress.    Appearance: Normal appearance. She is well-developed and well-groomed. She is not ill-appearing, toxic-appearing or diaphoretic.  HENT:     Head: Normocephalic and atraumatic.     Jaw: There is normal jaw occlusion.     Salivary Glands: Right salivary gland is not diffusely enlarged. Left salivary gland is not diffusely enlarged.     Right Ear: Hearing and external ear normal.     Left Ear: Hearing and external ear normal.     Nose: Nose normal. No congestion or rhinorrhea.     Mouth/Throat:      Lips: Pink. No lesions.     Mouth: Mucous membranes are moist.     Pharynx: Oropharynx is clear.  Eyes:     General: Lids are normal. Vision grossly intact. Gaze aligned appropriately. No scleral icterus.       Right eye: No discharge.        Left eye: No discharge.     Extraocular Movements: Extraocular movements intact.     Conjunctiva/sclera: Conjunctivae normal.     Pupils: Pupils are equal, round, and reactive to light.  Neck:     Trachea: Trachea normal.  Cardiovascular:     Rate and Rhythm: Normal rate and regular rhythm.     Pulses: Normal pulses.  Pulmonary:     Effort: Pulmonary effort is normal.     Breath sounds: Normal breath sounds and air entry. No stridor or transmitted upper airway sounds. No wheezing.     Comments: Spoke full sentences without difficulty; no cough observed in exam room Abdominal:     General: Abdomen is flat.     Palpations: Abdomen is soft.  Musculoskeletal:        General: Tenderness present. No swelling, deformity or signs of injury. Normal range of motion.     Cervical back: Normal range of motion and neck supple. No rigidity.     Right lower leg: No edema.     Left lower leg: No edema.  Left foot: Normal range of motion. No deformity, bunion or Charcot foot.       Feet:  Feet:     Left foot:     Skin integrity: Callus and dry skin present. No ulcer, blister, skin breakdown, erythema, warmth or fissure.     Toenail Condition: Left toenails are normal.     Comments: TTP left over achilles insertion/calcaneous posterior/medial/lateral/central mildly; clean new balance sneakers mild wear on tread Lymphadenopathy:     Head:     Right side of head: No submandibular or preauricular adenopathy.     Left side of head: No submandibular or preauricular adenopathy.     Cervical: No cervical adenopathy.     Right cervical: No superficial cervical adenopathy.    Left cervical: No superficial cervical adenopathy.  Skin:    General: Skin is warm  and dry.     Capillary Refill: Capillary refill takes less than 2 seconds.     Coloration: Skin is not ashen, cyanotic, jaundiced, mottled, pale or sallow.     Findings: Rash present. No abrasion, abscess, acne, bruising, burn, ecchymosis, erythema, signs of injury, laceration, lesion, petechiae or wound. Rash is scaling. Rash is not crusting, macular, nodular, papular, purpuric, pustular, urticarial or vesicular.     Nails: There is no clubbing.     Comments: Left foot plantar calloused  Neurological:     General: No focal deficit present.     Mental Status: She is alert and oriented to person, place, and time. Mental status is at baseline.     GCS: GCS eye subscore is 4. GCS verbal subscore is 5. GCS motor subscore is 6.     Cranial Nerves: No cranial nerve deficit, dysarthria or facial asymmetry.     Sensory: No sensory deficit.     Motor: Motor function is intact. No weakness, tremor, atrophy, abnormal muscle tone or seizure activity.     Coordination: Coordination is intact. Coordination normal.     Gait: Gait is intact. Gait normal.     Comments: In/out of chair without difficulty; gait sure and steady in clinic; bilateral hand grasp equal 5/5  Psychiatric:        Attention and Perception: Attention and perception normal.        Mood and Affect: Mood and affect normal.        Speech: Speech normal.        Behavior: Behavior normal. Behavior is cooperative.        Thought Content: Thought content normal.        Cognition and Memory: Cognition and memory normal.        Judgment: Judgment normal.     Very tender central calcaneus left  No results found for any visits on 10/09/23.    The 10-year ASCVD risk score (Arnett DK, et al., 2019) is: 6.7%    Assessment & Plan:   Problem List Items Addressed This Visit   None Visit Diagnoses       Pain of left heel    -  Primary     Electronic Rx mobic  7.5mg  po daily #30 RF0 take with food; continue ice 15 minutes QID especially  after work, continue plantar fasciitis socks, plantar fasciitis shoe insert.  Cannot tolerate prednisone .  Does not want xray or podiatry at this time  Discussed plantar fasciitis flare versus heel spur.  Exitcare handouts foot pain and plantar fasciitis.  History foot pain worsens after long day at work. Motrin OTC helping some along  with tylenol  OTC.  Consider acetaminophen  1000mg  po q6h prn pain along with mobic .  Take at least once a day for two weeks.  Exitcare handout on plantar fasciitis and plantar fasciitis rehab exercises  Discussed achilles/gastrocnemius/foot/plantar stretches and icing 15 minutes at least nightly with frozen water bottle rolling foot over.. Follow up with PCM if no improvement with discussed care for podiatry referral. Consider new supportive footwear/OTC inserts if shoe treads worn out/greater than 29 year old.  Do not walk barefoot at home or thin leather no support sandals/flip flops/shoes as this can worsen condition/pain.  Consider compression socks/plantar fasciitis socks and weight loss also. Patient verbalized understanding of instructions, agreed with plan of care and had no further questions at this time.   No follow-ups on file.    Ellouise DELENA Hope, NP

## 2023-10-09 NOTE — Patient Instructions (Addendum)
 Exercises for Plantar Fasciitis Foot and leg exercises can help if you have plantar fasciitis. Only do the exercises you were told to do. Make sure you know how to do the exercises safely. Follow the steps below. It's normal to feel mild discomfort. Stop if you feel pain or your pain gets worse. Do not start these exercises until told by your health care provider. Stretching and range-of-motion exercises These exercises warm up your muscles and joints. They also help with movement and flexibility of your foot. They can help with pain. Plantar fascia stretch This exercise will stretch your plantar fascia, which is a band of thick tissue on the bottom of your foot. Sit with your left / right leg crossed over your other knee. Hold your heel with one hand with that thumb near your arch. With your other hand, hold your toes. Gently pull your toes back toward the top of your foot. You should feel a stretch on the bottom of your toes, on the bottom of your foot, or both. Hold this stretch for ______30____ seconds. Slowly let go of your toes. Go back to the starting position. Repeat _____3_____ times. Do this exercise ____2______ times a day. Gastroc stretch, standing This exercise is called an upper calf, or gastroc, stretch. It stretches the muscles in the back of your upper calf. Stand with your hands against a wall. Extend your left / right leg behind you. Bend your front knee just a little. Keep your heels on the floor, your toes facing forward, and your back knee straight. Shift your weight toward the wall. Do not arch your back. You should feel a gentle stretch in your upper calf. Hold this position for ____30______ seconds. Repeat ____3 ______ times. Do this exercise ______2____ times a day. Soleus stretch, standing This exercise is called a lower calf, or soleus, stretch. It stretches the muscles in the back of your lower calf. Stand with your hands against a wall. Extend your left / right leg  behind you, and bend your front knee slightly. Keep your heels on the floor and your toes facing forward. Bend your back knee and shift your weight slightly over your back leg. You should feel a gentle stretch deep in your lower calf. Hold this position for ____30______ seconds. Repeat ____3______ times. Do this exercise ____2______ times a day. Gastroc and soleus stretch, standing step This exercise stretches the muscles in the back of your lower leg. This includes your gastroc and soleus muscles. Stand with the ball of your left / right foot on the front of a step. The ball of your foot is on the walking surface, right under your toes. Keep your other foot firmly on the same step. Hold on to the wall or a railing for balance. Slowly lift your other foot, letting your body weight press your heel down over the edge of the front of the step. Keep your knee straight and unbent. You should feel a stretch in your calf. Hold this position for ______30____ seconds. Return both feet to the step. Repeat this exercise with a slight bend in your left / right knee. Repeat ____3______ times with your left / right knee straight and _____3_____ times with your left / right knee bent. Do this exercise _____2_____ times a day. Balance exercise This exercise builds your balance and strength control of your arch. It helps take pressure off your plantar fascia. Single leg stand If this exercise is too easy, you can try it with your eyes  closed or while standing on a pillow. Without shoes, stand near a railing or in a doorway. You may hold on to the railing or doorway as needed. Stand on your left / right foot. Keep your big toe down on the floor. Lift the arch of your foot. You should feel a stretch across the bottom of your foot and arch. Do not let your foot roll inward. Hold this position for ___30_______ seconds. Repeat ____3______ times. Do this exercise ___2_______ times a day. This information is not  intended to replace advice given to you by your health care provider. Make sure you discuss any questions you have with your health care provider. Document Revised: 08/12/2022 Document Reviewed: 08/12/2022 Elsevier Patient Education  2024 Elsevier Inc.Foot Pain Many things can cause foot pain. Common causes include injuries to the foot. The injuries include sprains or broken bones, or injuries that affect the nerves in the feet. Other causes of foot pain include arthritis, blisters, and bunions. To know what causes your foot pain, your health care provider will take a detailed history of your symptoms. They will also do a physical exam as well as imaging tests, such as X-ray or MRI. Follow these instructions at home: Managing pain, stiffness, and swelling  If told, put ice on the painful area. Put ice in a plastic bag. Place a towel between your skin and the bag. Leave the ice on for 20 minutes, 2-3 times a day. If your skin turns bright red, remove the ice right away to prevent skin damage. The risk of damage is higher if you cannot feel pain, heat, or cold. Activity Do not stand or walk for long periods. Do stretches to relieve foot pain and stiffness as told by your provider. Do not lift anything that is heavier than 10 lb (4.5 kg), or the limit that you are told, until your provider says that it is safe. Lifting a lot of weight can put added pressure on your feet. Return to your normal activities as told by your provider. Ask your provider what activities are safe for you. Lifestyle Wear comfortable, supportive shoes that fit you well. Do not wear high heels. Keep your feet clean and dry. General instructions Take over-the-counter and prescription medicines only as told by your provider. Rub your foot gently. Pay attention to any changes in your symptoms. Let your provider know if symptoms become worse. Keep all follow-up visits. Your provider will want to monitor your  progress. Contact a health care provider if: Your pain does not get better after a few days of treatment at home. Your pain gets worse. You cannot stand on your foot. Your foot or toes are swollen. Your foot is numb or tingling. Get help right away if: Your foot or toes turn white or blue. You have warmth and redness along your foot. This information is not intended to replace advice given to you by your health care provider. Make sure you discuss any questions you have with your health care provider. Document Revised: 04/04/2022 Document Reviewed: 12/11/2021 Elsevier Patient Education  2024 Elsevier Inc.Boney Growth on the Heel (Heel Spur): What to Know  A heel spur is a bony growth that forms on the bottom of the heel bone. This growth can cause pain and discomfort on the bottom of your foot near the heel. The pain can get worse when walking or standing. Heel spurs often form over time due to repeated stress on the heel bone and nearby tissues. What are the  causes? The exact cause of heel spurs isn't known. They may be caused by: Pressure on the heel bone. Irritation of the strong tissues that connect the muscle to the heel bone. These strong tissues are called tendons. What increases the risk? You're more likely to get a heel spur if you: Are older than age 23. Are overweight. Have wear-and-tear arthritis, also called osteoarthritis. Have inflammation of the band of tissue that connects the toe bones to the heel bone (plantar fascia). Play sports or do activities that include a lot of running or jumping. Wear poorly fitted shoes. What are the signs or symptoms? Some people have no symptoms. If you do have symptoms, they may include: Pain in the bottom of your heel. Pain that's worse when you first get out of bed. Pain that gets worse after walking or standing. How is this diagnosed? This may be diagnosed based on: Your symptoms and medical history. A physical exam. A foot  X-ray. How is this treated? Treatment depends on how much pain you have. Treatment options may include: Doing stretching exercises. Losing weight, if needed. Wearing specific shoes or shoe inserts called orthotics. Wearing splints on your feet while you sleep. Splints keep your feet in a position (usually a 90-degree angle) that can prevent and relieve pain when you get out of bed. Splints can make stretching easier in the morning. Taking medicine to relieve pain. This may include NSAIDs, such as ibuprofen. Using high-intensity sound waves to break up the heel spur. This is called extracorporeal shock wave therapy. Getting steroid shots in your heel to lessen inflammation. Having surgery, if your heel spur causes long-term (chronic) pain. Follow these instructions at home:  Activity Avoid activities that cause pain until you recover, or for as long as told. Do stretching exercises. Stretch before exercising or being physically active. Managing pain, stiffness, and swelling Use ice or an ice pack as told. Place a towel between your skin and the ice. Leave the ice on for 20 minutes, 2-3 times a day. If your skin turns red, take off the ice right away to prevent skin damage. The risk of damage is higher if you can't feel pain, heat, or cold. Move your toes often. This can lessen stiffness and swelling. Raise your foot above the level of your heart while you're sitting or lying down. General instructions Take your medicines only as told. Wear supportive shoes that fit well. Wear splints, inserts, or orthotics as told. If recommended, work with your provider to lose weight. This can relieve pressure on your foot. Do not smoke, vape, or use nicotine or tobacco. Doing this can slow down healing. Where to find more information To learn more, go to: [employer] at [employer]aaos.org Click Search and type bone spurs. Find the link you need. Contact a health  care provider if: Your pain doesn't go away with treatment. Your pain gets worse. This information is not intended to replace advice given to you by your health care provider. Make sure you discuss any questions you have with your health care provider. Document Revised: 11/14/2022 Document Reviewed: 11/14/2022 Elsevier Patient Education  2024 [employer].

## 2023-10-09 NOTE — Telephone Encounter (Signed)
 Patient notified TSH normal and to consistently take levothyroxine  doses daily.  Patient agreed with plan of care feeling well and had no further questions at this time.

## 2023-10-13 ENCOUNTER — Telehealth: Payer: Self-pay

## 2023-10-13 DIAGNOSIS — M79672 Pain in left foot: Secondary | ICD-10-CM

## 2023-10-23 NOTE — Telephone Encounter (Signed)
 Patient reported meloxicam  has been working well for her.  No longer needing plantar fasciitis socks/splint.  Able to walk at work and when waking up without pain.  Patient stated since improving does not want x-rays at this time.  Asked if needed will she be able to get refill of meloxicam .  Yes notified patient to send message when refill needed and will send electronically to her pharmacy.  Discussed ensure taking with food to help decrease gastric irritation and avoid taking other nsaids e.g .motrin/ibuprofen/naproxen/diclofenac/voltaren/advil/aleve/naprosyn while take meloxicam /mobic .  Patient agreed with plan fo care and had no further questions at this time.  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

## 2023-11-11 ENCOUNTER — Ambulatory Visit: Admitting: Registered Nurse

## 2023-11-11 ENCOUNTER — Encounter: Payer: Self-pay | Admitting: Registered Nurse

## 2023-11-11 VITALS — HR 98 | Temp 98.2°F

## 2023-11-11 DIAGNOSIS — M25542 Pain in joints of left hand: Secondary | ICD-10-CM

## 2023-11-11 DIAGNOSIS — L2489 Irritant contact dermatitis due to other agents: Secondary | ICD-10-CM

## 2023-11-11 DIAGNOSIS — R635 Abnormal weight gain: Secondary | ICD-10-CM

## 2023-11-11 DIAGNOSIS — M79671 Pain in right foot: Secondary | ICD-10-CM

## 2023-11-11 MED ORDER — MELOXICAM 7.5 MG PO TABS
7.5000 mg | ORAL_TABLET | Freq: Every day | ORAL | 1 refills | Status: DC
Start: 2023-11-11 — End: 2023-11-13

## 2023-11-11 MED ORDER — ACETAMINOPHEN 500 MG PO TABS: 1000.0000 mg | ORAL_TABLET | Freq: Every day | ORAL | Status: AC | PRN

## 2023-11-11 NOTE — Progress Notes (Signed)
 Established Patient Office Visit  Subjective   Patient ID: Victoria Morales, female    DOB: [birth year]  Age: 68 y.o. MRN: [id number]  Chief Complaint  Patient presents with   foot pain and medication refill    68y/o caucasian female established here for re-evaluation of bilateral foot pain.  Able to walk more but due to pain earlier this year had stopped her exercise program which was walking with her partner outside on street.  Down to inserts in shoes not wearing plantar fasciitis socks due to rashes with certain fabrics and compression socks caused red rash both feet.  Requested refill on mobic  7.5mg  working well for her.  Has not tried frozen water bottle to ice feet on break at work.  Has been icing at home.  Typically walks on asphalt.  Walking at work not painful today.  Uses stand up desk doesn't sit when at work [employer].  Weight gain untintentional discovered earlier this year she wasn't taking correct dose of levothyroxine  but has been for the past few months.  She is feeling heavy in the body and doesn't like that sensation.  Walking is also a stress reliever for her partner and she likes to help her partner.  Left thumb has also started to hurt when she has to lift heavy objects denied known injury/bruising/rash/swelling      Review of Systems  Constitutional:  Negative for chills, fever and weight loss.  Respiratory:  Negative for cough and wheezing.   Cardiovascular:  Negative for leg swelling.  Musculoskeletal:  Positive for joint pain and myalgias. Negative for back pain, falls and neck pain.  Neurological:  Negative for weakness.      Objective:     Pulse 98   Temp 98.2 F (36.8 C)   SpO2 99%    Physical Exam Vitals and nursing note reviewed.  Constitutional:      General: She is awake. She is not in acute distress.    Appearance: Normal appearance. She is well-developed, well-groomed and overweight. She is not ill-appearing, toxic-appearing  or diaphoretic.  HENT:     Head: Normocephalic and atraumatic.     Jaw: There is normal jaw occlusion.     Salivary Glands: Right salivary gland is not diffusely enlarged. Left salivary gland is not diffusely enlarged.     Right Ear: Hearing and external ear normal. No decreased hearing noted. No laceration, drainage or swelling.     Left Ear: Hearing and external ear normal. No decreased hearing noted. No laceration, drainage or swelling.     Nose: Nose normal. No congestion or rhinorrhea.     Right Nostril: No epistaxis.     Left Nostril: No epistaxis.     Mouth/Throat:     Lips: Pink. No lesions.     Mouth: Mucous membranes are moist. No oral lesions or angioedema.     Dentition: No gum lesions.     Tongue: No lesions. Tongue does not deviate from midline.     Palate: No mass and lesions.     Pharynx: Oropharynx is clear. Uvula midline.  Eyes:     General: Lids are normal. Vision grossly intact. Gaze aligned appropriately. Allergic shiner present. No scleral icterus.       Right eye: No discharge.        Left eye: No discharge.     Extraocular Movements: Extraocular movements intact.     Conjunctiva/sclera: Conjunctivae normal.     Pupils: Pupils are equal, round,  and reactive to light.  Neck:     Trachea: Trachea and phonation normal.  Cardiovascular:     Rate and Rhythm: Normal rate and regular rhythm.     Pulses: Normal pulses.          Radial pulses are 2+ on the right side and 2+ on the left side.       Dorsalis pedis pulses are 2+ on the right side and 2+ on the left side.  Pulmonary:     Effort: Pulmonary effort is normal. No respiratory distress.     Breath sounds: Normal breath sounds and air entry. No stridor or transmitted upper airway sounds. No wheezing.     Comments: Spoke full sentences without difficulty; no cough observed in exam room Abdominal:     General: Abdomen is flat.     Palpations: Abdomen is soft.  Musculoskeletal:        General: Swelling and  tenderness present. No deformity or signs of injury. Normal range of motion.     Right elbow: No swelling, deformity, effusion or lacerations. Normal range of motion.     Left elbow: No swelling, deformity, effusion or lacerations. Normal range of motion.     Right forearm: No swelling, edema, deformity, lacerations or tenderness.     Left forearm: No swelling, edema, deformity, lacerations or tenderness.     Right wrist: No swelling, deformity, effusion, lacerations, tenderness or crepitus. Normal range of motion.     Left wrist: No swelling, deformity, effusion, lacerations, tenderness or crepitus. Normal range of motion.     Right hand: No swelling, deformity, lacerations or bony tenderness. Normal range of motion. Normal strength. Normal capillary refill.     Left hand: No swelling, deformity, lacerations or bony tenderness. Normal range of motion. Normal strength. Normal capillary refill.     Cervical back: Normal range of motion and neck supple. No swelling, edema, deformity, erythema, signs of trauma, lacerations, rigidity, torticollis, tenderness or crepitus. No pain with movement. Normal range of motion.     Right lower leg: No edema.     Left lower leg: No edema.     Right ankle: No swelling, deformity, ecchymosis or lacerations. No tenderness. Normal range of motion.     Right Achilles Tendon: No tenderness or defects.     Left ankle: No swelling, deformity, ecchymosis or lacerations. No tenderness. Normal range of motion.     Left Achilles Tendon: No tenderness or defects.     Right foot: Normal range of motion and normal capillary refill. Swelling and tenderness present. No bunion, Charcot foot, foot drop, laceration, bony tenderness or crepitus.     Left foot: Normal range of motion and normal capillary refill. No swelling, deformity, bunion, laceration, tenderness or crepitus.     Comments: Left thumb pain with AROM opposition and flexion; no soft tissue tenderness/swelling; strength  equal bilaterally thumbs/fingers/upper extremities; right heel soft tissue TTP nonpitting edema heel to midfoot 1+/4 right only; left no swelling or TTP  Lymphadenopathy:     Head:     Right side of head: No submandibular or preauricular adenopathy.     Left side of head: No submandibular or preauricular adenopathy.     Cervical: No cervical adenopathy.     Right cervical: No superficial, deep or posterior cervical adenopathy.    Left cervical: No superficial, deep or posterior cervical adenopathy.  Skin:    General: Skin is warm and dry.     Capillary Refill: Capillary refill takes  less than 2 seconds.     Coloration: Skin is not ashen, cyanotic, jaundiced, mottled, pale or sallow.     Findings: Abrasion, erythema and rash present. No abscess, acne, bruising, burn, ecchymosis, signs of injury, laceration, lesion, petechiae or wound. Rash is macular, papular and scaling. Rash is not crusting, nodular, purpuric, pustular, urticarial or vesicular.         Comments: Circumferential macular/papular rash dry with fine scaling at sock line ankle proximal only does not encompass entire foot or ankle papules linear pattern abraded tips bilaterally  Neurological:     General: No focal deficit present.     Mental Status: She is alert and oriented to person, place, and time. Mental status is at baseline.     GCS: GCS eye subscore is 4. GCS verbal subscore is 5. GCS motor subscore is 6.     Cranial Nerves: No cranial nerve deficit, dysarthria or facial asymmetry.     Sensory: Sensation is intact.     Motor: Motor function is intact. No weakness, tremor, atrophy, abnormal muscle tone or seizure activity.     Coordination: Coordination is intact. Coordination normal.     Gait: Gait is intact. Gait normal.     Comments: In/out of chair and on/off exam table without difficulty; gait sure and steady in clinic; bilateral hand grasp equal 5/5  Psychiatric:        Attention and Perception: Attention and  perception normal.        Mood and Affect: Mood and affect normal.        Speech: Speech normal.        Behavior: Behavior normal. Behavior is cooperative.        Thought Content: Thought content normal.        Cognition and Memory: Cognition and memory normal.        Judgment: Judgment normal.      No results found for any visits on 11/11/23.    The 10-year ASCVD risk score (Arnett DK, et al., 2019) is: 6.7%  Latest Reference Range & Units 09/05/22 08:25 09/10/23 10:20  Sodium 134 - 144 mmol/L 130 (L)   Potassium 3.5 - 5.2 mmol/L 4.9   Chloride 96 - 106 mmol/L 96   Glucose 70 - 99 mg/dL 86   BUN 8 - 27 mg/dL 16   Creatinine 9.42 - 1.00 mg/dL 9.19   Calcium  8.7 - 10.3 mg/dL 9.2   BUN/Creatinine Ratio 12 - 28  20   eGFR >59 mL/min/1.73 81   Phosphorus 3.0 - 4.3 mg/dL 4.8 (H)   Magnesium 1.6 - 2.3 mg/dL 1.9   Alkaline Phosphatase 44 - 121 IU/L 87   Albumin 3.9 - 4.9 g/dL 4.3   Albumin/Globulin Ratio  1.9   Uric Acid 3.0 - 7.2 mg/dL 4.7   AST 0 - 40 IU/L 20   ALT 0 - 32 IU/L 25   Total Protein 6.0 - 8.5 g/dL 6.6   Total Bilirubin 0.0 - 1.2 mg/dL 0.4   GGT 0 - 60 IU/L 20   Estimated CHD Risk 0.0 - 1.0 times avg.  < 0.5   LDH 119 - 226 IU/L 223   Total CHOL/HDL Ratio 0.0 - 4.4 ratio 3.1   Cholesterol, Total 100 - 199 mg/dL 843   HDL Cholesterol >60 mg/dL 50   Triglycerides 0 - 149 mg/dL 892   VLDL Cholesterol Cal 5 - 40 mg/dL 19   LDL Chol Calc (NIH) 0 - 99 mg/dL 87  Iron 27 - 139 ug/dL 849 (H)   Vitamin D , 25-Hydroxy 30.0 - 100.0 ng/mL 44.5   Globulin, Total 1.5 - 4.5 g/dL 2.3   WBC 3.4 - 89.1 k89Z6/lO 6.6   RBC 3.77 - 5.28 x10E6/uL 4.38   Hemoglobin 11.1 - 15.9 g/dL 86.6   HCT 65.9 - 53.3 % 41.2   MCV 79 - 97 fL 94   MCH 26.6 - 33.0 pg 30.4   MCHC 31.5 - 35.7 g/dL 67.6   RDW 88.2 - 84.5 % 11.9   Platelets 150 - 450 x10E3/uL 228   Neutrophils Not Estab. % 62   Immature Granulocytes Not Estab. % 0   NEUT# 1.4 - 7.0 x10E3/uL 4.1   Lymphs Abs 0.7 - 3.1 x10E3/uL  1.5   Monocytes Absolute 0.1 - 0.9 x10E3/uL 0.6   Basophils Absolute 0.0 - 0.2 x10E3/uL 0.1   Immature Grans (Abs) 0.0 - 0.1 x10E3/uL 0.0   Lymphs Not Estab. % 24   Monocytes Not Estab. % 9   Basos Not Estab. % 1   Eos Not Estab. % 4   EOS (ABSOLUTE) 0.0 - 0.4 x10E3/uL 0.3   Hemoglobin A1C 4.8 - 5.6 % 5.9 (H)   Est. average glucose Bld gHb Est-mCnc mg/dL 876   TSH 9.549 - 5.499 uIU/mL 1.640 3.690  Thyroxine (T4) 4.5 - 12.0 ug/dL 7.7   Free Thyroxine Index 1.2 - 4.9  2.0   T3 Uptake Ratio 24 - 39 % 26   (L): Data is abnormally low (H): Data is abnormally high   Assessment & Plan:   Problem List Items Addressed This Visit   None Visit Diagnoses       Acute foot pain, right    -  Primary     Weight gain         Irritant contact dermatitis due to other agents         Pain in thumb joint with movement of left hand          Patient reported meloxicam  has been working well for her foot pain. Electronic refill mobic  7.5mg  po daily #30 RF1 sent to her pharmacy of choice.  No longer needing plantar fasciitis socks/splint.  Able to walk at work and when waking up without pain.   Not walking barefoot at home.  Had restarted walking after work with partner discussed due to flare up again try to walk on treadmill/grass/padded track over the sidewalk/street/asphalt.  Next follow up in 2 weeks to re-evaluate if swelling resolved and pain improving.  May use tylenol  500mg  1-2 tabs po daily for breakthrough pain given 4 UD today.  Discussed sometimes arthritis responds better to tylenol .  Discussed swollen area I believe is still plantar fasciitis  due to prolonged standing at work.  Consider sitting when working on computer until this flare up resolves then slowly increase time/distance walking 10% per week.   Patient stated since improving does not want x-rays at this time. Ensure taking mobic /meloxicam  with food to help decrease gastric irritation and avoid taking other nsaids e.g  .motrin/ibuprofen/naproxen/diclofenac/voltaren/advil/aleve/naprosyn while take meloxicam /mobic . Use frozen water bottle at work mid day on break and roll under foot when sitting to eat/take break.  This also helps to stretch plantar fascia.  Discussed thumb most likely sprain but could be arthritis also.  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  Use mobic /ice/tylenol /rest after work and if swelling at work ice on break.  Seek re-evaluation sooner than  2 weeks if weakness/worsening symptoms suddenly.  Exitcare handout plantar fasciitis, foot pain and thumb sprain. Patient agreed with plan fo care and had no further questions at this time  Patient notified NP will be out of clinic 22-23 Nov 2023 and can scheduled with PA Cheryln or wait to schedule with NP Retta Pitcher first week of September.     Discussed weight gain most likely to decreased activity due to foot pain and TSH was elevated/hypothyroidism related weight gain and will take some time to lose weight again.  Consider swimming, seated weight, seated yoga, bicycling in addition to walking but has to build back up walking time and distance.  Continue wearing plantar fasciitis inserts in shoes.  Discussed may have arthritis in right foot/heel also consider xrays and podiatry referral.  Next labs due June 2026 unless having overt hypo/hyperthyroidism symptoms.  Exitcare handout on hypothyroidism.  Patient agreed with plan of care and had no further questions at this time.  Apply emollient to rash bilateral lower extremities e.g. aquaphor/vaseline ointment after bathing and prn dry/itchy skin. May use calagel BID or hydrocortisone 1% prn itching.  If no improvement with plan of care over next two weeks or if discharge/worsening pain/swelling seek re-evaluation sooner.  Patient agreed with plan of care and had no further questions at this time.  Exitcare handout on contact dermatitis.  Return if  symptoms worsen or fail to improve.    Ellouise DELENA Hope, NP

## 2023-11-11 NOTE — Patient Instructions (Addendum)
 Thumb Sprain  A thumb sprain is an injury to one of the bands of tissue that connect bones to each other (a ligament) in your thumb. The ligament may be stretched too much, or it may be torn. A tear can be either partial or complete. How bad, or severe, the sprain is depends on how much of the ligament was damaged or torn. What are the causes? A thumb sprain is often caused by a fall or an accident, such as when you hold your hands out to catch something or to protect yourself. What increases the risk? This injury is more likely to occur in people who play sports that involve: A risk of falling, such as skiing. Catching an object, such as basketball. What are the signs or symptoms? Symptoms of this condition include: Not being able to move the thumb normally. Swelling. Tenderness. Bruising. How is this diagnosed? This condition may be diagnosed based on: Your symptoms and medical history. Your health care provider may ask about any recent injuries to your thumb. A physical exam. Imaging studies, such as X-rays, ultrasound, or MRI. How is this treated? Treatment for this condition depends on how severe your sprain is. If your ligament is overstretched or partially torn, treatment usually involves keeping your thumb in a fixed position (immobilization) for at least 4 to 6 weeks. Your health care provider will apply a bandage (dressing), splint, brace, or cast to keep your thumb from moving until it heals. If your ligament is fully torn, you may need surgery to reconnect the ligament to the bone. After surgery, you will need to wear a cast or splint on your thumb. Your health care provider may also recommend physical therapy to strengthen your thumb. Follow these instructions at home: If you have a removable splint, bandage, or brace: Wear the splint, bandage, or brace as told by your health care provider. Remove it only as told by your health care provider. Check the skin around the splint,  bandage, or brace every day. Tell your health care provider about any concerns. Loosen the splint, bandage, or brace if your thumb or fingers tingle, become numb, or turn cold and blue. Keep it clean and dry. If you have a nonremovable cast: Do not put pressure on any part of the cast until it is fully hardened. This may take several hours. Do not stick anything inside the cast to scratch your skin. Doing that increases your risk of infection. Check the skin around the cast every day. Tell your health care provider about any concerns. You may put lotion on dry skin around the edges of the cast. Do not put lotion on the skin underneath the cast. Keep it clean and dry. Bathing Do not take baths, swim, or use a hot tub until your health care provider approves. Ask your health care provider if you may take showers. You may only be allowed to take sponge baths. If your splint, bandage, brace, or cast is not waterproof: Do not let it get wet. Cover it with a watertight covering when you take a bath or shower. Managing pain, stiffness, and swelling  If directed, put ice on your thumb. To do this: If you have a removable splint, bandage, or brace, remove it as told by your health care provider. Put ice in a plastic bag. Place a towel between your skin and the bag, or between your cast and the bag. Leave the ice on for 20 minutes, 2-3 times a day. Remove the ice  if your skin turns bright red. This is very important. If you cannot feel pain, heat, or cold, you have a greater risk of damage to the area. Move your fingers often to reduce stiffness and swelling. Raise (elevate) the injured area above the level of your heart while you are sitting or lying down. Activity Return to your normal activities as told by your health care provider. Ask your health care provider what activities are safe for you. Do physical therapy exercises as directed. After your splint, bandage, brace, or cast is removed, your  health care provider may recommend that you: Move your thumb in circles. Touch your thumb to your pinky finger. Do these exercises several times a day. Ask your health care provider if you may use a hand exerciser to strengthen your muscles. If your thumb feels stiff while you are exercising it, try doing the exercises while soaking your hand in warm water. Driving Ask your health care provider when it is safe to drive if you have a splint, bandage, brace, or cast on your hand or thumb. Ask your health care provider if the medicine prescribed to you requires you to avoid driving or using machinery. General instructions Take over-the-counter and prescription medicines only as told by your health care provider. Do not use any products that contain nicotine or tobacco. These products include cigarettes, chewing tobacco, and vaping devices, such as e-cigarettes. These can delay bone healing. If you need help quitting, ask your health care provider. Do not wear rings on your injured thumb. Keep all follow-up visits. This is important. Contact a health care provider if: You have pain that gets worse or does not get better with medicine. You have bruising or swelling that gets worse. Your cast, brace, or splint is damaged. Get help right away if: Your thumb feels numb, tingles, turns cold, or turns blue, even after loosening your splint, bandage, or brace. Summary A thumb sprain is an injury to one of the bands of tissue that connect bones to each other (a ligament) in your thumb. Thumb sprains are more likely to occur in people who play sports that involve a risk of falling or having to catch an object. Treatment will depend on how severe the sprain is, but it will require keeping the thumb in a fixed position (immobilization). It might require surgery. Make sure you understand and follow all of your health care provider's instructions for home care. This information is not intended to replace  advice given to you by your health care provider. Make sure you discuss any questions you have with your health care provider. Document Revised: 01/27/2023 Document Reviewed: 01/27/2023 Else[employer] Patient Education  2024 [employer]Contact Dermatitis Dermatitis is redness, soreness, and swelling (inflammation) of the skin. Contact dermatitis is a reaction to certain substances that touch the skin. There are two types of this condition: Irritant contact dermatitis. This is the most common type. It happens when something irritates your skin, such as when your hands get dry from washing them too often with soap. You can get this type of reaction even if you have not been exposed to the irritant before. Allergic contact dermatitis. This type is caused by a substance that you are allergic to, such as poison ivy. It occurs when you have been exposed to the substance (allergen) and form a sensitivity to it. In some cases, the reaction may start soon after your first exposure to the allergen. In other cases, it may not start until you are  exposed to the allergen again. It may then occur every time you are exposed to the allergen in the future. What are the causes? Irritant contact dermatitis is often caused by exposure to: Makeup. Soaps, detergents, and bleaches. Acids. Metal salts, such as nickel. Allergic contact dermatitis is often caused by exposure to: Poisonous plants. Chemicals. Jewelry. Latex. Medicines. Preservatives in products, such as clothes. What increases the risk? You are more likely to get this condition if you have: A job that exposes you to irritants or allergens. Certain medical conditions. These include asthma and eczema. What are the signs or symptoms? Symptoms of this condition may occur in any place on your body that has been touched by the irritant. Symptoms include: Dryness, flaking, or cracking. Redness. Itching. Pain or a burning feeling. Blisters. Drainage of  small amounts of blood or clear fluid from skin cracks. With allergic contact dermatitis, there may also be swelling in areas such as the eyelids, mouth, or genitals. How is this diagnosed? This condition is diagnosed with a medical history and physical exam. A patch skin test may be done to help figure out the cause. If the condition is related to your job, you may need to see an expert in health problems in the workplace (occupational medicine specialist). How is this treated? This condition is treated by staying away from the cause of the reaction and protecting your skin from further contact. Treatment may also include: Steroid creams or ointments. Steroid medicines may need be taken by mouth (orally) in more severe cases. Antibiotics or medicines applied to the skin to kill bacteria (antibacterial ointments). These may be needed if a skin infection is present. Antihistamines. These may be taken orally or put on as a lotion to ease itching. A bandage (dressing). Follow these instructions at home: Skin care Moisturize your skin as needed. Put cool, wet cloths (cool compresses) on the affected areas. Try applying baking soda paste to your skin. Stir water into baking soda until it has the consistency of a paste. Do not scratch your skin. Avoid friction to the affected area. Avoid the use of soaps, perfumes, and dyes. Check the affected areas every day for signs of infection. Check for: More redness, swelling, or pain. More fluid or blood. Warmth. Pus or a bad smell. Medicines Take or apply over-the-counter and prescription medicines only as told by your health care provider. If you were prescribed antibiotics, take or apply them as told by your health care provider. Do not stop using the antibiotic even if you start to feel better. Bathing Try taking a bath with: Epsom salts. Follow the instructions on the packaging. You can get these at your local pharmacy or grocery store. Baking  soda. Pour a small amount into the bath as told by your health care provider. Colloidal oatmeal. Follow the instructions on the packaging. You can get this at your local pharmacy or grocery store. Bathe less often. This may mean bathing every other day. Bathe in lukewarm water. Avoid using hot water. Bandage care If you were given a dressing, change it as told by your health care provider. Wash your hands with soap and water for at least 20 seconds before and after you change your dressing. If soap and water are not available, use hand sanitizer. General instructions Avoid the substance that caused your reaction. If you do not know what caused it, keep a journal to try to track what caused it. Write down: What you eat and drink. What cosmetics you  use. What you wear in the affected area. This includes jewelry. Contact a health care provider if: Your condition does not get better with treatment. Your condition gets worse. You have any signs of infection. You have a fever. You have new symptoms. Your bone or joint under the affected area becomes painful after the skin has healed. Get help right away if: You notice red streaks coming from the affected area. The affected area turns darker. You have trouble breathing. This information is not intended to replace advice given to you by your health care provider. Make sure you discuss any questions you have with your health care provider. Document Revised: 09/14/2021 Document Reviewed: 09/14/2021 Elsevier Patient Education  2024 [employer]Hypothyroidism  Hypothyroidism is when the thyroid  gland does not make enough of certain hormones. This is called an underactive thyroid . The thyroid  gland is a small gland located in the lower front part of the neck, just in front of the windpipe (trachea). This gland makes hormones that help control how the body uses food for energy (metabolism) as well as how the heart and brain function. These hormones  also play a role in keeping your bones strong. When the thyroid  is underactive, it produces too little of the hormones thyroxine (T4) and triiodothyronine (T3). What are the causes? This condition may be caused by: Hashimoto's disease. This is a disease in which the body's disease-fighting system (immune system) attacks the thyroid  gland. This is the most common cause. Viral infections. Pregnancy. Certain medicines. Birth defects. Problems with a gland in the center of the brain (pituitary gland). Lack of enough iodine in the diet. Other causes may include: Past radiation treatments to the head or neck for cancer. Past treatment with radioactive iodine. Past exposure to radiation in the environment. Past surgical removal of part or all of the thyroid . What increases the risk? You are more likely to develop this condition if: You are female. You have a family history of thyroid  conditions. You use a medicine called lithium. You take medicines that affect the immune system (immunosuppressants). What are the signs or symptoms? Common symptoms of this condition include: Not being able to tolerate cold. Feeling as though you have no energy (lethargy). Lack of appetite. Constipation. Sadness or depression. Weight gain that is not explained by a change in diet or exercise habits. Menstrual irregularity. Dry skin, coarse hair, or brittle nails. Other symptoms may include: Muscle pain. Slowing of thought processes. Poor memory. How is this diagnosed? This condition may be diagnosed based on: Your symptoms, your medical history, and a physical exam. Blood tests. You may also have imaging tests, such as an ultrasound or MRI. How is this treated? This condition is treated with medicine that replaces the thyroid  hormones that your body does not make. After you begin treatment, it may take several weeks for symptoms to go away. Follow these instructions at home: Take over-the-counter and  prescription medicines only as told by your health care provider. If you start taking any new medicines, tell your health care provider. Keep all follow-up visits as told by your health care provider. This is important. As your condition improves, your dosage of thyroid  hormone medicine may change. You will need to have blood tests regularly so that your health care provider can monitor your condition. Contact a health care provider if: Your symptoms do not get better with treatment. You are taking thyroid  hormone replacement medicine and you: Sweat a lot. Have tremors. Feel anxious. Lose weight rapidly. Cannot  tolerate heat. Have emotional swings. Have diarrhea. Feel weak. Get help right away if: You have chest pain. You have an irregular heartbeat. You have a rapid heartbeat. You have difficulty breathing. These symptoms may be an emergency. Get help right away. Call 911. Do not wait to see if the symptoms will go away. Do not drive yourself to the hospital. Summary Hypothyroidism is when the thyroid  gland does not make enough of certain hormones (it is underactive). When the thyroid  is underactive, it produces too little of the hormones thyroxine (T4) and triiodothyronine (T3). The most common cause is Hashimoto's disease, a disease in which the body's disease-fighting system (immune system) attacks the thyroid  gland. The condition can also be caused by viral infections, medicine, pregnancy, or past radiation treatment to the head or neck. Symptoms may include weight gain, dry skin, constipation, feeling as though you do not have energy, and not being able to tolerate cold. This condition is treated with medicine to replace the thyroid  hormones that your body does not make. This information is not intended to replace advice given to you by your health care provider. Make sure you discuss any questions you have with your health care provider. Document Revised: 03/13/2021 Document  Reviewed: 03/13/2021 Elsevier Patient Education  2024 [employer]Foot Pain Many things can cause foot pain. Common causes include injuries to the foot. The injuries include sprains or broken bones, or injuries that affect the nerves in the feet. Other causes of foot pain include arthritis, blisters, and bunions. To know what causes your foot pain, your health care provider will take a detailed history of your symptoms. They will also do a physical exam as well as imaging tests, such as X-ray or MRI. Follow these instructions at home: Managing pain, stiffness, and swelling  If told, put ice on the painful area. Put ice in a plastic bag. Place a towel between your skin and the bag. Leave the ice on for 20 minutes, 2-3 times a day. If your skin turns bright red, remove the ice right away to prevent skin damage. The risk of damage is higher if you cannot feel pain, heat, or cold. Activity Do not stand or walk for long periods. Do stretches to relieve foot pain and stiffness as told by your provider. Do not lift anything that is heavier than 10 lb (4.5 kg), or the limit that you are told, until your provider says that it is safe. Lifting a lot of weight can put added pressure on your feet. Return to your normal activities as told by your provider. Ask your provider what activities are safe for you. Lifestyle Wear comfortable, supportive shoes that fit you well. Do not wear high heels. Keep your feet clean and dry. General instructions Take over-the-counter and prescription medicines only as told by your provider. Rub your foot gently. Pay attention to any changes in your symptoms. Let your provider know if symptoms become worse. Keep all follow-up visits. Your provider will want to monitor your progress. Contact a health care provider if: Your pain does not get better after a few days of treatment at home. Your pain gets worse. You cannot stand on your foot. Your foot or toes are  swollen. Your foot is numb or tingling. Get help right away if: Your foot or toes turn white or blue. You have warmth and redness along your foot. This information is not intended to replace advice given to you by your health care provider. Make sure you discuss any  questions you have with your health care provider. Document Revised: 04/04/2022 Document Reviewed: 12/11/2021 Elsevier Patient Education  2024 [employer]Plantar Fasciitis: What to Know  Your plantar fascia is a band of thick tissue on the bottom of your foot. It connects your heel bone to the base of your toes. If the fascia gets irritated, it can cause pain in your heel or foot. This is called plantar fasciitis. In some cases, plantar fasciitis can make it hard for you to walk or move. The pain is often worse in the morning after sleeping, or after sitting or lying down for a long time. Pain may also be worse after walking or standing for a long time. What are the causes? Plantar fasciitis may be caused by: Standing for a long time. Wearing shoes that don't have good arch support. Doing high-impact activities. These are things that put stress on your joints. They include: Ballet. Aerobic exercises. These are exercises that make your heart beat faster. Being overweight. Having a way of walking, or gait, that isn't normal. Tight muscles in your calf, which is in the back of your lower leg. High arches in your feet, or flat feet. Starting a new sport or activity. What are the signs or symptoms? The main symptom of plantar fasciitis is heel pain. Your pain may get worse after: You take your first steps after a time of rest. This includes in the morning after you wake up, or after you've been sitting or lying down for a while. Standing still for a long time. Pain may lessen after 30-45 minutes of activity, such as gentle walking. How is this diagnosed? Plantar fasciitis may be diagnosed based on your medical history, your  symptoms, and an exam. Your health care provider will check for: A tender spot on the bottom of your foot. A high arch in your foot, or flat feet. Pain when you move your foot. Trouble moving your foot. You may also have tests. These may include: X-rays. Ultrasound. MRI. How is this treated? Treatment depends on how bad your plantar fasciitis is. It may include: RICE therapy. This stands for rest, ice, pressure (compression), and raising (elevating) the foot. Exercises to stretch your calves and plantar fascia. A night splint. This holds your foot in a stretched, upward position while you sleep. Physical therapy. This can help with symptoms. It can also prevent problems in the future. Shots of a steroid medicine called cortisone. This can help with pain and irritation. Extracorporeal shock wave therapy. This uses electric shocks to stimulate your plantar fascia. If other treatments don't help, you may need to have surgery. Follow these instructions at home: Managing pain, stiffness, and swelling  Use ice, an ice pack, or a frozen bottle of water as told. Place a towel between your skin and the ice. Roll the bottom of your foot over the ice or frozen bottle. Do this for 20 minutes, 2-3 times a day. If your skin turns red, take off the ice right away to prevent skin damage. The risk of damage is higher if you can't feel pain, heat, or cold. Wear shoes that have air-sole or gel-sole cushions. You could also try soft shoe inserts made for plantar fasciitis. Activity Try not to do things that cause pain. Ask what things are safe for you to do. Exercise as told. Try activities that are low impact. This means that they're easier on your joints. They include: Swimming. Water aerobics. Biking. General instructions Take your medicines only as told.  Wear a night splint as told. Loosen the splint if your toes tingle, are numb, or turn cold and blue. Stay at a healthy weight. Work with your  provider to lose weight as needed. Contact a health care provider if: Your symptoms don't go away with treatment. You have pain that gets worse. Your pain makes it hard to move or do everyday things. This information is not intended to replace advice given to you by your health care provider. Make sure you discuss any questions you have with your health care provider. Document Revised: 08/12/2022 Document Reviewed: 08/12/2022 Elsevier Patient Education  2024 [employer].

## 2023-11-12 ENCOUNTER — Other Ambulatory Visit: Payer: Self-pay | Admitting: Registered Nurse

## 2023-11-12 DIAGNOSIS — M79671 Pain in right foot: Secondary | ICD-10-CM

## 2023-11-12 DIAGNOSIS — S93402A Sprain of unspecified ligament of left ankle, initial encounter: Secondary | ICD-10-CM

## 2023-11-13 ENCOUNTER — Encounter: Payer: Self-pay | Admitting: Registered Nurse

## 2023-11-13 ENCOUNTER — Ambulatory Visit: Admitting: Registered Nurse

## 2023-11-13 VITALS — BP 140/82 | HR 83 | Temp 98.6°F

## 2023-11-13 DIAGNOSIS — J0101 Acute recurrent maxillary sinusitis: Secondary | ICD-10-CM

## 2023-11-13 DIAGNOSIS — S0083XA Contusion of other part of head, initial encounter: Secondary | ICD-10-CM

## 2023-11-13 DIAGNOSIS — S93402A Sprain of unspecified ligament of left ankle, initial encounter: Secondary | ICD-10-CM

## 2023-11-13 DIAGNOSIS — W010XXA Fall on same level from slipping, tripping and stumbling without subsequent striking against object, initial encounter: Secondary | ICD-10-CM

## 2023-11-13 MED ORDER — CEPHALEXIN 500 MG PO CAPS: 500.0000 mg | ORAL_CAPSULE | Freq: Two times a day (BID) | ORAL | Status: AC
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
## 2023-11-13 NOTE — Telephone Encounter (Signed)
 Patient with recent left ankle sprain using compression sleeve/ice; left thumb sprain and right foot pain/plantar fasciitis  refilled mobic  7.5mg  po daily #30 RF0 electronic Rx sent to her pharmacy of choice due to new injury may need longer course for this plantar fasciitis flare up/altered gait due to ankle sprain.

## 2023-11-13 NOTE — Patient Instructions (Addendum)
 Contusion A contusion is a deep bruise. Contusions are the result of a blunt injury to tissues and muscle fibers under the skin. The injury causes bleeding under the skin. The skin over the contusion may turn blue, purple, or yellow. Minor injuries will give you a painless contusion, but more severe injuries cause contusions that can stay painful and swollen for a few weeks. Follow these instructions at home: Pay attention to any changes in your symptoms. Let your health care provider know about them. Take these actions to relieve your pain. Managing pain, stiffness, and swelling  Use resting, icing, applying pressure (compression), and raising (elevating) the injured area. This is often called the RICE method. Rest the injured area. Return to your normal activities as told by your health care provider. Ask your health care provider what activities are safe for you. If directed, put ice on the injured area. To do this: Put ice in a plastic bag. Place a towel between your skin and the bag. Leave the ice on for 20 minutes, 2-3 times a day. If your skin turns bright red, remove the ice right away to prevent skin damage. The risk of skin damage is higher if you cannot feel pain, heat, or cold. If directed, apply light compression to the injured area using an elastic bandage. Make sure the bandage is not wrapped too tightly. Remove and reapply the bandage as directed by your health care provider. If possible, elevate the injured area above the level of your heart while you are sitting or lying down. General instructions Take over-the-counter and prescription medicines only as told by your health care provider. Keep all follow-up visits. Your health care provider may want to see how your contusion is healing with treatment. Contact a health care provider if: Your symptoms do not improve after several days of treatment. Your symptoms get worse. You have difficulty moving the injured area. Get help  right away if: You have severe pain. You have numbness in a hand or foot. Your hand or foot turns pale or cold. This information is not intended to replace advice given to you by your health care provider. Make sure you discuss any questions you have with your health care provider. Document Revised: 08/27/2021 Document Reviewed: 08/27/2021 [employer] Patient Education  2024 [employer]Ankle Sprain, Phase I Rehab An ankle sprain is an injury to the tissues that connect bone to bone (ligaments) in your ankle. Ankle sprains can cause stiffness, loss of motion, and loss of strength. Ask your health care provider which exercises are safe for you. Do exercises exactly as told by your provider and adjust them as directed. It is normal to feel mild stretching, pulling, tightness, or discomfort as you do these exercises. Stop right away if you feel sudden pain or your pain gets worse. Do not begin these exercises until told by your provider. Stretching and range-of-motion exercises These exercises warm up your muscles and joints. They can improve the movement and flexibility of your lower leg and ankle. They also help to relieve pain and stiffness. Gastroc and soleus stretch This exercise is also called a calf stretch. It stretches the muscles in the back of the lower leg. These muscles are the gastrocnemius, or gastroc, and the soleus. Sit on the floor with your left / right leg extended. Loop a belt or towel around the ball of your left / right foot. The ball of your foot is on the walking surface, right under your toes. Keep your left /  right ankle and foot relaxed and keep your knee straight. Use the belt or towel to pull your foot toward you. You should feel a gentle stretch behind your calf or knee in your gastroc muscle. Hold this position for ____30______ seconds, then release to the starting position. Repeat the exercise with your knee bent. You can put a pillow or a rolled bath towel under your  knee to support it. You should feel a stretch deep in your calf in the soleus muscle or at your Achilles tendon. Repeat _____3_____ times. Complete this exercise ___2_______ times a day. Ankle alphabet  Sit with your left / right leg supported at the lower leg. Do not rest your foot on anything. Make sure your foot has room to move freely. Think of your left / right foot as a paintbrush. Move your foot to trace each letter of the alphabet in the air. Keep your hip and knee still while you trace. Make the letters as large as you can without feeling discomfort. Trace every letter from A to Z. Repeat ____2______ times. Complete this exercise ____2______ times a day. Strengthening exercises These exercises build strength and endurance in your ankle and lower leg. Endurance is the ability to use your muscles for a long time, even after they get tired. Ankle dorsiflexion  Secure a rubber exercise band or tube to an object, such as a table leg, that will stay still when the band is pulled. Secure the other end around your left / right foot. Sit on the floor facing the object, with your left / right leg extended. The band or tube should be slightly tense when your foot is relaxed. Slowly bring your foot toward you, bringing the top of your foot toward your shin (dorsiflexion), and pulling the band tighter. Hold this position for _____30_____ seconds. Slowly return your foot to the starting position. Repeat ____3______ times. Complete this exercise _____2_____ times a day. Ankle plantar flexion  Sit on the floor with your left / right leg extended. Loop a rubber exercise tube or band around the ball of your left / right foot. The ball of your foot is on the walking surface, right under your toes. Hold the ends of the band or tube in your hands. The band or tube should be slightly tense when your foot is relaxed. Slowly point your foot and toes downward to tilt the top of your foot away from your  shin (plantar flexion). Hold this position for ___30_______ seconds. Slowly return your foot to the starting position. Repeat _____3_____ times. Complete this exercise ____2______ times a day. Ankle eversion  Sit on the floor with your legs straight out in front of you. Loop a rubber exercise band or tube around the ball of your left / right foot. The ball of your foot is on the walking surface, right under your toes. Hold the ends of the band in your hands or secure the band to a stable object. The band or tube should be slightly tense when your foot is relaxed. Slowly push your foot outward, away from your other leg (eversion). Hold this position for ____30______ seconds. Slowly return your foot to the starting position. Repeat _____3_____ times. Complete this exercise __________ times a day. This information is not intended to replace advice given to you by your health care provider. Make sure you discuss any questions you have with your health care provider. Document Revised: 01/02/2022 Document Reviewed: 01/02/2022 Else[employer] Patient Education  2024 [employer]Ankle Sprain  An ankle sprain is a stretch or tear in a ligament in the ankle. Ligaments are tissues that connect bones to each other. The two most common types of ankle sprains are: Inversion sprain. This happens when the foot turns inward and the ankle rolls outward. It affects the ligament on the outside of the foot (lateral ligament). Eversion sprain. This happens when the foot turns outward and the ankle rolls inward. It affects the ligament on the inner side of the foot (medial ligament). What are the causes? An ankle sprain is often caused by rolling or twisting the ankle by accident. What increases the risk? You are more likely to get an ankle sprain if you play sports. What are the signs or symptoms? Symptoms of an ankle sprain include: Pain in your ankle. Swelling. Bruising. Bruises may form right after you sprain  your ankle or 1-2 days later. Trouble standing or walking. This includes trouble turning or changing directions. How is this diagnosed? An ankle sprain is diagnosed with a physical exam. Your health care provider will press on parts of your foot and ankle and try to move them in certain ways. You may also have X-rays taken. These may be done to see how severe the sprain is and to check for broken bones. How is this treated? An ankle sprain may be treated with: A brace or splint. This is used to keep the ankle from moving until it heals. An elastic bandage (dressing). This is used to support the ankle. Crutches. Pain medicine. Surgery. This may be needed if the sprain is severe. Physical therapy. This may help to improve the range of motion in the ankle. Follow these instructions at home: If you have a removable brace or a splint: Wear the brace or splint as told by your provider. Remove it only as told by your provider. Check the skin around the brace or splint every day. Tell your provider about any concerns. Loosen the brace or splint if your toes tingle, become numb, or turn cold and blue. Keep the brace or splint clean. If the brace or splint is not waterproof: Do not let it get wet. Cover it with a watertight covering when you take a bath or a shower. If you have an elastic dressing: Take the dressing off to shower or bathe. If the dressing feels too tight, adjust it to make it more comfortable. Loosen the dressing if your foot tingles, becomes numb, or turns cold and blue. Managing pain, stiffness, and swelling If told, put ice on the affected area. If you have a removable brace or splint, remove it as told by your provider. Put ice in a plastic bag. Place a towel between your skin and the bag. Leave the ice on for 20 minutes, 2-3 times a day. Remove the ice if your skin turns bright red. This is very important. If you cannot feel pain, heat, or cold, you have a greater risk of  damage to the area. If your skin turns bright red, remove the ice right away to prevent skin damage. The risk of damage is higher if you cannot feel pain, heat, or cold. Move your toes often to reduce stiffness and swelling. For 2-3 days, raise (elevate) your ankle above the level of your heart while you are sitting or lying down. General instructions Take over-the-counter and prescription medicines only as told by your provider. Do not use any products that contain nicotine or tobacco. These products include cigarettes, chewing tobacco, and vaping devices,  such as e-cigarettes. If you need help quitting, ask your provider. Rest your ankle. Do not use your ankle to support your body weight until your provider says that you can. Use crutches as told by your provider. Ask your provider when it is safe to drive if you have a brace or splint on your ankle. Contact a health care provider if: You have bruising or swelling that get worse all of a sudden. Your pain does not get better with medicine. Get help right away if: Your foot or toes become numb or blue. You have severe pain that gets worse. This information is not intended to replace advice given to you by your health care provider. Make sure you discuss any questions you have with your health care provider. Document Revised: 12/12/2021 Document Reviewed: 12/12/2021 Elsevier Patient Education  2024 [employer]Head Injury, Adult There are many types of head injuries. Head injuries can be as minor as a small bump, or they can be a serious medical issue. More severe head injuries include: A jarring injury to the brain (concussion). A bruise (contusion) of the brain. This means there is bleeding in the brain that can cause swelling. A cracked skull (skull fracture). Bleeding in the brain that collects, clots, and forms a bump (hematoma). After a head injury, most problems occur within the first 24 hours, but side effects may occur up to 7-10  days after the injury. It is important to watch your condition for any changes. You may need to be observed in the emergency department or urgent care, or you may have to stay in the hospital. What are the causes? There are many causes of a head injury. Serious head injuries may be caused by car crashes, bicycle or motorcycle crashes, sports injuries, falls, or being struck by an object. What are the symptoms? Symptoms of a head injury include a contusion, bump, or bleeding at the site of the injury. Other physical symptoms may include: Headache. Nausea or vomiting. Dizziness. Blurred or double vision. Sensitivity to bright lights or loud noises. Feeling tired. Trouble waking up. Severe symptoms such as: Weakness or numbness on one side of the body. Slurred speech or swallowing problems. Loss of consciousness. Seizures. Mental symptoms may include: Irritability. Confusion and memory problems. Poor attention and concentration. Changes in eating or sleeping habits. Anxiety or depression. How is this diagnosed? This condition is diagnosed based on your symptoms and a physical exam. You may also have imaging tests done, such as a CT scan or an MRI. How is this treated? Treatment for this condition depends on the severity and type of injury you have. The main goal of treatment is to prevent complications and allow the brain time to heal. Mild head injury If you have a mild head injury, you may be sent home, and treatment may include: Observation. A responsible adult should stay with you for 24 hours after your injury and check on you often. Physical rest. Brain rest. Pain medicines. Severe head injury If you have a severe head injury, treatment may include: Close observation. You may have to stay in the hospital and have: Frequent physical exams. Frequent checks of how your brain and nervous system are working. Your blood pressure and oxygen levels checked. Medicines to relieve  pain, prevent seizures, and decrease brain swelling. Airway protection and breathing support. This may include using a ventilator. Monitoring and managing swelling inside the brain. Brain surgery. Surgery may include: Removing a collection of blood or blood clots. Stopping the  bleeding. Removing a part of the skull to make room for the brain to swell. Follow these instructions at home: Activity Rest. Avoid activities that are hard or tiring. Make sure you get enough sleep. Let your brain rest by limiting activities that take a lot of thought or attention, such as: Watching TV. Playing memory games and doing puzzles. Job-related work or homework. Working on [employer], [employer], and texting. Avoid activities that could cause another head injury, such as playing sports, until your health care provider approves. Ask your provider when it is safe for you to return to your regular activities, such as work or school. Ask your provider when you can drive, ride a bicycle, or use machinery. Your ability to react may be slower after a brain injury. Do not do these activities if you are dizzy. Lifestyle  Do not drink alcohol until your provider approves. Do not use drugs. Alcohol and certain drugs may slow your recovery and can put you at risk of further injury. If it is hard to remember things, write them down. If you are easily distracted, try to do one thing at a time. Talk with family members or close friends when making important decisions. Tell your friends, family, a trusted colleague, and work Production designer, theatre/television/film about your injury, symptoms, and restrictions. Ask them to watch for any problems that are new or get worse. General instructions Take over-the-counter and prescription medicines only as told by your provider. Have a responsible adult stay with you for 24 hours after your head injury. They should watch you for any changes in your symptoms and be ready to get help right away. Keep  all follow-up visits to make sure your needs are being met and catch any new problems early. How is this prevented? Avoiding another brain injury is very important. In rare cases, another injury can lead to permanent brain damage, brain swelling, or death. The risk of this is greatest during the first 7-10 days after a head injury. To avoid injuries: Improve your balance and strength to avoid falls. Wear a seat belt when you are in a moving vehicle. Wear a helmet when riding a bicycle, skiing, or doing any other sport that has a risk of injury. Take safety measures in your home to prevent falls, such as: Removing clutter and tripping hazards. Using grab bars in bathrooms and handrails by stairs. Placing non-slip mats on floors and in bathtubs. Improving lighting in dim areas. Where to find more information Brain Injury Association: biausa.org Contact a health care provider if: You have headaches that do not go away. You have dizziness that does not go away. You have double vision or vision changes that do not go away. You have difficulty sleeping. You have changes in your mood. You have new symptoms. Get help right away if: You have sudden: Severe headache. Severe vomiting. Unequal pupil size. One is bigger than the other. Vision problems. Confusion or irritability. You have a seizure. Your symptoms get worse. You have clear or bloody fluid coming from your nose or ears. These symptoms may be an emergency. Get help right away. Call 911. Do not wait to see if the symptoms will go away. Do not drive yourself to the hospital. This information is not intended to replace advice given to you by your health care provider. Make sure you discuss any questions you have with your health care provider. Document Revised: 12/27/2021 Document Reviewed: 12/27/2021 Elsevier Patient Education  2024 [employer]How to Perform a  Sinus Rinse A sinus rinse is a home treatment that is used to rinse  your sinuses with a germ-free (sterile) mixture of salt and water (saline solution). Sinuses are air-filled spaces in your skull that are behind the bones of your face and forehead. They open into your nasal cavity. A sinus rinse can help to clear mucus, dirt, dust, or pollen from your nasal cavity. You may do a sinus rinse when you have a cold, a virus, nasal allergy symptoms, a sinus infection, or stuffiness in your nose or sinuses. What are the risks? A sinus rinse is generally safe and effective. However, there are a few risks, which include: A burning sensation in your sinuses. This may happen if you do not make the saline solution as directed. Be sure to follow all directions when making the saline solution. Nasal irritation. Infection. This may be from unclean supplies or from contaminated water. Infection from contaminated water is rare, but possible. Do not do a sinus rinse if you have had ear or nasal surgery, ear infection, or plugged ears, unless recommended by your health care provider. Supplies needed: Saline solution or powder. Distilled or sterile water to mix with saline powder. You may use boiled and cooled tap water. Boil tap water for 5 minutes; cool until it is lukewarm. Use within 24 hours. Do not use regular tap water to mix with the saline solution. Neti pot or nasal rinse bottle. These supplies release the saline solution into your nose and through your sinuses. Neti pots and nasal rinse bottles can be purchased at [employer], a health food store, or online. How to perform a sinus rinse  Wash your hands with soap and water for at least 20 seconds. If soap and water are not available, use hand sanitizer. Wash your device according to the directions that came with the product and then dry it. Use the solution that comes with your product or one that is sold separately in stores. Follow the mixing directions on the package to mix with sterile or distilled water. Fill  the device with the amount of saline solution noted in the device instructions. Stand by a sink and tilt your head sideways over the sink. Place the spout of the device in your upper nostril (the one closer to the ceiling). Gently pour or squeeze the saline solution into your nasal cavity. The liquid should drain out from the lower nostril if you are not too congested. While rinsing, breathe through your open mouth. Gently blow your nose to clear any mucus and rinse solution. Blowing too hard may cause ear pain. Turn your head in the other direction and repeat in your other nostril. Clean and rinse your device with clean water and then air-dry it. Talk with your health care provider or pharmacist if you have questions about how to do a sinus rinse. Summary A sinus rinse is a home treatment that is used to rinse your sinuses with a sterile mixture of salt and water (saline solution). You may do a sinus rinse when you have a cold, a virus, nasal allergy symptoms, a sinus infection, or stuffiness in your nose or sinuses. A sinus rinse is generally safe and effective. Follow all instructions carefully. This information is not intended to replace advice given to you by your health care provider. Make sure you discuss any questions you have with your health care provider. Document Revised: 08/28/2020 Document Reviewed: 08/28/2020 Elsevier Patient Education  2024 [employer]Sinus Infection, Adult A sinus  infection, also called sinusitis, is inflammation of your sinuses. Sinuses are hollow spaces in the bones around your face. Your sinuses are located: Around your eyes. In the middle of your forehead. Behind your nose. In your cheekbones. Mucus normally drains out of your sinuses. When your nasal tissues become inflamed or swollen, mucus can become trapped or blocked. This allows bacteria, viruses, and fungi to grow, which leads to infection. Most infections of the sinuses are caused by a virus. A  sinus infection can develop quickly. It can last for up to 4 weeks (acute) or for more than 12 weeks (chronic). A sinus infection often develops after a cold. What are the causes? This condition is caused by anything that creates swelling in the sinuses or stops mucus from draining. This includes: Allergies. Asthma. Infection from bacteria or viruses. Deformities or blockages in your nose or sinuses. Abnormal growths in the nose (nasal polyps). Pollutants, such as chemicals or irritants in the air. Infection from fungi. This is rare. What increases the risk? You are more likely to develop this condition if you: Have a weak body defense system (immune system). Do a lot of swimming or diving. Overuse nasal sprays. Smoke. What are the signs or symptoms? The main symptoms of this condition are pain and a feeling of pressure around the affected sinuses. Other symptoms include: Stuffy nose or congestion that makes it difficult to breathe through your nose. Thick yellow or greenish drainage from your nose. Tenderness, swelling, and warmth over the affected sinuses. A cough that may get worse at night. Decreased sense of smell and taste. Extra mucus that collects in the throat or the back of the nose (postnasal drip) causing a sore throat or bad breath. Tiredness (fatigue). Fever. How is this diagnosed? This condition is diagnosed based on: Your symptoms. Your medical history. A physical exam. Tests to find out if your condition is acute or chronic. This may include: Checking your nose for nasal polyps. Viewing your sinuses using a device that has a light (endoscope). Testing for allergies or bacteria. Imaging tests, such as an MRI or CT scan. In rare cases, a bone biopsy may be done to rule out more serious types of fungal sinus disease. How is this treated? Treatment for a sinus infection depends on the cause and whether your condition is chronic or acute. If caused by a virus, your  symptoms should go away on their own within 10 days. You may be given medicines to relieve symptoms. They include: Medicines that shrink swollen nasal passages (decongestants). A spray that eases inflammation of the nostrils (topical intranasal corticosteroids). Rinses that help get rid of thick mucus in your nose (nasal saline washes). Medicines that treat allergies (antihistamines). Over-the-counter pain relievers. If caused by bacteria, your health care provider may recommend waiting to see if your symptoms improve. Most bacterial infections will get better without antibiotic medicine. You may be given antibiotics if you have: A severe infection. A weak immune system. If caused by narrow nasal passages or nasal polyps, surgery may be needed. Follow these instructions at home: Medicines Take, use, or apply over-the-counter and prescription medicines only as told by your health care provider. These may include nasal sprays. If you were prescribed an antibiotic medicine, take it as told by your health care provider. Do not stop taking the antibiotic even if you start to feel better. Hydrate and humidify  Drink enough fluid to keep your urine pale yellow. Staying hydrated will help to thin your mucus.  Use a cool mist humidifier to keep the humidity level in your home above 50%. Inhale steam for 10-15 minutes, 3-4 times a day, or as told by your health care provider. You can do this in the bathroom while a hot shower is running. Limit your exposure to cool or dry air. Rest Rest as much as possible. Sleep with your head raised (elevated). Make sure you get enough sleep each night. General instructions  Apply a warm, moist washcloth to your face 3-4 times a day or as told by your health care provider. This will help with discomfort. Use nasal saline washes as often as told by your health care provider. Wash your hands often with soap and water to reduce your exposure to germs. If soap and  water are not available, use hand sanitizer. Do not smoke. Avoid being around people who are smoking (secondhand smoke). Keep all follow-up visits. This is important. Contact a health care provider if: You have a fever. Your symptoms get worse. Your symptoms do not improve within 10 days. Get help right away if: You have a severe headache. You have persistent vomiting. You have severe pain or swelling around your face or eyes. You have vision problems. You develop confusion. Your neck is stiff. You have trouble breathing. These symptoms may be an emergency. Get help right away. Call 911. Do not wait to see if the symptoms will go away. Do not drive yourself to the hospital. Summary A sinus infection is soreness and inflammation of your sinuses. Sinuses are hollow spaces in the bones around your face. This condition is caused by nasal tissues that become inflamed or swollen. The swelling traps or blocks the flow of mucus. This allows bacteria, viruses, and fungi to grow, which leads to infection. If you were prescribed an antibiotic medicine, take it as told by your health care provider. Do not stop taking the antibiotic even if you start to feel better. Keep all follow-up visits. This is important. This information is not intended to replace advice given to you by your health care provider. Make sure you discuss any questions you have with your health care provider. Document Revised: 02/13/2021 Document Reviewed: 02/13/2021 Elsevier Patient Education  2024 [employer] Inc.Contusion A contusion is a deep bruise. Contusions are the result of a blunt injury to tissues and muscle fibers under the skin. The injury causes bleeding under the skin. The skin over the contusion may turn blue, purple, or yellow. Minor injuries will give you a painless contusion, but more severe injuries cause contusions that can stay painful and swollen for a few weeks. Follow these instructions at home: Pay attention  to any changes in your symptoms. Let your health care provider know about them. Take these actions to relieve your pain. Managing pain, stiffness, and swelling  Use resting, icing, applying pressure (compression), and raising (elevating) the injured area. This is often called the RICE method. Rest the injured area. Return to your normal activities as told by your health care provider. Ask your health care provider what activities are safe for you. If directed, put ice on the injured area. To do this: Put ice in a plastic bag. Place a towel between your skin and the bag. Leave the ice on for 20 minutes, 2-3 times a day. If your skin turns bright red, remove the ice right away to prevent skin damage. The risk of skin damage is higher if you cannot feel pain, heat, or cold. If directed, apply light compression to  the injured area using an elastic bandage. Make sure the bandage is not wrapped too tightly. Remove and reapply the bandage as directed by your health care provider. If possible, elevate the injured area above the level of your heart while you are sitting or lying down. General instructions Take over-the-counter and prescription medicines only as told by your health care provider. Keep all follow-up visits. Your health care provider may want to see how your contusion is healing with treatment. Contact a health care provider if: Your symptoms do not improve after several days of treatment. Your symptoms get worse. You have difficulty moving the injured area. Get help right away if: You have severe pain. You have numbness in a hand or foot. Your hand or foot turns pale or cold. This information is not intended to replace advice given to you by your health care provider. Make sure you discuss any questions you have with your health care provider. Document Revised: 08/27/2021 Document Reviewed: 08/27/2021 Elsevier Patient Education  2024 [employer].

## 2023-12-16 ENCOUNTER — Telehealth: Payer: Self-pay | Admitting: Registered Nurse

## 2023-12-16 ENCOUNTER — Encounter: Payer: Self-pay | Admitting: Registered Nurse

## 2023-12-16 DIAGNOSIS — E785 Hyperlipidemia, unspecified: Secondary | ICD-10-CM

## 2023-12-16 DIAGNOSIS — K219 Gastro-esophageal reflux disease without esophagitis: Secondary | ICD-10-CM

## 2023-12-16 DIAGNOSIS — E039 Hypothyroidism, unspecified: Secondary | ICD-10-CM

## 2023-12-16 MED ORDER — OMEPRAZOLE 40 MG PO CPDR: 40.0000 mg | DELAYED_RELEASE_CAPSULE | Freq: Every day | ORAL | Status: DC

## 2023-12-16 NOTE — Telephone Encounter (Signed)
 Patient last filled 09/04/23 90 day supply each medication. Last labs Oct 2024 with PCM. Had TSH recheck   Latest Reference Range & Units 09/10/23 10:20  TSH 0.450 - 4.500 uIU/mL 3.690   after taking correct dose levothyroxine  150mcgx 3 months   Dispensed 90 days each levothyroxine  100mcg, levothyroxine  50mcg, omeprazole  DR 40mg  and simvastatin  40mg  to patient today from [facility].  levothyroxine  take 1 100mcg daily with 50mcg tab po daily ordered for patient. Omeprazole  40mg  DR po daily #90 Rf0, levothyroxine  100mcg po daily take with 50mcg po daily #90 each dispensed to patient today and simvastatin  40mg  po daily #90. Next fasting labs due Oct 2025 with PCM.    Component Ref Range & Units 7 mo ago  TSH 0.450 - 4.50 uIU/mL 15.600 High   Resulting Agency [facility]  Narrative Performed by [facility] Performed at:  [address] [facility] [address], [place], [place]  [id number] Lab Director: Frankey Sas MD, Phone:  [phone]     Specimen Collected: 01/23/23 09:40    Performed by: HOYT Last Resulted: 01/24/23 05:35  Received From: [facility]  Result Received: 01/29/23 09:22     cimen: Blood Component Ref Range & Units 7 mo ago Comments  Cholesterol, Total 100 - 199 mg/dL 817    Triglycerides 0 - 149 mg/dL 882    HDL >60 mg/dL 57    VLDL Cholesterol Cal 5 - 40 mg/dL 21    LDL 0 - 99 mg/dL 895 High     LDL/HDL Ratio 0.0 - 3.2 ratio 1.8                                     LDL/HDL Ratio                                             Men  Women                               1/2 Avg.Risk  1.0    1.5                                   Avg.Risk  3.6    3.2                                2X Avg.Risk  6.2    5.0                                3X Avg.Risk  8.0    6.1  Resulting Agency [facility]    Narrative Performed by [facility] Performed at:  [address] [facility] [place] [address], [place], [place]  [id number] Lab Director: Frankey Sas MD, Phone:  [phone]     Specimen  Collected: 01/23/23 09:40    Performed by: HOYT Last Resulted: 01/24/23 05:35  Received From: [facility]  Result Received: 01/29/23 09:22   men: Blood Component Ref Range & Units 7 mo ago  Glucose 70 - 99 mg/dL 84  BUN 8 - 27 mg/dL 12  Creatinine 9.42 -  1.00 mg/dL 9.23  eGFR >40 fO/fpw/8.26 86  BUN/Creatinine Ratio 12 - 28 16  Sodium 134 - 144 mmol/L 133 Low   Potassium 3.5 - 5.2 mmol/L 4.9  Chloride 96 - 106 mmol/L 97  CO2 20 - 29 mmol/L 22  CALCIUM  8.7 - 10.3 mg/dL 9.5  Total Protein 6.0 - 8.5 g/dL 6.7  Albumin, Serum 3.9 - 4.9 g/dL 4.2  Globulin, Total 1.5 - 4.5 g/dL 2.5  Total Bilirubin 0.0 - 1.2 mg/dL 0.3  Alkaline Phosphatase 44 - 121 IU/L 90  AST 0 - 40 IU/L 25  ALT (SGPT) 0 - 32 IU/L 28  Resulting Agency [facility]  Narrative Performed by [employer] [facility] Performed at:  [address] [facility]  [address] [place], [place]  [id number] Lab Director: Frankey Sas MD, Phone:  [phone]     Specimen Collected: 01/23/23 09:40    Performed by: HOYT Last Resulted: 01/24/23 05:35  Received From: [facility]  Result Received: 01/29/23 09:22

## 2024-01-06 ENCOUNTER — Encounter: Payer: Self-pay | Admitting: General Practice

## 2024-01-06 ENCOUNTER — Ambulatory Visit: Payer: Self-pay | Admitting: Registered Nurse

## 2024-01-06 DIAGNOSIS — Z23 Encounter for immunization: Secondary | ICD-10-CM

## 2024-01-06 NOTE — Patient Instructions (Signed)
 Patient given printed copies of influenza and cominarity VISs covid

## 2024-01-06 NOTE — Progress Notes (Signed)
 Patient last covid and influenza vaccines 1 year ago.   Denied history of vaccine reactions in the past.  Not ill today or febrile   Feeling well.  Administered high dose flu and cominarity covid vaccines today.  No bandaids needed  No bleeding or discharge from vaccine sites patient departed ambulatory in no distress respirations even and unlabored skin warm dry and pink gait sure and steady A&Ox3  Immunization questionnaire completed by patient and kept on file at [facility].

## 2024-02-12 ENCOUNTER — Telehealth: Payer: Self-pay | Admitting: Registered Nurse

## 2024-02-12 ENCOUNTER — Encounter: Payer: Self-pay | Admitting: Registered Nurse

## 2024-02-12 DIAGNOSIS — M79672 Pain in left foot: Secondary | ICD-10-CM

## 2024-02-12 MED ORDER — MELOXICAM 7.5 MG PO TABS: 7.5000 mg | ORAL_TABLET | Freq: Every day | ORAL | 3 refills | Status: AC

## 2024-02-12 NOTE — Telephone Encounter (Signed)
 Patient reported feet feeling much better and mobic  working well for her requested refill 7.5mg  po daily #90 RF3 electronic rx sent to her pharmacy of choice.  Has annual physical scheduled with [facility] February as had to reschedule appt due to conflicts to assist parent.  Last labs  Component Ref Range & Units 1 yr ago  Glucose 70 - 99 mg/dL 84  BUN 8 - 27 mg/dL 12  Creatinine 9.42 - 8.99 mg/dL 9.23  eGFR >40 fO/fpw/8.26 86  BUN/Creatinine Ratio 12 - 28 16  Sodium 134 - 144 mmol/L 133 Low   Potassium 3.5 - 5.2 mmol/L 4.9  Chloride 96 - 106 mmol/L 97  CO2 20 - 29 mmol/L 22  CALCIUM  8.7 - 10.3 mg/dL 9.5  Total Protein 6.0 - 8.5 g/dL 6.7  Albumin, Serum 3.9 - 4.9 g/dL 4.2  Globulin, Total 1.5 - 4.5 g/dL 2.5  Total Bilirubin 0.0 - 1.2 mg/dL 0.3  Alkaline Phosphatase 44 - 121 IU/L 90  AST 0 - 40 IU/L 25  ALT (SGPT) 0 - 32 IU/L 28  Resulting Agency [facility]  Narrative Performed by [facility] Performed at:  [address] [facility] [address], [place], [place]  [id number] Lab Director: Frankey Sas MD, Phone:  [phone] Specimen Collected: 01/23/23 09:40   Performed by: HOYT Last Resulted: 01/24/23 05:35  Received From: [facility]  Result Received: 11/13/23 12:26  Last BP 140/82 11/13/23 at acute visit

## 2024-02-16 ENCOUNTER — Encounter: Payer: Self-pay | Admitting: Registered Nurse

## 2024-02-16 ENCOUNTER — Other Ambulatory Visit: Payer: Self-pay | Admitting: Registered Nurse

## 2024-02-16 DIAGNOSIS — R638 Other symptoms and signs concerning food and fluid intake: Secondary | ICD-10-CM

## 2024-02-17 ENCOUNTER — Other Ambulatory Visit: Payer: Self-pay | Admitting: General Practice

## 2024-03-16 ENCOUNTER — Encounter: Payer: Self-pay | Admitting: Registered Nurse

## 2024-03-16 ENCOUNTER — Telehealth: Payer: Self-pay | Admitting: Registered Nurse

## 2024-03-16 DIAGNOSIS — E785 Hyperlipidemia, unspecified: Secondary | ICD-10-CM

## 2024-03-16 DIAGNOSIS — K219 Gastro-esophageal reflux disease without esophagitis: Secondary | ICD-10-CM

## 2024-03-16 DIAGNOSIS — E039 Hypothyroidism, unspecified: Secondary | ICD-10-CM

## 2024-03-16 MED ORDER — OMEPRAZOLE 40 MG PO CPDR: 40.0000 mg | DELAYED_RELEASE_CAPSULE | Freq: Every day | ORAL | 3 refills | Status: AC

## 2024-03-16 MED ORDER — LEVOTHYROXINE SODIUM 100 MCG PO TABS: 100.0000 ug | ORAL_TABLET | Freq: Every day | ORAL | 0 refills | Status: AC

## 2024-03-16 MED ORDER — SIMVASTATIN 40 MG PO TABS: 40.0000 mg | ORAL_TABLET | Freq: Every day | ORAL | 0 refills | Status: AC

## 2024-03-16 MED ORDER — LEVOTHYROXINE SODIUM 50 MCG PO TABS: 50.0000 ug | ORAL_TABLET | Freq: Every day | ORAL | 0 refills | Status: AC

## 2024-03-16 NOTE — Telephone Encounter (Signed)
 Patient last filled 12/16/23 90 day supply each medication. Last labs Oct 2024 with PCM.  Next labs pending Jan 2026 with [facility] appt.  Had TSH recheck    Latest Reference Range & Units 09/10/23 10:20  TSH 0.450 - 4.500 uIU/mL 3.690   after taking correct dose levothyroxine  150mcgx 3 months   Dispensed 90 days each levothyroxine  100mcg, levothyroxine  50mcg, omeprazole  DR 40mg  and simvastatin  40mg  to patient today from [facility].  levothyroxine  take 1 100mcg daily with 50mcg tab po daily #90 RF0 ordered for patient. Omeprazole  40mg  DR po daily #90 RF0, levothyroxine  100mcg po daily take with 50mcg po daily #90 RF0 each dispensed to patient today and simvastatin  40mg  po daily #90. Patient agreed with plan of care and had no further questions at this time.   Component Ref Range & Units 7 mo ago  TSH 0.450 - 4.50 uIU/mL 15.600 High   Resulting Agency [facility]  Narrative Performed by [facility] Performed at:  [address] [facility] [place] [address], [place], [place]  [id number] Lab Director: Frankey Sas MD, Phone:  [phone]        Specimen Collected: 01/23/23 09:40    Performed by: HOYT Last Resulted: 01/24/23 05:35  Received From: [facility]  Result Received: 01/29/23 09:22      cimen: Blood Component Ref Range & Units 7 mo ago Comments  Cholesterol, Total 100 - 199 mg/dL 817    Triglycerides 0 - 149 mg/dL 882    HDL >60 mg/dL 57    VLDL Cholesterol Cal 5 - 40 mg/dL 21    LDL 0 - 99 mg/dL 895 High     LDL/HDL Ratio 0.0 - 3.2 ratio 1.8                                     LDL/HDL Ratio                                             Men  Women                               1/2 Avg.Risk  1.0    1.5                                   Avg.Risk  3.6    3.2                                2X Avg.Risk  6.2    5.0                                3X Avg.Risk  8.0    6.1  Resulting Agency [facility]    Narrative Performed by [facility] Performed at:  [address] [facility] [place] [address]  [place], [place]  [id number] Lab Director: Frankey Sas MD, Phone:  [phone]        Specimen Collected: 01/23/23 09:40    Performed by: HOYT Last Resulted: 01/24/23 05:35  Received From: [facility]  Result Received: 01/29/23 09:22  men: Blood Component Ref Range & Units 7 mo ago  Glucose 70 - 99 mg/dL 84  BUN 8 - 27 mg/dL 12  Creatinine 9.42 - 8.99 mg/dL 9.23  eGFR >40 fO/fpw/8.26 86  BUN/Creatinine Ratio 12 - 28 16  Sodium 134 - 144 mmol/L 133 Low   Potassium 3.5 - 5.2 mmol/L 4.9  Chloride 96 - 106 mmol/L 97  CO2 20 - 29 mmol/L 22  CALCIUM  8.7 - 10.3 mg/dL 9.5  Total Protein 6.0 - 8.5 g/dL 6.7  Albumin, Serum 3.9 - 4.9 g/dL 4.2  Globulin, Total 1.5 - 4.5 g/dL 2.5  Total Bilirubin 0.0 - 1.2 mg/dL 0.3  Alkaline Phosphatase 44 - 121 IU/L 90  AST 0 - 40 IU/L 25  ALT (SGPT) 0 - 32 IU/L 28  Resulting Agency [facility]  Narrative Performed by [facility] Performed at:  [address] [facility] [place] [address] [place], [place]  [id number] Lab Director: Frankey Sas MD, Phone:  [phone]        Specimen Collected: 01/23/23 09:40    Performed by: HOYT Last Resulted: 01/24/23 05:35  Received From: [facility]  Result Received: 01/29/23 09:22

## 2024-03-23 ENCOUNTER — Other Ambulatory Visit: Payer: Self-pay | Admitting: Registered Nurse

## 2024-03-23 ENCOUNTER — Encounter: Payer: Self-pay | Admitting: Registered Nurse

## 2024-03-23 ENCOUNTER — Telehealth: Payer: Self-pay | Admitting: Registered Nurse

## 2024-03-23 ENCOUNTER — Other Ambulatory Visit: Payer: Self-pay | Admitting: General Practice

## 2024-03-23 ENCOUNTER — Ambulatory Visit: Payer: Self-pay | Admitting: General Practice

## 2024-03-23 VITALS — BP 144/90 | HR 84 | Wt 196.0 lb

## 2024-03-23 DIAGNOSIS — E039 Hypothyroidism, unspecified: Secondary | ICD-10-CM

## 2024-03-23 DIAGNOSIS — Z6832 Body mass index (BMI) 32.0-32.9, adult: Secondary | ICD-10-CM

## 2024-03-23 DIAGNOSIS — R030 Elevated blood-pressure reading, without diagnosis of hypertension: Secondary | ICD-10-CM

## 2024-03-23 DIAGNOSIS — Z0000 Encounter for general adult medical examination without abnormal findings: Secondary | ICD-10-CM

## 2024-03-23 DIAGNOSIS — R7303 Prediabetes: Secondary | ICD-10-CM

## 2024-03-23 NOTE — Progress Notes (Signed)
 Victoria Morales presents to the clinic this am for [facility] lab work and assessment. She is hypertensive, reports not taking any medications for blood pressure. Information completed, will follow up with results.

## 2024-03-23 NOTE — Telephone Encounter (Signed)
 Patient to clinic asking me to review her lab results tomorrow when available and to discuss in detail with her send my chart message due to elevated blood pressure and weight gain over the past year was 196lbs today and at [facility] Jun 2025 179lbs.  Has been eating late due to partner taking a long time to cook dinner typically at 9pm.  If she doesn't eat normal portion size then partner thinks she doesn't like food when she does.  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  Thyroid  was high earlier this year wants to ensure level still normal now   Patient given [employer] dietitian information text or call [phone] to schedule appt typically covered by insurance.  Patient stated she would consider.  Also discussed weight loss, decrease added salt in diet and increase potassium rich foods.   banana, avocado, spinach, potatoes, sweet potatoes, orange, cantaloupe, tomatoes, yogurt, lentils, dried apricots, salmon, beans, rasisins, watermelon, broccoli, navy beans, swiss chard, beets, brussel sprouts, peas, acord squash, prunes, pumpkin, guava, kiwi fruit, pomegranate, and cherries.  Patient stated she doesn't add salt to foods.  Bananas cause stomach pain but will try increasing some of the other foods in diet to help with blood pressure until weight loss.  Reviewed previous blood pressure at appts this year 140/90 typical and previous year 118/80 when weighed 179lbs  Patient verbalized understanding information/instructions, agreed with plan of care and had no further questions at this time.

## 2024-03-24 ENCOUNTER — Ambulatory Visit: Payer: Self-pay | Admitting: Registered Nurse

## 2024-03-24 DIAGNOSIS — Z6832 Body mass index (BMI) 32.0-32.9, adult: Secondary | ICD-10-CM

## 2024-03-24 DIAGNOSIS — R7303 Prediabetes: Secondary | ICD-10-CM

## 2024-03-24 DIAGNOSIS — E871 Hypo-osmolality and hyponatremia: Secondary | ICD-10-CM

## 2024-03-24 DIAGNOSIS — E7800 Pure hypercholesterolemia, unspecified: Secondary | ICD-10-CM

## 2024-03-24 DIAGNOSIS — R030 Elevated blood-pressure reading, without diagnosis of hypertension: Secondary | ICD-10-CM

## 2024-03-24 LAB — CMP12+LP+TP+TSH+6AC+CBC/D/PLT
ALT: 31 IU/L (ref 0–32)
AST: 24 IU/L (ref 0–40)
Albumin: 4.1 g/dL (ref 3.9–4.9)
Alkaline Phosphatase: 85 IU/L (ref 49–135)
BUN/Creatinine Ratio: 19 (ref 12–28)
BUN: 14 mg/dL (ref 8–27)
Basophils Absolute: 0.1 x10E3/uL (ref 0.0–0.2)
Basos: 1 %
Bilirubin Total: 0.4 mg/dL (ref 0.0–1.2)
Calcium: 9.2 mg/dL (ref 8.7–10.3)
Chloride: 95 mmol/L — ABNORMAL LOW (ref 96–106)
Chol/HDL Ratio: 3.5 ratio (ref 0.0–4.4)
Cholesterol, Total: 190 mg/dL (ref 100–199)
Creatinine, Ser: 0.73 mg/dL (ref 0.57–1.00)
EOS (ABSOLUTE): 0.3 x10E3/uL (ref 0.0–0.4)
Eos: 4 %
Estimated CHD Risk: 0.5 times avg. (ref 0.0–1.0)
Free Thyroxine Index: 2.1 (ref 1.2–4.9)
GGT: 24 IU/L (ref 0–60)
Globulin, Total: 2.5 g/dL (ref 1.5–4.5)
Glucose: 88 mg/dL (ref 70–99)
HDL: 55 mg/dL
Hematocrit: 41.2 % (ref 34.0–46.6)
Hemoglobin: 13.3 g/dL (ref 11.1–15.9)
Immature Grans (Abs): 0 x10E3/uL (ref 0.0–0.1)
Immature Granulocytes: 0 %
Iron: 96 ug/dL (ref 27–139)
LDH: 200 IU/L (ref 119–226)
LDL Chol Calc (NIH): 113 mg/dL — ABNORMAL HIGH (ref 0–99)
Lymphocytes Absolute: 1.7 x10E3/uL (ref 0.7–3.1)
Lymphs: 25 %
MCH: 30.4 pg (ref 26.6–33.0)
MCHC: 32.3 g/dL (ref 31.5–35.7)
MCV: 94 fL (ref 79–97)
Monocytes Absolute: 0.6 x10E3/uL (ref 0.1–0.9)
Monocytes: 9 %
Neutrophils Absolute: 4 x10E3/uL (ref 1.4–7.0)
Neutrophils: 61 %
Phosphorus: 4 mg/dL (ref 3.0–4.3)
Platelets: 235 x10E3/uL (ref 150–450)
Potassium: 4.9 mmol/L (ref 3.5–5.2)
RBC: 4.37 x10E6/uL (ref 3.77–5.28)
RDW: 11.8 % (ref 11.7–15.4)
Sodium: 131 mmol/L — ABNORMAL LOW (ref 134–144)
T3 Uptake Ratio: 27 % (ref 24–39)
T4, Total: 7.9 ug/dL (ref 4.5–12.0)
TSH: 1.51 u[IU]/mL (ref 0.450–4.500)
Total Protein: 6.6 g/dL (ref 6.0–8.5)
Triglycerides: 124 mg/dL (ref 0–149)
Uric Acid: 4.5 mg/dL (ref 3.0–7.2)
VLDL Cholesterol Cal: 22 mg/dL (ref 5–40)
WBC: 6.6 x10E3/uL (ref 3.4–10.8)
eGFR: 90 mL/min/1.73

## 2024-03-24 LAB — HEMOGLOBIN A1C
Est. average glucose Bld gHb Est-mCnc: 123 mg/dL
Hgb A1c MFr Bld: 5.9 % — ABNORMAL HIGH (ref 4.8–5.6)

## 2024-03-30 NOTE — Progress Notes (Signed)
 "  Patient:   Victoria Morales     DOB: March 12, 1956, age 69 y.o. MRN: [id number]  PCP: Bernita Ned, PA-C  Computer technology was used to create visit note. Consent from the patient/caregiver was obtained prior to its use.     Assessment and Plan   1. Annual physical exam (Primary) Comments: Age-appropriate health guidance provided. 2. Screening for colon cancer Comments: Colonoscopy in 03/2022.  Will reach out to her specialist for records. 3. Estrogen deficiency -     Dexa Bone Density Axial Skeleton; Future; Expected date: 04/27/2024 4. Encounter for screening mammogram for breast cancer Comments: Scheduled for later this month. 5. Hyperlipidemia, unspecified hyperlipidemia type Assessment & Plan: Stable and controlled.  Continue simvastatin  and healthy lifestyle modifications. 6. Hypothyroidism, unspecified type Assessment & Plan: Clinically euthyroid and TSH normal at recent screening.  Continue levothyroxine  100 mcg daily. 7. Prediabetes Assessment & Plan: A1c up a very small amount, now 5.9%.  Patient plans to implement healthy lifestyle modification to bring it back down.  No medication indicated at this time. 8. Adenomatous polyp of transverse colon Assessment & Plan: 8 mm sessile polyp on colonoscopy 03/29/2022.  Recall 5 years. 9. PTSD (post-traumatic stress disorder) Assessment & Plan: Stable and controlled on current treatment.  We discussed the possibility of tapering off some of her medication, but as she is doing well, tolerating the medications without side effects, we elected to continue for now. Orders: -     paroxetine (PAXIL) 40 MG tablet; Take one tablet (40 mg dose) by mouth daily., Starting Tue 03/30/2024, Normal    Assessment & Plan 1. Annual examination: - Hemoglobin A1c is within the prediabetic range at 5.9, indicating a need for lifestyle modifications to prevent progression to diabetes. LDL cholesterol is slightly elevated, placing her at a  borderline risk for cardiovascular events over the next decade. - Thyroid  function tests, electrolyte levels, and complete blood count are all within normal limits. Blood pressure readings are within the normal range, although there was a previous instance of elevated blood pressure at work (144/90). Pulse rate is marginally elevated, potentially due to anxiety related to the visit. - She has completed her colonoscopy in 2023 and is due for a mammogram later this month. A bone density test will be ordered. - She is advised to incorporate lean protein and non-starchy vegetables into her diet, engage in regular physical activity, and focus on building lean muscle mass. She is encouraged to maintain healthy behaviors rather than focusing on weight loss. She is advised to allow soapy water to enter her ear canal during hair washing to facilitate cerumen dissolution. If she experiences a sensation of blocked ears, she can use a mixture of equal parts water and hydrogen peroxide, ensuring not to use undiluted hydrogen peroxide as it can cause skin burns. For itchy ears, she can use sweet oil drops available at the pharmacy.  2. PTSD: - She is currently on clomipramine, paroxetine, and quetiapine for PTSD. A prescription for paroxetine will be sent to her pharmacy.  Follow-up: The patient will follow up in 1 year or sooner if needed.    Follow up in about 1 year (around 03/30/2025) for Annual Exam, sooner if needed.  Risks, benefits, and alternatives of the medications and treatment plan prescribed today were discussed, and patient expressed understanding. Plan follow-up as discussed or as needed if any worsening symptoms or change in condition.  Patient voiced understanding of the treatment plan and agreed to attempt to comply.  Subjective   This is a 69 y.o. female who presents with:     Patient presents with   Annual Exam     Chief complaint comments reviewed and discussed with patient in  detail.  HPI   This 69 y.o. female presents for Annual Exam.  Be Well labs were drawn at her work through an employer agreement with another health system.  Results are reviewed and abstracted.  Cervical Cancer Screening: No longer candidate.  Breast Cancer Screening: Due soon. Last mammogram 04/23/2023, normal.  Colorectal Cancer Screening: Current.  Colonoscopy 03/2022.  5-year recall due to adenomatous polyps. Bone Density Testing: Eligible. HIV Screening: Very low risk.STI Screening: Very low risk. COVID-19 Vaccination: Current. Seasonal Influenza Vaccination: Current. Td/Tdap Vaccination: Current.  Tdap 05/23/2015. Pneumococcal Vaccination: Current.  Prevnar 20 on 12/04/2021. Zoster Vaccination: Current.  Has received 2 doses of Shingrix. RSV vaccination: Not yet a candidate. Frequency of Dental evaluation: Q6 months Frequency of Eye evaluation: annually   History of Present Illness The patient is a 69 year old woman who presents today for an annual exam.  She has completed her wellness labs through her workplace, which revealed slightly elevated LDL cholesterol levels. Her hemoglobin A1c was 5.9, placing her in the prediabetes range. She plans to increase her physical activity and make healthier dietary choices. She reports high blood pressure readings at work, with a noted value of 144/90, although her blood pressure is normal during today's visit. Her thyroid  function and electrolytes are normal, and her blood count is also normal.  She has undergone a colonoscopy in 2023/2024 and is due for a mammogram later this month. She does not engage in poor eating habits but admits to skipping lunch and eating late at night. She is considering reducing her antidepressant medication but is uncertain about their individual efficacy as she has been on them for an extended period. She takes levothyroxine  at night, a practice she has maintained since her previous physician's care. She requests  refills for her medications, excluding those for thyroid  and acid reflux, which are provided by her employer.  Occupation: Inventory control Diet: Skips lunch, eats late at night Coffee/Tea/Caffeine-containing Drinks: Drinks coffee daily  PAST SURGICAL HISTORY: Colonoscopy in 2024   Review of systems: Constitutional: (-) Fatigue, (-) Unexpected weight change, (-) Fever, (-) Appetite change ENT:  (+) Tinnitus, (-) Trouble swallowing Eyes: (-) Photophobia, (-) Visual disturbance Lungs: (-) Cough, (-) Shortness of breath, (-) Wheezing,(-) Hemoptysis Heart: (-) Chest pain, (-) Leg swelling, (-) Palpitations Gastrointestinal: (-) Abdominal pain, (-) Nausea,  (+) Constipation, (-) Diarrhea,  (-) Blood in stool Endocrine: (-) Polydipsia (-) Polyuria Genitourinary: (-) Urinary urgency,  (-) Dyspareunia,  (-) Difficulty urinating Musculoskeletal: (-) Arthralgia,  (-) Myalgia Skin: (-) Rash,  (-) Skin color change,  (-) Wound Allergy: (-) Environmental allergies (-) Food allergies Neurologic: (-) Weakness, (-) Numbness,  (-) Sleep disturbance, (-) Headache Hematology: (-) Adenopathy,  (-) Easy bruising or bleeding Psychiatric: (-) Dysphoria,  (-) Anxious      Problem List[1]  Past Medical History:  Diagnosis Date   Acid reflux    Anxiety 1980   Resolved   Depression    Environmental and seasonal allergies 12/04/2021   Epidermoid cyst    neck   Hemorrhoids ?   Hyperlipidemia    Hypothyroid    Murmur 06/23/2015   Prediabetes    Tremor    hands    Allergies[2]  Past Surgical History:  Procedure Laterality Date   Cervical polypectomy  11/2006  Cholecystectomy  06/2002   Inner ear surgery     Soft tissue cyst excision  01/05/2016   posterior neck, benign, Elon Pacini, MD    Family History[3]  Social History[4]    Objective   Vitals:   03/30/24 1624  BP: 124/68  Patient Position: Sitting  Pulse: 101  Temp: 97.8 F (36.6 C)  TempSrc: Temporal   Height: 5' 5 (1.651 m)  Weight: 196 lb 12.8 oz (89.3 kg)  SpO2: 95%  BMI (Calculated): 32.7    Physical Exam Constitutional:      General: She is not in acute distress.    Appearance: Normal appearance. She is well-developed.  HENT:     Head: Normocephalic and atraumatic.     Right Ear: Hearing, tympanic membrane, ear canal and external ear normal.     Left Ear: Hearing, tympanic membrane, ear canal and external ear normal.     Nose: Nose normal.     Mouth/Throat:     Mouth: Mucous membranes are moist. No oral lesions.     Dentition: Normal dentition.     Pharynx: Oropharynx is clear. Uvula midline.  Eyes:     General: Lids are normal. No scleral icterus.       Right eye: No discharge or hordeolum.        Left eye: No discharge or hordeolum.     Conjunctiva/sclera: Conjunctivae normal.     Right eye: No exudate.    Left eye: No exudate.    Pupils: Pupils are equal, round, and reactive to light.     Funduscopic exam:    Right eye: No hemorrhage, exudate or papilledema.        Left eye: No hemorrhage, exudate or papilledema.  Neck:     Thyroid : No thyroid  mass or thyromegaly.     Trachea: Phonation normal.  Cardiovascular:     Rate and Rhythm: Normal rate and regular rhythm. No extrasystoles are present.    Pulses:          Radial pulses are 2+ on the right side and 2+ on the left side.     Heart sounds: Normal heart sounds. No murmur heard.    No friction rub. No gallop.  Musculoskeletal:     Cervical back: Full passive range of motion without pain, normal range of motion and neck supple.     Thoracic back: Normal.     Lumbar back: Normal.     Right hip: Normal.     Left hip: Normal.     Right knee: Normal.     Left knee: Normal.     Right ankle: Normal.     Left ankle: Normal.  Pulmonary:     Effort: Pulmonary effort is normal.     Breath sounds: Normal breath sounds.  Chest:  Breasts:    Breasts are symmetrical.     Right: No inverted nipple, mass, nipple  discharge, skin change or tenderness.     Left: No inverted nipple, mass, nipple discharge, skin change or tenderness.  Abdominal:     General: Bowel sounds are normal.     Palpations: Abdomen is soft.     Tenderness: There is no abdominal tenderness.     Hernia: No hernia is present.  Lymphadenopathy:     Head:     Right side of head: No submental, submandibular or tonsillar adenopathy.     Left side of head: No submental, submandibular or tonsillar adenopathy.     Cervical: No cervical adenopathy.  Upper Body:     Right upper body: No supraclavicular adenopathy.     Left upper body: No supraclavicular adenopathy.  Skin:    General: Skin is warm and dry.     Findings: No rash.  Neurological:     General: No focal deficit present.     Mental Status: She is alert and oriented to person, place, and time.     Cranial Nerves: No cranial nerve deficit.     Sensory: No sensory deficit.     Deep Tendon Reflexes:     Reflex Scores:      Bicep reflexes are 2+ on the right side and 2+ on the left side.      Patellar reflexes are 2+ on the right side and 2+ on the left side. Psychiatric:        Attention and Perception: Attention and perception normal.        Mood and Affect: Mood and affect normal.        Speech: Speech normal.        Behavior: Behavior normal. Behavior is cooperative.        Thought Content: Thought content normal.        Cognition and Memory: Cognition and memory normal.        Judgment: Judgment normal.     Results for orders placed or performed in visit on 03/30/24  Lipid Panel   Collection Time: 03/23/24 12:00 AM  Result Value Ref Range   Cholesterol 190 100 - 199 mg/dL   HDL 55 >=60 mg/dl   Triglycerides 875 0 - 149 mg/dL   LDL 886 (A) 0 - 99 mg/dL  Hematocrit   Collection Time: 03/23/24 12:00 AM  Result Value Ref Range   Hematocrit 41.2 35.8 - 47.9 %  Hemoglobin   Collection Time: 03/23/24 12:00 AM  Result Value Ref Range   HGB 13.3 12.2 - 14.9  g/dL  CBC   Collection Time: 03/23/24 12:00 AM  Result Value Ref Range   Platelet Count 235 150 - 400 x 10^3/uL   WBC 6.6 4.5 - 11.0 x 10^3/uL  Calcium    Collection Time: 03/23/24 12:00 AM  Result Value Ref Range   Calcium  9.2 8.5 - 10.6 mg/dL  Creatinine, Serum   Collection Time: 03/23/24 12:00 AM  Result Value Ref Range   Creatinine, POC 0.7 (A) 0.8 - 1.3 mg/dL  Glucose, Random   Collection Time: 03/23/24 12:00 AM  Result Value Ref Range   Glucose, POC 88 65 - 99 mg/dL  Hemoglobin J8r   Collection Time: 03/23/24 12:00 AM  Result Value Ref Range   Hemoglobin A1c 5.9 (A) 4.8 - 5.6 %  Potassium   Collection Time: 03/23/24 12:00 AM  Result Value Ref Range   Potassium 4.9 3.5 - 5.5 mmol/L  Sodium   Collection Time: 03/23/24 12:00 AM  Result Value Ref Range   Sodium 131 (A) 135 - 148 mEg/L  TSH   Collection Time: 03/23/24 12:00 AM  Result Value Ref Range   TSH 1.510 0.340 - 5.600 mcIU/mL  AST   Collection Time: 03/23/24 12:00 AM  Result Value Ref Range   AST (SGOT) 24 14 - 59 U/L  ALT   Collection Time: 03/23/24 12:00 AM  Result Value Ref Range   ALT 31 13 - 69 U/L  BUN   Collection Time: 03/23/24 12:00 AM  Result Value Ref Range   BUN 14 5 - 26 mg/dL     Bernita CANDIE Ned, PA-C          [  1] Patient Active Problem List Diagnosis   PTSD (post-traumatic stress disorder)   BMI 31.0-31.9,adult   Essential tremor   GERD (gastroesophageal reflux disease)   Hyperlipidemia   Hypothyroidism   Murmur   Prediabetes   Allergic rhinitis   Environmental and seasonal allergies   Tinnitus of right ear   Adenomatous polyp of transverse colon  [2] Allergies Allergen Reactions   Penicillins Rash    Has patient had a PCN reaction causing immediate rash, facial/tongue/throat swelling, SOB or lightheadedness with hypotension: No Has patient had a PCN reaction causing severe rash involving mucus membranes or skin necrosis: No Has patient had a PCN  reaction that required hospitalization No Has patient had a PCN reaction occurring within the last 10 years: No If all of the above answers are NO, then may proceed with Cephalosporin use.  [3] Family History Problem Relation Name Age of Onset   Breast cancer Paternal Aunt      Paternal Aunt      Sister  53   Diabetes Mother Nichole LOISE [place]     Sister Boby Laine    Heart failure Father          pacemaker   Hyperlipidemia Sister Boby Laine    Hypertension Sister Boby Laine   [4] Social History Socioeconomic History   Marital status: Significant Other    Spouse name: Jan   Number of children: 0   Highest education level: Master's degree (e.g., MA, MS, MEng, MEd, MSW, MBA)  Occupational History   Occupation: Pharmacist, [employer]  Tobacco Use   Smoking status: Never    Passive exposure: Never   Smokeless tobacco: Never  Vaping Use   Vaping status: Never Used  Substance and Sexual Activity   Alcohol use: Not Currently   Drug use: Never   Sexual activity: Yes    Partners: Female    Birth control/protection: Abstinence, None  Social History Narrative   Lives with her partner.   Father and sisters live in [place].  "

## 2024-03-30 NOTE — Telephone Encounter (Signed)
 Patient stated she has reviewed her my chart results note and did not have further questions but states I am going to have difficult time with my partner to change dinner meal or cause conflict with her.  Discussed with patient she can use my NP recommended instead of saying I have to do this or consider joint appt with dietitian.  Patient stated she will work on diet and exercise and follow up as instructed.  Reiterated thyroid  function was normal at time of labs.  Patient verbalized understanding information and had no further questions at that time.
